# Patient Record
Sex: Female | Born: 1980 | Race: White | Hispanic: No | Marital: Married | Smoking: Never smoker
Health system: Southern US, Community
[De-identification: ages and names within clinical notes are randomized; demographics above are authoritative.]

## PROBLEM LIST (undated history)

## (undated) DIAGNOSIS — F32A Depression, unspecified: Secondary | ICD-10-CM

## (undated) DIAGNOSIS — K625 Hemorrhage of anus and rectum: Secondary | ICD-10-CM

## (undated) DIAGNOSIS — F329 Major depressive disorder, single episode, unspecified: Secondary | ICD-10-CM

## (undated) DIAGNOSIS — N6452 Nipple discharge: Secondary | ICD-10-CM

## (undated) DIAGNOSIS — G10 Huntington's disease: Secondary | ICD-10-CM

## (undated) DIAGNOSIS — F988 Other specified behavioral and emotional disorders with onset usually occurring in childhood and adolescence: Secondary | ICD-10-CM

## (undated) DIAGNOSIS — Z87410 Personal history of cervical dysplasia: Secondary | ICD-10-CM

## (undated) DIAGNOSIS — C801 Malignant (primary) neoplasm, unspecified: Secondary | ICD-10-CM

## (undated) DIAGNOSIS — G3184 Mild cognitive impairment, so stated: Secondary | ICD-10-CM

## (undated) DIAGNOSIS — G44229 Chronic tension-type headache, not intractable: Secondary | ICD-10-CM

## (undated) DIAGNOSIS — G030 Nonpyogenic meningitis: Secondary | ICD-10-CM

## (undated) DIAGNOSIS — M542 Cervicalgia: Secondary | ICD-10-CM

## (undated) DIAGNOSIS — K219 Gastro-esophageal reflux disease without esophagitis: Secondary | ICD-10-CM

## (undated) DIAGNOSIS — R51 Headache: Secondary | ICD-10-CM

## (undated) DIAGNOSIS — R413 Other amnesia: Secondary | ICD-10-CM

## (undated) DIAGNOSIS — R569 Unspecified convulsions: Secondary | ICD-10-CM

## (undated) DIAGNOSIS — G894 Chronic pain syndrome: Secondary | ICD-10-CM

## (undated) DIAGNOSIS — G4701 Insomnia due to medical condition: Secondary | ICD-10-CM

## (undated) DIAGNOSIS — F419 Anxiety disorder, unspecified: Secondary | ICD-10-CM

## (undated) DIAGNOSIS — K6289 Other specified diseases of anus and rectum: Secondary | ICD-10-CM

## (undated) DIAGNOSIS — F319 Bipolar disorder, unspecified: Secondary | ICD-10-CM

## (undated) HISTORY — DX: Bipolar disorder, unspecified: F31.9

## (undated) HISTORY — DX: Personal history of cervical dysplasia: Z87.410

## (undated) HISTORY — DX: Chronic tension-type headache, not intractable: G44.229

## (undated) HISTORY — DX: Nonpyogenic meningitis: G03.0

## (undated) HISTORY — DX: Cervicalgia: M54.2

## (undated) HISTORY — DX: Headache: R51

## (undated) HISTORY — DX: Huntington's disease: G10

## (undated) HISTORY — DX: Other amnesia: R41.3

## (undated) HISTORY — DX: Chronic pain syndrome: G89.4

## (undated) HISTORY — DX: Unspecified convulsions: R56.9

## (undated) HISTORY — DX: Hemorrhage of anus and rectum: K62.5

## (undated) HISTORY — DX: Other specified diseases of anus and rectum: K62.89

## (undated) HISTORY — DX: Anxiety disorder, unspecified: F41.9

## (undated) HISTORY — DX: Mild cognitive impairment, so stated: G31.84

## (undated) HISTORY — DX: Insomnia due to medical condition: G47.01

## (undated) HISTORY — DX: Gastro-esophageal reflux disease without esophagitis: K21.9

## (undated) HISTORY — DX: Malignant (primary) neoplasm, unspecified: C80.1

---
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
## 2004-10-06 ENCOUNTER — Emergency Department: Payer: Self-pay | Admitting: Emergency Medicine

## 2004-11-13 ENCOUNTER — Emergency Department: Payer: Self-pay | Admitting: Emergency Medicine

## 2005-02-10 HISTORY — PX: LEEP: SHX91

## 2007-02-25 ENCOUNTER — Ambulatory Visit: Payer: Self-pay | Admitting: Internal Medicine

## 2009-09-07 ENCOUNTER — Ambulatory Visit: Payer: Self-pay | Admitting: Gynecology

## 2009-09-10 ENCOUNTER — Ambulatory Visit: Payer: Self-pay | Admitting: Gynecology

## 2009-11-21 ENCOUNTER — Ambulatory Visit: Payer: Self-pay | Admitting: Internal Medicine

## 2010-02-10 HISTORY — PX: CHOLECYSTECTOMY: SHX55

## 2010-02-26 ENCOUNTER — Inpatient Hospital Stay: Payer: Self-pay | Admitting: Psychiatry

## 2010-06-26 ENCOUNTER — Ambulatory Visit: Payer: Self-pay | Admitting: Gastroenterology

## 2010-06-27 ENCOUNTER — Observation Stay: Payer: Self-pay | Admitting: Surgery

## 2010-07-02 LAB — PATHOLOGY REPORT

## 2010-08-26 ENCOUNTER — Ambulatory Visit: Payer: Self-pay | Admitting: Gastroenterology

## 2010-10-12 ENCOUNTER — Ambulatory Visit: Payer: Self-pay | Admitting: Physician Assistant

## 2011-02-11 HISTORY — PX: ABDOMINAL HYSTERECTOMY: SHX81

## 2011-04-29 ENCOUNTER — Ambulatory Visit: Payer: Self-pay | Admitting: Obstetrics and Gynecology

## 2011-04-29 LAB — CBC
HCT: 37.4 % (ref 35.0–47.0)
HGB: 12.9 g/dL (ref 12.0–16.0)
MCHC: 34.4 g/dL (ref 32.0–36.0)
MCV: 97 fL (ref 80–100)
RBC: 3.85 10*6/uL (ref 3.80–5.20)
WBC: 9.9 10*3/uL (ref 3.6–11.0)

## 2011-05-05 ENCOUNTER — Ambulatory Visit: Payer: Self-pay | Admitting: Obstetrics and Gynecology

## 2011-05-05 LAB — PREGNANCY, URINE: Pregnancy Test, Urine: NEGATIVE m[IU]/mL

## 2011-06-04 ENCOUNTER — Ambulatory Visit: Payer: Self-pay | Admitting: Obstetrics and Gynecology

## 2011-06-04 LAB — CBC
HCT: 39.8 % (ref 35.0–47.0)
HGB: 13.5 g/dL (ref 12.0–16.0)
Platelet: 307 10*3/uL (ref 150–440)
RDW: 12.5 % (ref 11.5–14.5)

## 2011-06-04 LAB — PREGNANCY, URINE: Pregnancy Test, Urine: NEGATIVE m[IU]/mL

## 2011-06-09 ENCOUNTER — Ambulatory Visit: Payer: Self-pay | Admitting: Obstetrics and Gynecology

## 2011-06-09 LAB — PREGNANCY, URINE: Pregnancy Test, Urine: NEGATIVE m[IU]/mL

## 2011-06-12 LAB — PATHOLOGY REPORT

## 2013-06-21 DIAGNOSIS — K6289 Other specified diseases of anus and rectum: Secondary | ICD-10-CM | POA: Insufficient documentation

## 2013-06-21 DIAGNOSIS — K625 Hemorrhage of anus and rectum: Secondary | ICD-10-CM | POA: Insufficient documentation

## 2014-06-04 NOTE — Op Note (Signed)
PATIENT NAME:  [name], Heather King MR#:  [id number] DATE OF BIRTH:  01-07-1981  DATE OF PROCEDURE:  06/09/2011  PREOPERATIVE DIAGNOSES:  1. Chronic pelvic pain.  2. Endometriosis.   POSTOPERATIVE DIAGNOSES:  1. Chronic pelvic pain.  2. Endometriosis.   OPERATIVE PROCEDURE: Transvaginal hysterectomy.   SURGEON: Alanda Slim. Jaking Thayer, M.D.   FIRST ASSISTANT: None.   ANESTHESIA: General endotracheal.   INDICATIONS: Heather King is a 34 year old married white female, para 1- 0-0-1, who presents for definitive surgery through transvaginal hysterectomy for symptomatic endometriosis. The patient has had chronic pelvic pain with dyspareunia which has been affecting her activities of daily living. She and her husband desire no further children and are willing to proceed with definitive surgery.   FINDINGS AT SURGERY: Small uterus that was normal in overall appearance. The ovaries appeared grossly normal. The pelvis was vascular with a moderate amount of bleeding. The vaginal vault had good support.   DESCRIPTION OF PROCEDURE: The patient was brought to the operating room where she was placed in the supine position. General endotracheal anesthesia was induced without difficulty. She was placed in the dorsal lithotomy position using the candy-cane stirrups. A Betadine perineal/intravaginal prep and drape was performed in the standard fashion. A Foley catheter was placed and the bladder was draining clear yellow urine. A weighted speculum was placed into the vagina and a double-tooth tenaculum was placed onto the cervix. The posterior colpotomy was made with Mayo scissors. The uterosacral ligaments were then clamped, cut, and stick tied using 0 Vicryl suture. These were tagged. Cervix was then circumscribed and the bladder was dissected off the lower uterine segment through sharp and blunt dissection. Sequentially the cardinal/broad ligament complexes were then clamped, cut, and stick tied using 0 Vicryl  suture. This was carried out up to the level of the uterine cornu. Eventually the anterior cul-de-sac was entered. The utero-ovarian ligaments were then crossclamped, cut, and doubly ligated using 0 Vicryl suture. The first tie was a free tie with the following tie being a stick tie. These were tagged. There was some mild bleeding at the uteroovarian pedicles bilaterally and additional sutures of 0 Vicryl were placed to optimize hemostasis. The vaginal cuff was reapproximated using a 0 chromic suture in a baseball stitch manner. Peritoneum was reapproximated using a pursestring stitch of 0 Vicryl. Vagina was then reapproximated using 2-0 chromic sutures in a simple interrupted manner. Upon completion of the procedure, all instrumentation was removed from the vagina. The patient was then mobilized, awakened, and taken to the recovery room in satisfactory condition.  Estimated blood loss was 350 mL. IV fluids were 1300 mL of crystalloid. Urine output was 375 mL of clear urine.    ____________________________ Alanda Slim Wilkin Lippy, MD mad:bjt D: 06/09/2011 14:41:35 ET T: 06/09/2011 16:10:34 ET JOB#: [id number]  cc: Hassell Done A. Tynetta Bachmann, MD, <Dictator> Alanda Slim Demari Gales MD ELECTRONICALLY SIGNED 06/16/2011 22:38

## 2014-06-04 NOTE — H&P (Signed)
PATIENT NAME:  Heather King, Heather King MR#:  [id number] DATE OF BIRTH:  1980/03/10  DATE OF ADMISSION:  06/09/2011  PREOPERATIVE DIAGNOSIS: Symptomatic endometriosis.  HISTORY OF PRESENT ILLNESS: Heather King is a 34 year old married white female, para 1-0-0-1, on Seasonique for contraception and cycle regulation, who presents for definitive surgery for symptomatic endometriosis. The patient has had a long history of chronic pelvic pain manifested by severe dysmenorrhea and deep thrust dyspareunia. Laparoscopy in March of 2013 revealed endometriosis through visual diagnosis with red and black lesions being seen in the cul-de-sac. Pseudo fenestrations were also present as well. Pathology showed some myomatous calcifications without evidence of endometriosis. Because of her pain syndrome, she would like to proceed with definitive surgery. Leeana does have a family history of ovarian cancer in mom. The ovaries appeared grossly normal along with the fallopian tubes at the time of laparoscopy and at this time she would like to maintain ovarian conservation.   PAST MEDICAL HISTORY:  1. Mild anxiety.  2. Dysmenorrhea.  3. Menorrhagia.  4. Cervical dysplasia.  5. Endometriosis.   PAST SURGICAL HISTORY:  1. LEEP in 2007. 2. LEEP in 2012.  3. Laparoscopic cholecystectomy in 2012.  4. Laparoscopy with peritoneal biopsies in 2013.   PAST OB HISTORY: Para 1-0-0-1. Spontaneous vaginal delivery x1 with infant being 9 pounds, 11 ounces.   PAST GYN HISTORY: Cycles are regular with menorrhagia and severe dysmenorrhea. She does have deep thrust dyspareunia. The patient cannot tolerate NuvaRing and Mirena IUD due to side effects.   FAMILY HISTORY: Negative for cancer of the colon. There is history of ovarian cancer in mom who was diagnosed at age 2. There is breast cancer in maternal aunt.   SOCIAL HISTORY: The patient does not smoke. She does drink alcohol socially. She denies drug use. She works as a Research scientist (physical sciences) in the  [employer]   REVIEW OF SYSTEMS: The patient denies recent illness. She denies history of coagulopathy. She denies reactive airway disease.   PHYSICAL EXAMINATION:   VITAL SIGNS: Height 5 feet 2 inches, weight 138, blood pressure 123/76, heart rate 89, and BMI 25.   GENERAL: The patient is a pleasant well-appearing white female with normal affect. She is alert and oriented.   HEENT: Normocephalic, atraumatic. Oropharynx clear.   NECK: Supple. No thyromegaly or adenopathy.   LUNGS: Clear.   HEART: Regular rate and rhythm without murmur.   BREASTS: Not examined.   FLANKS: Without CVA tenderness.   ABDOMEN: Soft, flat, and nontender. No organomegaly. No pelvic masses. No hernias.   PELVIC: External genitalia normal. BUS normal. Vagina has good vault support. Cervix is parous and there is 2 out of 4 cervical motion tenderness. Uterus is midplane, top normal size and mobile. It is 2 out of 4 tender. Adnexa are nonpalpable, nontender.   RECTAL: External rectal exam is normal.   EXTREMITIES: Without clubbing, cyanosis, or edema.   SKIN: Without rash.   MUSCULOSKELETAL: Normal.  IMPRESSION: Symptomatic endometriosis.   PLAN: Transvaginal hysterectomy. Date of surgery is 06/09/2011.   CONSENT NOTE: Heather King is to undergo TVH for symptomatic endometriosis. She is understanding of the planned procedures and is aware of and is accepting of all surgical risks which include but are not limited to bleeding, infection, pelvic organ injury with need for repair, blood clot disorders, anesthesia risks, nerve injury, and death. All questions are answered. Informed consent is given. The patient is ready and willing to proceed with surgery as scheduled. ____________________________ Alanda Slim Jibreel Fedewa,  MD mad:slb D: 06/04/2011 11:22:29 ET T: 06/04/2011 11:32:50 ET JOB#: [id number]  cc: Hassell Done A. Jeorgia Helming, MD, <Dictator> Alanda Slim Marykathryn Carboni  MD ELECTRONICALLY SIGNED 06/06/2011 12:24

## 2014-06-04 NOTE — H&P (Signed)
PATIENT NAME:  Heather King, RHOME MR#:  [id number] DATE OF BIRTH:  12/18/1980  DATE OF ADMISSION:  05/05/2011  PREOPERATIVE DIAGNOSES:  1. Abdominal pelvic pain. 2. Dyspareunia.  3. Dysmenorrhea.   HISTORY: Aide Wojnar is a 34 year old recently remarried white female, para 1-0-0-1, last menstrual period uncertain, using Seasonique for contraception and cycle regulation, presents for surgical evaluation of chronic pelvic pain that has been affecting activities of daily living. The patient does report a long history of severe dysmenorrhea with perimenstrual low back ache that radiates into the legs. She also has had a long history of deep dyspareunia. She states the symptoms are severe enough to affect her relations with her spouse. The patient does have long history of subjective menorrhagia off of oral contraceptives with bleeding that could be upwards of 10-14 days. She did have history of cervical dysplasia that required LEEP procedures in 2007 and 2012. Because of the ongoing pain issues, she desires surgical evaluation to rule out endometriosis. She is not interested in having future children, as she and her current husband have 5 children total. In addition, the patient does have family history of ovarian cancer in mom, being diagnosed at age 106.   PAST MEDICAL HISTORY:  1. Mild anxiety. 2. Dysmenorrhea. 3. Menorrhagia. 4. Cervical dysplasia.   PAST SURGICAL HISTORY:   1. LEEP 2007. 2. LEEP 2012. 3. Laparoscopic cholecystectomy 2012.   PAST OB HISTORY: Para 1-0-0-1, SVB x1 (9 pounds 11 ounces).   PAST GYN HISTORY: Menarche age 34, cycles regular, but with menorrhagia and severe dysmenorrhea. Past history of cervical dysplasia requiring LEEP cone biopsy x2. No history of PID or STIs.   FAMILY HISTORY: Positive for ovarian cancer in mom, diagnosed at age 36. There is breast cancer in maternal aunt. No history of colon cancer.   SOCIAL HISTORY: The patient does not smoke. She does  drink alcohol socially. She denies drug use. 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   REVIEW OF SYSTEMS: The patient reports mild upper respiratory symptoms without fever or cough. She denies reactive airways disease. She denies coagulopathy.   PHYSICAL EXAMINATION:   VITAL SIGNS: Height 5 feet 2 inches, weight 143, BMI 26, blood pressure 131/81, heart rate 93.   GENERAL: The patient is a pleasant well-appearing white female, who is alert and oriented. Affect is appropriate.   HEENT: Oropharynx is clear.   NECK: Supple. No thyromegaly or adenopathy.   LUNGS: Clear.  HEART: Regular rate and rhythm without murmur.   ABDOMEN: Soft, nontender, no organomegaly. No pelvic masses.   PELVIC EXAM: External genitalia normal. BUS normal. Vagina with good vault support. There is no significant discharge present. Cervix is parous. Cervix is 2/4 tender with movement. Uterus is mid-plane, top normal size, and mobile, and slightly tender, 2/4. Adnexa are nonpalpable, nontender.   RECTAL EXAM: Normal sphincter tone, no rectal masses. There is posterior uterine tenderness present on palpation without obvious nodularity. The bony pelvis is gynecoid.   EXTREMITIES: Without clubbing, cyanosis, or edema.   SKIN:  Without rash.   MUSCULOSKELETAL:  Exam is normal.   IMPRESSION:    1. Chronic pelvic pain affecting activities of daily living, suspicious for endometriosis.  2. Family history of ovarian cancer.   PLAN:  1. Laparoscopy with peritoneal biopsies. Date of surgery is 05/05/2011.  2. Consent note: Madelein Mahadeo is to undergo laparoscopy with peritoneal biopsies for evaluation of chronic pelvic pain. She is understanding of the planned procedure and is aware  of it and is accepting of all surgical risks, which include, but are not limited to, bleeding, infection, pelvic organ injury with need for repair, blood clot disorders, anesthesia risks, and death. All questions are  answered. Informed consent is given. The patient is ready and willing to proceed with surgery as scheduled.   ____________________________ Alanda Slim Tranisha Tissue, MD mad:mln D: 04/29/2011 11:23:58 ET T: 04/29/2011 11:54:18 ET JOB#: [id number]  cc: Hassell Done A. Kim Oki, MD, <Dictator> Alanda Slim Claudine Stallings MD ELECTRONICALLY SIGNED 05/03/2011 11:46

## 2014-06-04 NOTE — Op Note (Signed)
PATIENT NAME:  Heather King, Heather King MR#:  [id number] DATE OF BIRTH:  1980/12/24  DATE OF PROCEDURE:  05/05/2011  PREOPERATIVE DIAGNOSIS: Chronic pelvic pain (severe dysmenorrhea, deep thrust dyspareunia).   POSTOPERATIVE DIAGNOSES:  1. Chronic pelvic pain (severe dysmenorrhea, deep thrust dyspareunia).  2. Endometriosis.   OPERATIVE PROCEDURE: Laparoscopy with peritoneal biopsies and fulguration of endometriosis.   SURGEON: Alanda Slim. Jazzmon Prindle, MD   FIRST ASSISTANT: None.   ANESTHESIA: General endotracheal.   INDICATIONS: Heather King is a 34 year old married white female, para 1-0-0-1, who presents for surgical evaluation of incapacitating chronic pelvic pain. Severe dysmenorrhea and deep thrust dyspareunia have affected relations with her spouse. The patient has a long history of menorrhagia as well. The patient desires definitive assessment for possible endometriosis.   FINDINGS AT SURGERY: Findings at surgery revealed the patient had a gynecoid pelvis. Uterus is small, mobile, and without masses. Laparoscopy revealed grossly normal uterus, tubes, and ovaries. There were endometriosis implants in the cul-de-sac centrally with red and brown lesions being identified along with a small black bleb consistent with endometriosis. In addition, there was some burgundy fluid in the anterior cul-de-sac as well as in the cul-de-sac. There was a question of some hyperemia in the right and left ovarian fossa without obvious stigmata of endometriosis. Bladder flap looked normal. Upper abdomen was normal.   DESCRIPTION OF PROCEDURE: The patient was brought to the operating room where she was placed in the supine position. General endotracheal anesthesia was induced without difficulty. She was placed in the low lithotomy position using the bumblebee stirrups. A ChloraPrep and Betadine abdominal, perineal, intravaginal prep and drape was performed in the standard fashion. Red Robinson catheter was used to drain  200 mL of urine from the bladder. Hulka tenaculum was placed onto the cervix for manipulation. A subumbilical vertical 5 mm incision was made in the skin. The Optiview laparoscopic trocar system was used to place the 5 mm ports directly into the abdominal pelvic cavity without evidence of bowel or vascular injury. A second 5 mm port was placed in the suprapubic region in the midline two fingerbreadths above the symphysis pubis under direct visualization. The above-noted findings were photo documented. The pelvis was irrigated with normal saline with the irrigant fluid being aspirated with a syringe. The cul-de-sac abnormalities were then biopsied using biopsy forceps. This area was then cauterized using the Kleppinger bipolar forceps. Upon verifying good hemostasis, the procedure was terminated with the release of the CO2 pneumoperitoneum. The incisions were then closed with Dermabond adhesive in standard fashion.   The patient was then awakened, extubated, and taken to the recovery room in satisfactory condition. Estimated blood loss minimal. Complications were none. All instruments, needle, and sponge counts were verified as correct.    ____________________________ Alanda Slim. Deveney Bayon, MD mad:drc D: 05/06/2011 14:12:26 ET T: 05/06/2011 14:59:04 ET JOB#: [id number]  cc: Hassell Done A. Ottilie Wigglesworth, MD, <Dictator> Alanda Slim Tamina Cyphers MD ELECTRONICALLY SIGNED 05/10/2011 9:17

## 2014-10-05 ENCOUNTER — Ambulatory Visit: Payer: Self-pay | Admitting: Obstetrics and Gynecology

## 2015-01-16 ENCOUNTER — Observation Stay
Admission: EM | Admit: 2015-01-16 | Discharge: 2015-01-18 | Disposition: A | Discharge status: Home / Self Care | Payer: Medicaid Other | Attending: Specialist | Admitting: Specialist

## 2015-01-16 ENCOUNTER — Encounter: Payer: Self-pay | Admitting: *Deleted

## 2015-01-16 ENCOUNTER — Emergency Department: Payer: Medicaid Other

## 2015-01-16 DIAGNOSIS — M436 Torticollis: Secondary | ICD-10-CM | POA: Insufficient documentation

## 2015-01-16 DIAGNOSIS — R112 Nausea with vomiting, unspecified: Secondary | ICD-10-CM | POA: Insufficient documentation

## 2015-01-16 DIAGNOSIS — R509 Fever, unspecified: Secondary | ICD-10-CM | POA: Insufficient documentation

## 2015-01-16 DIAGNOSIS — Z79899 Other long term (current) drug therapy: Secondary | ICD-10-CM | POA: Insufficient documentation

## 2015-01-16 DIAGNOSIS — R55 Syncope and collapse: Secondary | ICD-10-CM | POA: Insufficient documentation

## 2015-01-16 DIAGNOSIS — F988 Other specified behavioral and emotional disorders with onset usually occurring in childhood and adolescence: Secondary | ICD-10-CM | POA: Insufficient documentation

## 2015-01-16 DIAGNOSIS — R519 Headache, unspecified: Secondary | ICD-10-CM

## 2015-01-16 DIAGNOSIS — M542 Cervicalgia: Secondary | ICD-10-CM | POA: Insufficient documentation

## 2015-01-16 DIAGNOSIS — A879 Viral meningitis, unspecified: Secondary | ICD-10-CM | POA: Diagnosis present

## 2015-01-16 DIAGNOSIS — F329 Major depressive disorder, single episode, unspecified: Secondary | ICD-10-CM | POA: Insufficient documentation

## 2015-01-16 DIAGNOSIS — R42 Dizziness and giddiness: Secondary | ICD-10-CM | POA: Insufficient documentation

## 2015-01-16 DIAGNOSIS — H53149 Visual discomfort, unspecified: Secondary | ICD-10-CM | POA: Insufficient documentation

## 2015-01-16 DIAGNOSIS — R197 Diarrhea, unspecified: Secondary | ICD-10-CM | POA: Insufficient documentation

## 2015-01-16 DIAGNOSIS — R51 Headache: Principal | ICD-10-CM | POA: Insufficient documentation

## 2015-01-16 DIAGNOSIS — Z833 Family history of diabetes mellitus: Secondary | ICD-10-CM | POA: Insufficient documentation

## 2015-01-16 DIAGNOSIS — Z90710 Acquired absence of both cervix and uterus: Secondary | ICD-10-CM | POA: Insufficient documentation

## 2015-01-16 DIAGNOSIS — Z9049 Acquired absence of other specified parts of digestive tract: Secondary | ICD-10-CM | POA: Insufficient documentation

## 2015-01-16 DIAGNOSIS — Z8249 Family history of ischemic heart disease and other diseases of the circulatory system: Secondary | ICD-10-CM | POA: Insufficient documentation

## 2015-01-16 HISTORY — DX: Other specified behavioral and emotional disorders with onset usually occurring in childhood and adolescence: F98.8

## 2015-01-16 HISTORY — DX: Major depressive disorder, single episode, unspecified: F32.9

## 2015-01-16 HISTORY — DX: Depression, unspecified: F32.A

## 2015-01-16 LAB — BASIC METABOLIC PANEL
Anion gap: 7 (ref 5–15)
BUN: 9 mg/dL (ref 6–20)
CALCIUM: 9.1 mg/dL (ref 8.9–10.3)
CHLORIDE: 105 mmol/L (ref 101–111)
CO2: 27 mmol/L (ref 22–32)
CREATININE: 0.66 mg/dL (ref 0.44–1.00)
GFR calc non Af Amer: >60 mL/min (ref >60)
Glucose, Bld: 103 mg/dL — ABNORMAL HIGH (ref 65–99)
Potassium: 3.6 mmol/L (ref 3.5–5.1)
SODIUM: 139 mmol/L (ref 135–145)

## 2015-01-16 LAB — CBC
HCT: 38.6 % (ref 35.0–47.0)
Hemoglobin: 13.2 g/dL (ref 12.0–16.0)
MCH: 32.5 pg (ref 26.0–34.0)
MCHC: 34.3 g/dL (ref 32.0–36.0)
MCV: 94.8 fL (ref 80.0–100.0)
PLATELETS: 158 10*3/uL (ref 150–440)
RBC: 4.07 MIL/uL (ref 3.80–5.20)
RDW: 12.2 % (ref 11.5–14.5)
WBC: 6 10*3/uL (ref 3.6–11.0)

## 2015-01-16 MED ORDER — MAGNESIUM SULFATE IN D5W 10-5 MG/ML-% IV SOLN: 1.0000 g | Freq: Once | INTRAVENOUS | Status: DC

## 2015-01-16 MED ORDER — KETOROLAC TROMETHAMINE 30 MG/ML IJ SOLN
30.0000 mg | Freq: Once | INTRAMUSCULAR | Status: AC
  Administered 2015-01-16: 30 mg via INTRAVENOUS
  Filled 2015-01-16: qty 1

## 2015-01-16 MED ORDER — MAGNESIUM SULFATE IN D5W 10-5 MG/ML-% IV SOLN
1.0000 g | Freq: Once | INTRAVENOUS | Status: AC
  Administered 2015-01-16: 1 g via INTRAVENOUS
  Filled 2015-01-16: qty 100

## 2015-01-16 MED ORDER — DIPHENHYDRAMINE HCL 50 MG/ML IJ SOLN
25.0000 mg | Freq: Once | INTRAMUSCULAR | Status: AC
  Administered 2015-01-16: 25 mg via INTRAVENOUS
  Filled 2015-01-16: qty 1

## 2015-01-16 MED ORDER — DEXAMETHASONE SODIUM PHOSPHATE 10 MG/ML IJ SOLN
10.0000 mg | Freq: Once | INTRAMUSCULAR | Status: AC
  Administered 2015-01-16: 10 mg via INTRAVENOUS

## 2015-01-16 MED ORDER — ONDANSETRON HCL 4 MG/2ML IJ SOLN
4.0000 mg | Freq: Once | INTRAMUSCULAR | Status: AC
  Administered 2015-01-16: 4 mg via INTRAVENOUS
  Filled 2015-01-16: qty 2

## 2015-01-16 MED ORDER — SODIUM CHLORIDE 0.9 % IV BOLUS (SEPSIS)
1000.0000 mL | Freq: Once | INTRAVENOUS | Status: AC
  Administered 2015-01-16: 1000 mL via INTRAVENOUS

## 2015-01-16 MED ORDER — DEXAMETHASONE SODIUM PHOSPHATE 10 MG/ML IJ SOLN
INTRAMUSCULAR | Status: AC
  Filled 2015-01-16: qty 1

## 2015-01-16 MED ORDER — MORPHINE SULFATE (PF) 4 MG/ML IV SOLN
4.0000 mg | Freq: Once | INTRAVENOUS | Status: AC
  Administered 2015-01-16: 4 mg via INTRAVENOUS
  Filled 2015-01-16: qty 1

## 2015-01-16 NOTE — ED Notes (Signed)
Pt ambulated to restroom with steady gait.

## 2015-01-16 NOTE — ED Notes (Addendum)
Pt reports a headache for 5 days.  Taking otc meds without relief.   States head hurts on top of head radiating into neck and shoulders.   Pt reports today she passed out and woke up with the dog licking her face.  Pt alert.  Speech clear.

## 2015-01-16 NOTE — Discharge Instructions (Signed)
You were evaluated for headache, and although no certain cause was found, your exam and evaluation are reassuring. You were treated for nonspecific headache in the emergency department. Return to the emergency department for any worsening headache, confusion, weakness, numbness, altered mental status, seizure, or any other symptoms concerning to you. I recommend follow with a primary care physician, and possibly neurologist.   General Headache Without Cause A headache is pain or discomfort felt around the head or neck area. There are many causes and types of headaches. In some cases, the cause may not be found.  HOME CARE  Managing Pain  Take over-the-counter and prescription medicines only as told by your doctor.  Lie down in a dark, quiet room when you have a headache.  If directed, apply ice to the head and neck area:  Put ice in a plastic bag.  Place a towel between your skin and the bag.  Leave the ice on for 20 minutes, 2-3 times per day.  Use a heating pad or hot shower to apply heat to the head and neck area as told by your doctor.  Keep lights dim if bright lights bother you or make your headaches worse. Eating and Drinking  Eat meals on a regular schedule.  Lessen how much alcohol you drink.  Lessen how much caffeine you drink, or stop drinking caffeine. General Instructions  Keep all follow-up visits as told by your doctor. This is important.  Keep a journal to find out if certain things bring on headaches. For example, write down:  What you eat and drink.  How much sleep you get.  Any change to your diet or medicines.  Relax by getting a massage or doing other relaxing activities.  Lessen stress.  Sit up straight. Do not tighten (tense) your muscles.  Do not use tobacco products. This includes cigarettes, chewing tobacco, or e-cigarettes. If you need help quitting, ask your doctor.  Exercise regularly as told by your doctor.  Get enough sleep. This  often means 7-9 hours of sleep. GET HELP IF:  Your symptoms are not helped by medicine.  You have a headache that feels different than the other headaches.  You feel sick to your stomach (nauseous) or you throw up (vomit).  You have a fever. GET HELP RIGHT AWAY IF:   Your headache becomes really bad.  You keep throwing up.  You have a stiff neck.  You have trouble seeing.  You have trouble speaking.  You have pain in the eye or ear.  Your muscles are weak or you lose muscle control.  You lose your balance or have trouble walking.  You feel like you will pass out (faint) or you pass out.  You have confusion.   This information is not intended to replace advice given to you by your health care provider. Make sure you discuss any questions you have with your health care provider.   Document Released: 11/06/2007 Document Revised: 10/18/2014 Document Reviewed: 05/22/2014 Elsevier Interactive Patient Education [employer].

## 2015-01-16 NOTE — ED Notes (Signed)
Pt ambulatory with steady gait to treatment room. Pt c/o headache, bilateral hand numbness, and dizziness since 12/1, began to radiate to neck and lower back on 01/13/15. Pt reports took otc meds without relief. States head hurts on top of head. Pt reports today she passed out and woke up with the dog licking her face, denies hitting head. Pt denies chest pain, abdominal pain, shortness of breath, or fever. Pt alert and oriented x 4, respirations even and unlabored. Pt speaking in complete and clear sentences. (+) equal bilateral grips.

## 2015-01-16 NOTE — ED Notes (Signed)
Pt in CT at this time.

## 2015-01-16 NOTE — ED Provider Notes (Signed)
[facility] Emergency Department Provider Note   ____________________________________________  Time seen:  I have reviewed the triage vital signs and the triage nursing note.  HISTORY  Chief Complaint Headache and Loss of Consciousness   Historian Patient  HPI Heather King is a 34 y.o. female here for evaluation of headache.  Started gradually and has become worse over 5 days.  Waxing and waning.  No trauma.  Not light sensitive.  No hx of frequent or chronic headaches.  Located at top of head and then extends downward to neck which is tight.  No current sinus congestion, chest discomfort, trouble breathing, or fever.  No weakness or numbness or confusion.  Some lightheaded ness and dizziness.  Tonight felt dizzy upon standing then woke up to dog licking her face after syncope.  No hx of syncope. Headache at present is severe.   No past medical history on file.  There are no active problems to display for this patient.   No past surgical history on file.  No current outpatient prescriptions on file.  Allergies Review of patient's allergies indicates no known allergies.  No family history on file.  Social History Social History  Substance Use Topics  . Smoking status: Never Smoker   . Smokeless tobacco: Not on file  . Alcohol Use: No    Review of Systems  Constitutional: Negative for fever. Eyes: Negative for visual changes. ENT: Negative for sore throat. Cardiovascular: Negative for chest pain. Respiratory: Negative for shortness of breath. Gastrointestinal: Negative for abdominal pain, vomiting and diarrhea. Genitourinary: Negative for dysuria. Musculoskeletal: Negative for back pain. Skin: Negative for rash. Neurological: Per HPI. 10 point Review of Systems otherwise negative ____________________________________________   PHYSICAL EXAM:  VITAL SIGNS: ED Triage Vitals  Enc Vitals Group     BP 01/16/15 1938 117/70 mmHg     Pulse  Rate 01/16/15 1938 87     Resp 01/16/15 1938 20     Temp 01/16/15 1938 98.7 F (37.1 C)     Temp Source 01/16/15 1938 Oral     SpO2 01/16/15 1938 99 %     Weight 01/16/15 1938 132 lb (59.875 kg)     Height 01/16/15 1938 5\' 2"  (1.575 m)     Head Cir --      Peak Flow --      Pain Score 01/16/15 1939 10     Pain Loc --      Pain Edu? --      Excl. in [place]? --      Constitutional: Alert and oriented. Well appearing overall but tearful due to pain. Eyes: Conjunctivae are normal. PERRL. Fundoscopic exam normal.  Normal extraocular movements. ENT   Head: Normocephalic and atraumatic.   Nose: No congestion/rhinnorhea.   Mouth/Throat: Mucous membranes are moist.   Neck: No stridor. Cardiovascular/Chest: Normal rate, regular rhythm.  No murmurs, rubs, or gallops. Respiratory: Normal respiratory effort without tachypnea nor retractions. Breath sounds are clear and equal bilaterally. No wheezes/rales/rhonchi. Gastrointestinal: Soft. No distention, no guarding, no rebound. Nontender  Genitourinary/rectal:Deferred Musculoskeletal: Nontender with normal range of motion in all extremities. No joint effusions.  No lower extremity tenderness.  No edema. Neurologic:  Normal speech and language. No gross or focal neurologic deficits are appreciated.  CN 2-10 intact.  5/5 strength 4 extremities. Coordination intact. Skin:  Skin is warm, dry and intact. No rash noted. Psychiatric: Mood and affect are normal. Speech and behavior are normal. Patient exhibits appropriate insight and judgment.  ____________________________________________   EKG I, Lisa Roca, MD, the attending physician have personally viewed and interpreted all ECGs. Nsr.  88bpm.  Nl axis.  Narrow qrs.  Nl st and t wave ____________________________________________  LABS (pertinent positives/negatives)  Basic metabolic panel and CBC within normal limits  ____________________________________________  RADIOLOGY All  Xrays were viewed by me. Imaging interpreted by Radiologist.  CT head noncontrast:  Normal head CT __________________________________________  PROCEDURES  Procedure(s) performed: None  Critical Care performed: None  ____________________________________________   ED COURSE / ASSESSMENT AND PLAN  CONSULTATIONS: None  Pertinent labs & imaging results that were available during my care of the patient were reviewed by me and considered in my medical decision making (see chart for details).   Patient had gradual onset but worsening headache over 5 days and no history of prior chronic or frequent headaches. Neurologic exam is intact. Clinical history does not seem consistent with subarachnoid hemorrhage. However given the new onset headache which is now severe, I discussed obtaining head CT to rule out intracranial abnormalities such as tumor, and patient is in agreement.  Do not suspect an infectious etiology. I will treat symptomatically here in the ED for nonspecific headache.  I will refer the patient back to see primary care physician and discussed the possibility of a neurology referral given the new severe headache.  Initially patient was given IV Toradol, Benadryl, and Zofran as well as normal saline bolus. Patient states headache was no better.  Next patient was given morphine, Decadron and magnesium.  Patient care transferred to Dr. Dahlia Client.  Reassess headache prior to discharge.  If no changes, may be discharged with my discharge instructions.  Patient / Family / Caregiver informed of clinical course, medical decision-making process, and agree with plan.   I discussed return precautions, follow-up instructions, and discharged instructions with patient and/or family.  ___________________________________________   FINAL CLINICAL IMPRESSION(S) / ED DIAGNOSES   Final diagnoses:  Headache, unspecified headache type       Lisa Roca, MD 01/16/15 2302

## 2015-01-17 ENCOUNTER — Observation Stay: Payer: Medicaid Other

## 2015-01-17 DIAGNOSIS — A879 Viral meningitis, unspecified: Secondary | ICD-10-CM | POA: Diagnosis present

## 2015-01-17 LAB — CREATININE, SERUM
Creatinine, Ser: 0.49 mg/dL (ref 0.44–1.00)
GFR calc Af Amer: >60 mL/min (ref >60)
GFR calc non Af Amer: >60 mL/min (ref >60)

## 2015-01-17 LAB — CBC
HCT: 34.2 % — ABNORMAL LOW (ref 35.0–47.0)
Hemoglobin: 11.5 g/dL — ABNORMAL LOW (ref 12.0–16.0)
MCH: 32.1 pg (ref 26.0–34.0)
MCHC: 33.8 g/dL (ref 32.0–36.0)
MCV: 95.1 fL (ref 80.0–100.0)
Platelets: 155 10*3/uL (ref 150–440)
RBC: 3.59 MIL/uL — ABNORMAL LOW (ref 3.80–5.20)
RDW: 12.4 % (ref 11.5–14.5)
WBC: 7.1 10*3/uL (ref 3.6–11.0)

## 2015-01-17 LAB — CSF CELL COUNT WITH DIFFERENTIAL
EOS CSF: 0 %
Eosinophils, CSF: 0 %
LYMPHS CSF: 0 %
Lymphs, CSF: 84 %
MONOCYTE-MACROPHAGE-SPINAL FLUID: 0 %
MONOCYTE-MACROPHAGE-SPINAL FLUID: 11 %
OTHER CELLS CSF: 0
OTHER CELLS CSF: 0
RBC COUNT CSF: 120 /mm3 — AB (ref 0–3)
RBC Count, CSF: 0 /mm3 (ref 0–3)
SEGMENTED NEUTROPHILS-CSF: 0 %
Segmented Neutrophils-CSF: 5 %
TUBE #: 1
Tube #: 3
WBC CSF: 0 /mm3
WBC CSF: 8 /mm3

## 2015-01-17 LAB — GLUCOSE, CSF: GLUCOSE CSF: 73 mg/dL — AB (ref 40–70)

## 2015-01-17 LAB — TSH: TSH: 1.839 u[IU]/mL (ref 0.350–4.500)

## 2015-01-17 LAB — PROTEIN, CSF: TOTAL PROTEIN, CSF: 22 mg/dL (ref 15–45)

## 2015-01-17 LAB — HEMOGLOBIN A1C: Hgb A1c MFr Bld: 4.8 % (ref 4.0–6.0)

## 2015-01-17 MED ORDER — VENLAFAXINE HCL ER 75 MG PO CP24
150.0000 mg | ORAL_CAPSULE | Freq: Every day | ORAL | Status: DC
  Administered 2015-01-17 – 2015-01-18 (×2): 150 mg via ORAL
  Filled 2015-01-17 (×2): qty 2

## 2015-01-17 MED ORDER — VALPROATE SODIUM 500 MG/5ML IV SOLN
500.0000 mg | Freq: Once | INTRAVENOUS | Status: AC
  Administered 2015-01-17: 11:00:00 500 mg via INTRAVENOUS
  Filled 2015-01-17: qty 5

## 2015-01-17 MED ORDER — DIPHENHYDRAMINE HCL 50 MG/ML IJ SOLN
12.5000 mg | Freq: Four times a day (QID) | INTRAMUSCULAR | Status: DC
  Administered 2015-01-17 – 2015-01-18 (×5): 12.5 mg via INTRAVENOUS
  Filled 2015-01-17 (×5): qty 1

## 2015-01-17 MED ORDER — VENLAFAXINE HCL ER 75 MG PO CP24
75.0000 mg | ORAL_CAPSULE | Freq: Every day | ORAL | Status: DC
  Administered 2015-01-17: 22:00:00 75 mg via ORAL
  Filled 2015-01-17 (×2): qty 1

## 2015-01-17 MED ORDER — MAGNESIUM SULFATE 50 % IJ SOLN
0.5000 g | Freq: Four times a day (QID) | INTRAVENOUS | Status: AC
  Administered 2015-01-17 – 2015-01-18 (×4): 0.5 g via INTRAVENOUS
  Filled 2015-01-17 (×4): qty 1

## 2015-01-17 MED ORDER — GADOBENATE DIMEGLUMINE 529 MG/ML IV SOLN
15.0000 mL | Freq: Once | INTRAVENOUS | Status: AC | PRN
  Administered 2015-01-17: 12 mL via INTRAVENOUS

## 2015-01-17 MED ORDER — LORAZEPAM 1 MG PO TABS
1.0000 mg | ORAL_TABLET | Freq: Once | ORAL | Status: AC
  Administered 2015-01-17: 1 mg via ORAL
  Filled 2015-01-17: qty 1

## 2015-01-17 MED ORDER — HYDROMORPHONE HCL 1 MG/ML IJ SOLN
0.5000 mg | Freq: Once | INTRAMUSCULAR | Status: AC
  Administered 2015-01-17: 11:00:00 0.5 mg via INTRAVENOUS
  Filled 2015-01-17: qty 1

## 2015-01-17 MED ORDER — ONDANSETRON HCL 4 MG PO TABS: 4.0000 mg | ORAL_TABLET | Freq: Four times a day (QID) | ORAL | Status: DC | PRN

## 2015-01-17 MED ORDER — GADOBENATE DIMEGLUMINE 529 MG/ML IV SOLN: 15.0000 mL | Freq: Once | INTRAVENOUS | Status: DC | PRN

## 2015-01-17 MED ORDER — BUTALBITAL-APAP-CAFFEINE 50-325-40 MG PO TABS
2.0000 | ORAL_TABLET | Freq: Four times a day (QID) | ORAL | Status: DC | PRN
  Administered 2015-01-17 – 2015-01-18 (×2): 2 via ORAL
  Filled 2015-01-17 (×2): qty 2

## 2015-01-17 MED ORDER — SODIUM CHLORIDE 0.9 % IV BOLUS (SEPSIS)
1000.0000 mL | Freq: Once | INTRAVENOUS | Status: AC
  Administered 2015-01-17: 1000 mL via INTRAVENOUS

## 2015-01-17 MED ORDER — ONDANSETRON HCL 4 MG/2ML IJ SOLN: 4.0000 mg | Freq: Four times a day (QID) | INTRAMUSCULAR | Status: DC | PRN

## 2015-01-17 MED ORDER — HYDROMORPHONE HCL 1 MG/ML IJ SOLN
0.5000 mg | INTRAMUSCULAR | Status: DC | PRN
  Administered 2015-01-17: 08:00:00 0.5 mg via INTRAVENOUS
  Filled 2015-01-17: qty 1

## 2015-01-17 MED ORDER — SODIUM CHLORIDE 0.9 % IV SOLN
INTRAVENOUS | Status: DC
  Administered 2015-01-17 – 2015-01-18 (×2): via INTRAVENOUS

## 2015-01-17 MED ORDER — DOCUSATE SODIUM 100 MG PO CAPS
100.0000 mg | ORAL_CAPSULE | Freq: Two times a day (BID) | ORAL | Status: DC
  Administered 2015-01-17: 100 mg via ORAL
  Filled 2015-01-17 (×2): qty 1

## 2015-01-17 MED ORDER — KETOROLAC TROMETHAMINE 30 MG/ML IJ SOLN
30.0000 mg | Freq: Four times a day (QID) | INTRAMUSCULAR | Status: DC
  Administered 2015-01-17 – 2015-01-18 (×4): 30 mg via INTRAVENOUS
  Filled 2015-01-17 (×5): qty 1

## 2015-01-17 MED ORDER — ZOLPIDEM TARTRATE 5 MG PO TABS
5.0000 mg | ORAL_TABLET | Freq: Every day | ORAL | Status: DC
  Administered 2015-01-17: 5 mg via ORAL
  Filled 2015-01-17: qty 1

## 2015-01-17 MED ORDER — PANTOPRAZOLE SODIUM 40 MG PO TBEC
40.0000 mg | DELAYED_RELEASE_TABLET | Freq: Two times a day (BID) | ORAL | Status: DC
  Administered 2015-01-17 – 2015-01-18 (×3): 40 mg via ORAL
  Filled 2015-01-17 (×3): qty 1

## 2015-01-17 MED ORDER — HYDROMORPHONE HCL 1 MG/ML IJ SOLN
1.0000 mg | Freq: Once | INTRAMUSCULAR | Status: DC
  Filled 2015-01-17: qty 1

## 2015-01-17 MED ORDER — ACETAMINOPHEN 650 MG RE SUPP
650.0000 mg | Freq: Four times a day (QID) | RECTAL | Status: DC | PRN
Start: 2015-01-17 — End: 2015-01-18

## 2015-01-17 MED ORDER — ACETAMINOPHEN 325 MG PO TABS: 650.0000 mg | ORAL_TABLET | Freq: Four times a day (QID) | ORAL | Status: DC | PRN

## 2015-01-17 MED ORDER — BUTALBITAL-APAP-CAFFEINE 50-325-40 MG PO TABS
2.0000 | ORAL_TABLET | Freq: Once | ORAL | Status: AC
  Administered 2015-01-17: 2 via ORAL
  Filled 2015-01-17: qty 2

## 2015-01-17 MED ORDER — METOCLOPRAMIDE HCL 5 MG/ML IJ SOLN
5.0000 mg | Freq: Four times a day (QID) | INTRAMUSCULAR | Status: DC
  Administered 2015-01-17 – 2015-01-18 (×4): 5 mg via INTRAVENOUS
  Filled 2015-01-17 (×5): qty 2

## 2015-01-17 MED ORDER — HEPARIN SODIUM (PORCINE) 5000 UNIT/ML IJ SOLN
5000.0000 [IU] | Freq: Three times a day (TID) | INTRAMUSCULAR | Status: DC
  Administered 2015-01-17 – 2015-01-18 (×4): 5000 [IU] via SUBCUTANEOUS
  Filled 2015-01-17 (×4): qty 1

## 2015-01-17 NOTE — H&P (Signed)
Heather King is an 34 y.o. female.    Chief Complaint: Headache HPI: The patient presents to the emergency department complaining of headache 1 week. The patient admits to having photophobia as well as fever at home up to 101F. She denies nausea, vomiting or diarrhea. She denies sick contacts as well. In the emergency department patient underwent a number puncture that did not show evidence of bacterial infection. Due to ongoing headache and meningitic symptoms emergency department staff called for admission.  Past Medical History  Diagnosis Date  . ADD (attention deficit disorder)   . Depression     Past Surgical History  Procedure Laterality Date  . Abdominal hysterectomy    . Cholecystectomy      Family History  Problem Relation Age of Onset  . Diabetes Mellitus II Mother   . Hypertension Mother    Social History:  reports that she has never smoked. She does not have any smokeless tobacco history on file. She reports that she does not drink alcohol or use illicit drugs.  Allergies: No Known Allergies  Prior to Admission medications   Medication Sig Start Date End Date Taking? Authorizing Provider  amphetamine-dextroamphetamine (ADDERALL) 20 MG tablet Take 20 mg by mouth 2 (two) times daily.   Yes Historical Provider, MD  omeprazole (PRILOSEC) 20 MG capsule Take 20 mg by mouth 2 (two) times daily.   Yes Historical Provider, MD  venlafaxine XR (EFFEXOR-XR) 150 MG 24 hr capsule Take 150 mg by mouth daily.   Yes Historical Provider, MD  venlafaxine XR (EFFEXOR-XR) 75 MG 24 hr capsule Take 75 mg by mouth daily.   Yes Historical Provider, MD  zolpidem (AMBIEN) 5 MG tablet Take 5 mg by mouth at bedtime.   Yes Historical Provider, MD     Results for orders placed or performed during the hospital encounter of 01/16/15 (from the past 48 hour(s))  Basic metabolic panel     Status: Abnormal   Collection Time: 01/16/15  8:03 PM  Result Value Ref Range   Sodium 139 135 - 145 mmol/L   Potassium 3.6 3.5 - 5.1 mmol/L   Chloride 105 101 - 111 mmol/L   CO2 27 22 - 32 mmol/L   Glucose, Bld 103 (H) 65 - 99 mg/dL   BUN 9 6 - 20 mg/dL   Creatinine, Ser 0.66 0.44 - 1.00 mg/dL   Calcium 9.1 8.9 - 10.3 mg/dL   GFR calc non Af Amer >60 >60 mL/min   GFR calc Af Amer >60 >60 mL/min    Comment: (NOTE) The eGFR has been calculated using the CKD EPI equation. This calculation has not been validated in all clinical situations. eGFR's persistently <60 mL/min signify possible Chronic Kidney Disease.    Anion gap 7 5 - 15  CBC     Status: None   Collection Time: 01/16/15  8:03 PM  Result Value Ref Range   WBC 6.0 3.6 - 11.0 K/uL   RBC 4.07 3.80 - 5.20 MIL/uL   Hemoglobin 13.2 12.0 - 16.0 g/dL   HCT 38.6 35.0 - 47.0 %   MCV 94.8 80.0 - 100.0 fL   MCH 32.5 26.0 - 34.0 pg   MCHC 34.3 32.0 - 36.0 g/dL   RDW 12.2 11.5 - 14.5 %   Platelets 158 150 - 440 K/uL  CSF cell count with differential collection tube #: 1 and 3     Status: Abnormal   Collection Time: 01/17/15  4:14 AM  Result Value Ref Range  Tube # 1 and 3    Color, CSF COLORLESS COLORLESS   Appearance, CSF CLEAR (A) CLEAR   Supernatant CLEAR    RBC Count, CSF 0 0 - 3 /cu mm   WBC, CSF 0 /cu mm   Segmented Neutrophils-CSF 0 %   Lymphs, CSF 0 %   Monocyte-Macrophage-Spinal Fluid 0 %   Eosinophils, CSF 0 %   Other Cells, CSF 0   Glucose, CSF     Status: Abnormal   Collection Time: 01/17/15  4:14 AM  Result Value Ref Range   Glucose, CSF 73 (H) 40 - 70 mg/dL  Protein, CSF     Status: None   Collection Time: 01/17/15  4:14 AM  Result Value Ref Range   Total  Protein, CSF 22 15 - 45 mg/dL   Ct Head Wo Contrast  01/16/2015  CLINICAL DATA:  Headache, dizziness. EXAM: CT HEAD WITHOUT CONTRAST TECHNIQUE: Contiguous axial images were obtained from the base of the skull through the vertex without intravenous contrast. COMPARISON:  CT scan of November 21, 2009. FINDINGS: Bony calvarium appears intact. No mass effect or  midline shift is noted. Ventricular size is within normal limits. There is no evidence of mass lesion, hemorrhage or acute infarction. IMPRESSION: Normal head CT. Electronically Signed   By: Marijo Conception, M.D.   On: 01/16/2015 21:41    Review of Systems  Constitutional: Positive for fever. Negative for chills.  HENT: Negative for sore throat and tinnitus.   Eyes: Positive for photophobia. Negative for blurred vision and redness.  Respiratory: Negative for cough and shortness of breath.   Cardiovascular: Negative for chest pain, palpitations, orthopnea and PND.  Gastrointestinal: Negative for nausea, vomiting, abdominal pain and diarrhea.  Genitourinary: Negative for dysuria, urgency and frequency.  Musculoskeletal: Positive for neck pain. Negative for myalgias and joint pain.  Skin: Negative for rash.       No lesions  Neurological: Positive for headaches. Negative for speech change, focal weakness and weakness.  Endo/Heme/Allergies: Does not bruise/bleed easily.       No temperature intolerance  Psychiatric/Behavioral: Negative for depression and suicidal ideas.    Blood pressure 100/69, pulse 75, temperature 98.7 F (37.1 C), temperature source Oral, resp. rate 16, height _0  (1.575 m), weight 59.875 kg (132 lb), SpO2 99 %. Physical Exam  Vitals reviewed. Constitutional: She is oriented to person, place, and time. She appears well-developed and well-nourished. No distress.  HENT:  Head: Normocephalic and atraumatic.  Mouth/Throat: Oropharynx is clear and moist.  Eyes: Conjunctivae and EOM are normal. Pupils are equal, round, and reactive to light. No scleral icterus.  Neck: Normal range of motion. No JVD present. No tracheal deviation present. No thyromegaly present.  Some pain with neck flexion  Cardiovascular: Normal rate, regular rhythm and normal heart sounds.  Exam reveals no gallop and no friction rub.   No murmur heard. Respiratory: Effort normal and breath sounds  normal.  GI: Soft. Bowel sounds are normal. She exhibits no distension. There is no tenderness.  Genitourinary:  Deferred  Musculoskeletal: Normal range of motion. She exhibits no edema.  Lymphadenopathy:    She has no cervical adenopathy.  Neurological: She is alert and oriented to person, place, and time. No cranial nerve deficit. She exhibits normal muscle tone.  Skin: Skin is warm and dry. No rash noted. No erythema.  Psychiatric: She has a normal mood and affect. Her behavior is normal. Judgment and thought content normal.     Assessment/Plan  This is a 34 year old Caucasian female admitted for intractable headache possibly secondary to viral meningitis. 1. Headache: Ongoing intractable. CSF appears aseptic. The patient has symptoms of meningitis thus we will place her on droplet precautions. Gram stain and viral PCR pending. The patient denies any lesions consistent with HSV. I will place her on acyclovir until PCR is negative. Also discontinue Adderall for now as headache is a known side effect of this medication. 2. Depression: Stable; the patient is not suicidal or homicidal 3. DVT prophylaxis: Heparin 4. GI prophylaxis: None The patient is a full code. Time spent on admission orders and patient care approximately 45 minutes  Harrie Foreman 01/17/2015, 6:46 AM

## 2015-01-17 NOTE — ED Provider Notes (Signed)
-----------------------------------------   6:01 AM on 01/17/2015 -----------------------------------------   Blood pressure 100/69, pulse 75, temperature 98.7 F (37.1 C), temperature source Oral, resp. rate 16, height 5\' 2"  (1.575 m), weight 132 lb (59.875 kg), SpO2 99 %.  Assuming care from Dr. Reita Cliche.  In short, Heather King is a 34 y.o. female with a chief complaint of Headache and Loss of Consciousness .  Refer to the original H&P for additional details.  The current plan of care is to follow up the resolution of the headache.  The patient continued to have headaches so I did give her some Furacin which also did not help her pain. I discussed with the patient that given her intractable pain she may need a lumbar puncture. The patient initially was hesitant but then consented to see even a lumbar puncture for further evaluation.  LUMBAR PUNCTURE  Date/Time: 01/17/2015 at 4:00 AM Performed by: Charlesetta Ivory P  Consent: Verbal consent obtained. Written consent obtained. Risks and benefits: risks, benefits and alternatives were discussed Consent given by: patient Patient understanding: patient states understanding of the procedure being performed  Patient consent: the patient's understanding of the procedure matches consent given  Procedure consent: procedure consent matches procedure scheduled  Relevant documents: relevant documents present and verified  Test results: test results available and properly labeled Site marked: the operative site was marked Imaging studies: imaging studies available  Required items: required blood products, implants, devices, and special equipment available  Patient identity confirmed: verbally with patient and arm band  Time out: Immediately prior to procedure a "time out" was called to verify the correct patient, procedure, equipment, support staff and site/side marked as required.  Indications: Intractable Headache Anesthesia: local  infiltration Local anesthetic: lidocaine 1% without epinephrine Anesthetic total: 2.5 ml Patient sedated: ativan 1mg  Preparation: Patient was prepped and draped in the usual sterile fashion. Lumbar space: L3-L4 interspace Patient's position: left lateral decubitus Needle gauge: 22 Needle length: 3.5 in Number of attempts: 1 Opening pressure: 15 cm H2O Fluid appearance: clear and colorless Tubes of fluid: 4 Total volume: 9 ml Post-procedure: site cleaned and adhesive bandage applied Patient tolerance: Patient tolerated the procedure well with no immediate complications  After the lumbar puncture I gave the patient another 1 L of normal saline IV and ordered some Dilaudid. The patient's blood pressure was on the lower side so the Dilaudid was held. I will admit the patient to the hospitalist service for intractable headache.    Loney Hering, MD 01/17/15 [id number]

## 2015-01-17 NOTE — Progress Notes (Signed)
[facility] NAME: Heather King    MR#:  [id number]  DATE OF BIRTH:  05-Feb-1981  SUBJECTIVE:  CHIEF COMPLAINT:   Chief Complaint  Patient presents with  . Headache  . Loss of Consciousness   Continues to have headache 10 out of 10 at times, also neck pain, also very stiff neck, photophobia, nausea and vomiting  REVIEW OF SYSTEMS:   Review of Systems  Constitutional: Positive for fever, chills and malaise/fatigue.  Eyes: Positive for photophobia.  Gastrointestinal: Positive for nausea, vomiting and diarrhea.  Musculoskeletal: Positive for neck pain.  Neurological: Positive for dizziness, tingling, sensory change, weakness and headaches.    DRUG ALLERGIES:  No Known Allergies  VITALS:  Blood pressure 92/49, pulse 84, temperature 98.2 F (36.8 C), temperature source Oral, resp. rate 20, height 5\' 2"  (1.575 m), weight 62.171 kg (137 lb 1 oz), SpO2 99 %.  PHYSICAL EXAMINATION:  GENERAL:  34 y.o.-year-old patient lying in the bed, uncomfortable EYES: Pupils equal, round, reactive to light and accommodation. No scleral icterus. Extraocular muscles intact.  HEENT: Head atraumatic, normocephalic. Oropharynx and nasopharynx clear.  NECK:  No jugular venous distention. No thyroid enlargement, no tenderness. Neck is stiff, she cannot turn to either side or bring her chin to her chest LUNGS: Normal breath sounds bilaterally, no wheezing, rales,rhonchi or crepitation. No use of accessory muscles of respiration.  CARDIOVASCULAR: S1, S2 normal. No murmurs, rubs, or gallops.  ABDOMEN: Soft, nontender, nondistended. Bowel sounds present. No organomegaly or mass.  EXTREMITIES: No pedal edema, cyanosis, or clubbing.  NEUROLOGIC: Cranial nerves II through XII are intact. Muscle strength 5/5 in all extremities. Sensation intact. Gait not checked. Positive Kernig and Brudzinski signs PSYCHIATRIC: The patient is alert and oriented x 3.   SKIN: No obvious rash, lesion, or ulcer.    LABORATORY PANEL:   CBC  Recent Labs Lab 01/17/15 0913  WBC 7.1  HGB 11.5*  HCT 34.2*  PLT 155   ------------------------------------------------------------------------------------------------------------------  Chemistries   Recent Labs Lab 01/16/15 2003 01/17/15 0913  NA 139  --   K 3.6  --   CL 105  --   CO2 27  --   GLUCOSE 103*  --   BUN 9  --   CREATININE 0.66 0.49  CALCIUM 9.1  --    ------------------------------------------------------------------------------------------------------------------  Cardiac Enzymes No results for input(s): TROPONINI in the last 168 hours. ------------------------------------------------------------------------------------------------------------------  RADIOLOGY:  Ct Head Wo Contrast  01/16/2015  CLINICAL DATA:  Headache, dizziness. EXAM: CT HEAD WITHOUT CONTRAST TECHNIQUE: Contiguous axial images were obtained from the base of the skull through the vertex without intravenous contrast. COMPARISON:  CT scan of November 21, 2009. FINDINGS: Bony calvarium appears intact. No mass effect or midline shift is noted. Ventricular size is within normal limits. There is no evidence of mass lesion, hemorrhage or acute infarction. IMPRESSION: Normal head CT. Electronically Signed   By: Marijo Conception, M.D.   On: 01/16/2015 21:41   Mr Jodene Nam Head Wo Contrast  01/17/2015  CLINICAL DATA:  Severe headache for 1 week located at the top of the head. Passed out last night due to pain. EXAM: MRI HEAD WITHOUT AND WITH CONTRAST MRA HEAD WITHOUT CONTRAST MRV HEAD WITHOUT CONTRAST TECHNIQUE: Multiplanar, multiecho pulse sequences of the brain and surrounding structures were obtained without and with intravenous contrast. Angiographic images of the head were obtained using MRA technique without contrast. Angiographic images of the intracranial venous structures were  obtained using MRV technique without intravenous  contrast. CONTRAST:  49mL MULTIHANCE GADOBENATE DIMEGLUMINE 529 MG/ML IV SOLN COMPARISON:  Head CT 01/16/2015 and MRI 10/12/2010 FINDINGS: MRI HEAD FINDINGS Some sequences are mildly motion degraded. 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 There is no evidence of acute infarct, intracranial hemorrhage, mass, midline shift, or extra-axial fluid collection. Ventricles and sulci are normal. The brain is normal in signal. No abnormal enhancement is identified. The major dural venous sinuses demonstrate normal enhancement without evidence of thrombus. Orbits are unremarkable. A small amount of fluid/secretions is noted in the left sphenoid sinus. Mastoid air cells are clear. Major intracranial vascular flow voids are preserved. MRA HEAD FINDINGS The visualized distal vertebral arteries are patent with the left being slightly larger than the right. PICA, AICA, and SCA origins are patent. Basilar artery is widely patent. There is a small right posterior communicating artery. PCAs are unremarkable. Internal carotid arteries are patent from skullbase to carotid termini without stenosis. Anterior communicating artery is present, with slightly more bulbous appearance on the left favored to to be due to the vessel's orientation rather than reflecting a 1.5 mm aneurysm. ACAs and MCAs are unremarkable. MRV HEAD FINDINGS Superior sagittal sinus, inferior sagittal sinus, internal cerebral veins, vein of Galen, straight sinus, transverse sinuses, sigmoid sinuses, and upper internal jugular veins are patent without evidence of thrombus or stenosis. The right transverse and right sigmoid sinuses are mildly dominant. Filling defect in the lateral aspect of the left transverse sinus reflects an arachnoid granulation. IMPRESSION: 1. Unremarkable appearance of the brain. No acute intracranial abnormality or mass. 2. Negative head MRA. 3. Negative head MRV. Electronically Signed   By: Logan Bores M.D.   On: 01/17/2015 13:45   Mr Jeri Cos [id number] Contrast  01/17/2015  CLINICAL DATA:  Severe headache for 1 week located at the top of the head. Passed out last night due to pain. EXAM: MRI HEAD WITHOUT AND WITH CONTRAST MRA HEAD WITHOUT CONTRAST MRV HEAD WITHOUT CONTRAST TECHNIQUE: Multiplanar, multiecho pulse sequences of the brain and surrounding structures were obtained without and with intravenous contrast. Angiographic images of the head were obtained using MRA technique without contrast. Angiographic images of the intracranial venous structures were obtained using MRV technique without intravenous contrast. CONTRAST:  37mL MULTIHANCE GADOBENATE DIMEGLUMINE 529 MG/ML IV SOLN COMPARISON:  Head CT 01/16/2015 and MRI 10/12/2010 FINDINGS: MRI HEAD FINDINGS Some sequences are mildly motion degraded. 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 There is no evidence of acute infarct, intracranial hemorrhage, mass, midline shift, or extra-axial fluid collection. Ventricles and sulci are normal. The brain is normal in signal. No abnormal enhancement is identified. The major dural venous sinuses demonstrate normal enhancement without evidence of thrombus. Orbits are unremarkable. A small amount of fluid/secretions is noted in the left sphenoid sinus. Mastoid air cells are clear. Major intracranial vascular flow voids are preserved. MRA HEAD FINDINGS The visualized distal vertebral arteries are patent with the left being slightly larger than the right. PICA, AICA, and SCA origins are patent. Basilar artery is widely patent. There is a small right posterior communicating artery. PCAs are unremarkable. Internal carotid arteries are patent from skullbase to carotid termini without stenosis. Anterior communicating artery is present, with slightly more bulbous appearance on the left favored to to be due to the vessel's orientation rather than reflecting a 1.5 mm aneurysm. ACAs and MCAs are unremarkable. MRV HEAD FINDINGS Superior sagittal sinus, inferior sagittal sinus,  internal cerebral veins, vein of Galen, straight  sinus, transverse sinuses, sigmoid sinuses, and upper internal jugular veins are patent without evidence of thrombus or stenosis. The right transverse and right sigmoid sinuses are mildly dominant. Filling defect in the lateral aspect of the left transverse sinus reflects an arachnoid granulation. IMPRESSION: 1. Unremarkable appearance of the brain. No acute intracranial abnormality or mass. 2. Negative head MRA. 3. Negative head MRV. Electronically Signed   By: Logan Bores M.D.   On: 01/17/2015 13:45   Mr Mrv Miguel Dibble Wo Cm  01/17/2015  CLINICAL DATA:  Severe headache for 1 week located at the top of the head. Passed out last night due to pain. EXAM: MRI HEAD WITHOUT AND WITH CONTRAST MRA HEAD WITHOUT CONTRAST MRV HEAD WITHOUT CONTRAST TECHNIQUE: Multiplanar, multiecho pulse sequences of the brain and surrounding structures were obtained without and with intravenous contrast. Angiographic images of the head were obtained using MRA technique without contrast. Angiographic images of the intracranial venous structures were obtained using MRV technique without intravenous contrast. CONTRAST:  96mL MULTIHANCE GADOBENATE DIMEGLUMINE 529 MG/ML IV SOLN COMPARISON:  Head CT 01/16/2015 and MRI 10/12/2010 FINDINGS: MRI HEAD FINDINGS Some sequences are mildly motion degraded. 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 There is no evidence of acute infarct, intracranial hemorrhage, mass, midline shift, or extra-axial fluid collection. Ventricles and sulci are normal. The brain is normal in signal. No abnormal enhancement is identified. The major dural venous sinuses demonstrate normal enhancement without evidence of thrombus. Orbits are unremarkable. A small amount of fluid/secretions is noted in the left sphenoid sinus. Mastoid air cells are clear. Major intracranial vascular flow voids are preserved. MRA HEAD FINDINGS The visualized distal vertebral arteries are patent with  the left being slightly larger than the right. PICA, AICA, and SCA origins are patent. Basilar artery is widely patent. There is a small right posterior communicating artery. PCAs are unremarkable. Internal carotid arteries are patent from skullbase to carotid termini without stenosis. Anterior communicating artery is present, with slightly more bulbous appearance on the left favored to to be due to the vessel's orientation rather than reflecting a 1.5 mm aneurysm. ACAs and MCAs are unremarkable. MRV HEAD FINDINGS Superior sagittal sinus, inferior sagittal sinus, internal cerebral veins, vein of Galen, straight sinus, transverse sinuses, sigmoid sinuses, and upper internal jugular veins are patent without evidence of thrombus or stenosis. The right transverse and right sigmoid sinuses are mildly dominant. Filling defect in the lateral aspect of the left transverse sinus reflects an arachnoid granulation. IMPRESSION: 1. Unremarkable appearance of the brain. No acute intracranial abnormality or mass. 2. Negative head MRA. 3. Negative head MRV. Electronically Signed   By: Logan Bores M.D.   On: 01/17/2015 13:45    EKG:   Orders placed or performed during the hospital encounter of 01/16/15  . ED EKG  . ED EKG  . EKG 12-Lead  . EKG 12-Lead    ASSESSMENT AND PLAN:   1. Headache - History of diarrhea, fevers and chills, headache, nuchal rigidity concerning for meningitis. LP does not support. - MRI is normal, CT normal - Neurology feels headache more likely related to tension or migraine. We'll continue with Fioricet, Toradol, Reglan, magnesium, Benadryl. Avoid narcotics - HSV PCR of CSF is pending, question whether other viral etiologies may be contributing     All the records are reviewed and case discussed with Care Management/Social Workerr. Management plans discussed with the patient, family and they are in agreement.  CODE STATUS: Full   TOTAL TIME TAKING CARE OF  THIS PATIENT: 25 minutes.   Greater than 50% of time spent in care coordination and counseling. POSSIBLE D/C IN 1 DAYS, DEPENDING ON CLINICAL CONDITION.   Myrtis Ser M.D on 01/17/2015 at 5:04 PM  Between 7am to 6pm - Pager - [phone]  After 6pm go to [email or web address] - password [email or web address] [facility]  [facility]  Office  [phone]  CC: Primary care physician; Adin Hector, MD

## 2015-01-17 NOTE — ED Notes (Signed)
Dr. Webster at bedside.  

## 2015-01-17 NOTE — ED Notes (Signed)
Per MD order, hold Dilaudid due to low blood pressure of 100/69. Pt notified of plan of care.

## 2015-01-17 NOTE — ED Notes (Signed)
Pt tolerated lumbar puncture well.

## 2015-01-17 NOTE — ED Notes (Signed)
Dr. Dahlia Client was informed of pain. Dr. Dahlia Client stated two options: lumbar puncture or referral to neurology. Patient was informed of options. Patient is taking some time to think about options. Dr. Dahlia Client was informed that patient wants to know more about lumbar puncture.

## 2015-01-17 NOTE — Consult Note (Signed)
Reason for Consult: menigitis Referring Physician: Dr. Ila Mcgill Heather King is an 34 y.o. female.  HPI: 34 yo RHD F presents to [facility] with persistent headache for the past week.  Pt states that she rarely gets a headache and has never had one like this before.  She reports 10/10 pain in the top of the head that goes back to the neck.  She does report some nausea with this but no vomiting.  She does have mild light sensitivity.  She states that the morphine has helped but nothing else.  She also reports numbness and tingling in both hands.  Past Medical History  Diagnosis Date  . ADD (attention deficit disorder)   . Depression     Past Surgical History  Procedure Laterality Date  . Abdominal hysterectomy    . Cholecystectomy      Family History  Problem Relation Age of Onset  . Diabetes Mellitus II Mother   . Hypertension Mother     Social History:  reports that she has never smoked. She does not have any smokeless tobacco history on file. She reports that she does not drink alcohol or use illicit drugs.  Allergies: No Known Allergies  Medications: personally reviewed by me as per chart  Results for orders placed or performed during the hospital encounter of 01/16/15 (from the past 48 hour(s))  Basic metabolic panel     Status: Abnormal   Collection Time: 01/16/15  8:03 PM  Result Value Ref Range   Sodium 139 135 - 145 mmol/L   Potassium 3.6 3.5 - 5.1 mmol/L   Chloride 105 101 - 111 mmol/L   CO2 27 22 - 32 mmol/L   Glucose, Bld 103 (H) 65 - 99 mg/dL   BUN 9 6 - 20 mg/dL   Creatinine, Ser 0.66 0.44 - 1.00 mg/dL   Calcium 9.1 8.9 - 10.3 mg/dL   GFR calc non Af Amer >60 >60 mL/min   GFR calc Af Amer >60 >60 mL/min    Comment: (NOTE) The eGFR has been calculated using the CKD EPI equation. This calculation has not been validated in all clinical situations. eGFR's persistently <60 mL/min signify possible Chronic Kidney Disease.    Anion gap 7 5 - 15  CBC     Status: None    Collection Time: 01/16/15  8:03 PM  Result Value Ref Range   WBC 6.0 3.6 - 11.0 K/uL   RBC 4.07 3.80 - 5.20 MIL/uL   Hemoglobin 13.2 12.0 - 16.0 g/dL   HCT 38.6 35.0 - 47.0 %   MCV 94.8 80.0 - 100.0 fL   MCH 32.5 26.0 - 34.0 pg   MCHC 34.3 32.0 - 36.0 g/dL   RDW 12.2 11.5 - 14.5 %   Platelets 158 150 - 440 K/uL  CSF cell count with differential collection tube #: 1 and 3     Status: Abnormal   Collection Time: 01/17/15  4:14 AM  Result Value Ref Range   Tube # 3     Comment: NOTIFIED MEGAN JONES AT 0730 BY QSD CORRECTED ON 12/07 AT 0731: PREVIOUSLY REPORTED AS 1 and 3    Color, CSF COLORLESS COLORLESS   Appearance, CSF CLEAR (A) CLEAR   Supernatant CLEAR    RBC Count, CSF 0 0 - 3 /cu mm   WBC, CSF 0 /cu mm   Segmented Neutrophils-CSF 0 %   Lymphs, CSF 0 %   Monocyte-Macrophage-Spinal Fluid 0 %   Eosinophils, CSF 0 %  Other Cells, CSF 0   Glucose, CSF     Status: Abnormal   Collection Time: 01/17/15  4:14 AM  Result Value Ref Range   Glucose, CSF 73 (H) 40 - 70 mg/dL  Protein, CSF     Status: None   Collection Time: 01/17/15  4:14 AM  Result Value Ref Range   Total  Protein, CSF 22 15 - 45 mg/dL  CSF culture with Stat gram stain     Status: None (Preliminary result)   Collection Time: 01/17/15  4:14 AM  Result Value Ref Range   Specimen Description CSF    Special Requests Normal    Gram Stain FEW WBC SEEN NO ORGANISMS SEEN     Culture PENDING    Report Status PENDING   CSF cell count with differential collection tube #: 3     Status: Abnormal   Collection Time: 01/17/15  4:14 AM  Result Value Ref Range   Tube # 1     Comment: NOTIFIED MEGAN JONES AT 0730 BY QSD CORRECTED ON 12/07 AT 0730: PREVIOUSLY REPORTED AS 3    Color, CSF COLORLESS COLORLESS   Appearance, CSF CLEAR (A) CLEAR   RBC Count, CSF 120 (H) 0 - 3 /cu mm   WBC, CSF 8 /cu mm   Segmented Neutrophils-CSF 5 %   Lymphs, CSF 84 %   Monocyte-Macrophage-Spinal Fluid 11 %   Eosinophils, CSF 0 %    Other Cells, CSF 0   CBC     Status: Abnormal   Collection Time: 01/17/15  9:13 AM  Result Value Ref Range   WBC 7.1 3.6 - 11.0 K/uL   RBC 3.59 (L) 3.80 - 5.20 MIL/uL   Hemoglobin 11.5 (L) 12.0 - 16.0 g/dL   HCT 34.2 (L) 35.0 - 47.0 %   MCV 95.1 80.0 - 100.0 fL   MCH 32.1 26.0 - 34.0 pg   MCHC 33.8 32.0 - 36.0 g/dL   RDW 12.4 11.5 - 14.5 %   Platelets 155 150 - 440 K/uL    Ct Head Wo Contrast  01/16/2015  CLINICAL DATA:  Headache, dizziness. EXAM: CT HEAD WITHOUT CONTRAST TECHNIQUE: Contiguous axial images were obtained from the base of the skull through the vertex without intravenous contrast. COMPARISON:  CT scan of November 21, 2009. FINDINGS: Bony calvarium appears intact. No mass effect or midline shift is noted. Ventricular size is within normal limits. There is no evidence of mass lesion, hemorrhage or acute infarction. IMPRESSION: Normal head CT. Electronically Signed   By: Marijo Conception, M.D.   On: 01/16/2015 21:41    Review of Systems  Constitutional: Negative.   HENT: Negative for congestion, ear discharge, ear pain, hearing loss, nosebleeds, sore throat and tinnitus.   Eyes: Positive for photophobia. Negative for blurred vision, double vision, pain, discharge and redness.  Respiratory: Negative.  Negative for stridor.   Cardiovascular: Negative.   Gastrointestinal: Positive for nausea. Negative for heartburn, vomiting, abdominal pain, diarrhea, constipation, blood in stool and melena.  Genitourinary: Negative.   Musculoskeletal: Negative.   Skin: Negative.   Neurological: Positive for sensory change and headaches. Negative for dizziness, tingling, tremors, speech change, focal weakness, seizures and loss of consciousness.  Psychiatric/Behavioral: Positive for depression.   Blood pressure 101/69, pulse 79, temperature 97.6 F (36.4 C), temperature source Oral, resp. rate 20, height '5\' 2"'  (1.575 m), weight 62.171 kg (137 lb 1 oz), SpO2 100 %. Physical Exam  Nursing note  and vitals reviewed. Constitutional: She is  oriented to person, place, and time. She appears well-developed and well-nourished. No distress.  HENT:  Head: Normocephalic and atraumatic.  Right Ear: External ear normal.  Left Ear: External ear normal.  Nose: Nose normal.  Mouth/Throat: Oropharynx is clear and moist.  Eyes: Conjunctivae and EOM are normal. Pupils are equal, round, and reactive to light. No scleral icterus.  Neck: Normal range of motion. Muscular tenderness present. Rigidity present. Brudzinski's sign noted. No Kernig's sign noted.  Cardiovascular: Normal rate, regular rhythm, normal heart sounds and intact distal pulses.   No murmur heard. Respiratory: Effort normal and breath sounds normal.  GI: Soft. Bowel sounds are normal. She exhibits no distension.  Musculoskeletal: Normal range of motion. She exhibits no edema.  Neurological: She is alert and oriented to person, place, and time. She has normal reflexes. She displays normal reflexes. No cranial nerve deficit. She exhibits normal muscle tone. Coordination normal.  Poor effort in all exam  Skin: Skin is warm. She is not diaphoretic.  Psychiatric: Her mood appears anxious.    Assessment/Plan: 1.  Headache-  This sounds more like a tension headache in someone who is very anxious but pt denies stress.  Migraine is also on diffential.  Doubt secondary causes but will r/o those things such as venous thrombosis, mass or aneurysm.  Highly doubt menigitis -  MRI/A/V of head -  toradol 54m, reglan 535m magnesium 50050mnd benadryl 12.5mg8mh IV -  depacon 500mg69mx 1 -  PRN fioricet -  Avoid all narcotics -  Will follow briefly  Orrie Lascano 01/17/2015, 9:58 AM

## 2015-01-17 NOTE — ED Notes (Signed)
Pt is tearful. Pt was provided tissues and the lights were turned down to help with headache. Significant other is at bedside.

## 2015-01-17 NOTE — Progress Notes (Signed)
Initial Nutrition Assessment   INTERVENTION:   Meals and Snacks: Cater to patient preferences Medical Food Supplement Therapy: will send [employer] Instant Breakfast at lunch for pt to try   NUTRITION DIAGNOSIS:   Inadequate oral intake related to poor appetite as evidenced by per patient/family report.  GOAL:   Patient will meet greater than or equal to 90% of their needs  MONITOR:    (Energy Intake, anthropometrics, Digestive system,)  REASON FOR ASSESSMENT:   Malnutrition Screening Tool    ASSESSMENT:   Pt admitted with headache, possible meningitis; Neuro consulted  Past Medical History  Diagnosis Date  . ADD (attention deficit disorder)   . Depression     Diet Order:  Diet regular Room service appropriate?: Yes; Fluid consistency:: Thin    Current Nutrition: Pt reports eating 100% of pancakes this am and tolerated well.  Food/Nutrition-Related History: Pt reports poor appetite only recently secondary to diarrhea and headache. Pt reports usually trying to eat well and eat balanced PTA, 3 meals per day.   Scheduled Medications:  . diphenhydrAMINE  12.5 mg Intravenous Q6H  . docusate sodium  100 mg Oral BID  . heparin  5,000 Units Subcutaneous 3 times per day  .  HYDROmorphone (DILAUDID) injection  0.5 mg Intravenous Once  . ketorolac  30 mg Intravenous 4 times per day  . magnesium sulfate 1 - 4 g bolus IVPB  0.5 g Intravenous Q6H  . metoCLOPramide (REGLAN) injection  5 mg Intravenous 4 times per day  . pantoprazole  40 mg Oral BID  . valproate sodium  500 mg Intravenous Once  . venlafaxine XR  150 mg Oral Daily  . venlafaxine XR  75 mg Oral QHS  . zolpidem  5 mg Oral QHS    Continuous Medications:  . sodium chloride 125 mL/hr at 01/17/15 0816     Electrolyte/Renal Profile and Glucose Profile:   Recent Labs Lab 01/16/15 2003  NA 139  K 3.6  CL 105  CO2 27  BUN 9  CREATININE 0.66  CALCIUM 9.1  GLUCOSE 103*   Protein Profile: No results for  input(s): ALBUMIN in the last 168 hours.  Gastrointestinal Profile: Last BM:  01/16/2015   Weight Change: Pt reports losing 26lbs in 1.5 months however also feels she has gained most it back. Pt reports she was weighing in the 120lbs. Current measured weight of 137lbs. RD weighed pt on visit at 140.0lbs   Skin:  Reviewed, no issues   Height:   Ht Readings from Last 1 Encounters:  01/17/15 5\' 2"  (1.575 m)    Weight:   Wt Readings from Last 1 Encounters:  01/17/15 137 lb 1 oz (62.171 kg)    BMI:  Body mass index is 25.06 kg/(m^2).   EDUCATION NEEDS:   No education needs identified at this time   [place], [place], LDN Pager [phone]

## 2015-01-17 NOTE — Plan of Care (Signed)
Pt admitted for intractable headache pain for 5 days.  Passed out yesterday.  R/o viral meningitis.  MRI/CT normal.  Treating for tension/migraine headache w/ Mg, Toradol, Reglan and Benadryl.  PRN fioricet.  Pt on enteric precautions b/c she's had diarrhea since Friday - but no stools since yesterday. Pain is radiating to neck, back and arms and having tingling in finger tips.  Neurology consult - sd to avoid narcotics.

## 2015-01-17 NOTE — ED Notes (Signed)
Upon this RN entry to treatment room pt appears to be sleeping, pt resting with eyes closed, respirations even and unlabored.

## 2015-01-17 NOTE — ED Notes (Signed)
MD notified pt's BP 88/67. Received verbal orders for 1L bolus NS.

## 2015-01-18 LAB — BASIC METABOLIC PANEL
Anion gap: 3 — ABNORMAL LOW (ref 5–15)
BUN: 9 mg/dL (ref 6–20)
CHLORIDE: 112 mmol/L — AB (ref 101–111)
CO2: 24 mmol/L (ref 22–32)
Calcium: 7.6 mg/dL — ABNORMAL LOW (ref 8.9–10.3)
Creatinine, Ser: 0.5 mg/dL (ref 0.44–1.00)
GFR calc Af Amer: >60 mL/min (ref >60)
GLUCOSE: 87 mg/dL (ref 65–99)
POTASSIUM: 3.8 mmol/L (ref 3.5–5.1)
Sodium: 139 mmol/L (ref 135–145)

## 2015-01-18 LAB — CBC
HCT: 31.4 % — ABNORMAL LOW (ref 35.0–47.0)
Hemoglobin: 10.5 g/dL — ABNORMAL LOW (ref 12.0–16.0)
MCH: 31.9 pg (ref 26.0–34.0)
MCHC: 33.4 g/dL (ref 32.0–36.0)
MCV: 95.6 fL (ref 80.0–100.0)
PLATELETS: 146 10*3/uL — AB (ref 150–440)
RBC: 3.29 MIL/uL — AB (ref 3.80–5.20)
RDW: 12.6 % (ref 11.5–14.5)
WBC: 5.4 10*3/uL (ref 3.6–11.0)

## 2015-01-18 MED ORDER — KETOROLAC TROMETHAMINE 30 MG/ML IJ SOLN: 30.0000 mg | Freq: Once | INTRAMUSCULAR | Status: DC

## 2015-01-18 MED ORDER — BUTALBITAL-APAP-CAFFEINE 50-325-40 MG PO TABS: 2.0000 | ORAL_TABLET | Freq: Four times a day (QID) | ORAL | Status: DC | PRN

## 2015-01-18 MED ORDER — ORPHENADRINE CITRATE 30 MG/ML IJ SOLN
60.0000 mg | Freq: Two times a day (BID) | INTRAMUSCULAR | Status: DC
  Administered 2015-01-18: 60 mg via INTRAMUSCULAR
  Filled 2015-01-18 (×3): qty 2

## 2015-01-18 MED ORDER — KETOROLAC TROMETHAMINE 30 MG/ML IJ SOLN
30.0000 mg | INTRAMUSCULAR | Status: AC
  Administered 2015-01-18: 14:00:00 30 mg via INTRAMUSCULAR

## 2015-01-18 MED ORDER — ORPHENADRINE CITRATE ER 100 MG PO TB12: 100.0000 mg | ORAL_TABLET | Freq: Two times a day (BID) | ORAL | Status: DC | PRN

## 2015-01-18 NOTE — Progress Notes (Signed)
Requested by nurse to speak with patient. Patient in room crying, states her head hurts. Stated she was upset about the way Dr. Verdell Carmine had spoken with her. Stated "he was smirky, made me feel like it was all in my head and there was nothing wrong with me, and it really hurt my feelings, he was dismissing everything I said. It doesn't have anything to do with any of the nurses, they were all wonderful. Marinell Blight and my night shift nurses were all great. They are angels, he made me feel like I was a problem to deal with". Leana Roe was working on getting medication for patient's complaints of pain.

## 2015-01-18 NOTE — Progress Notes (Signed)
NEUROLOGY NOTE  S: Pt complains of 10/10 headache.  Did sleep last night  ROS neg x 8 systems except as above  O: 97.6    110/72    85   18 Nl weight, no distress Normocephalic, oropharynx clear Supple, no LAD CTA B, no wheezing RRR, no murmur No C/C/E  A+Ox3, nl speech and language PERRLA, EOMI, face symmetric 5/5 B  MRI normal  A/P: 1. Headache-  Pt appears not to be in pain but is complaining of 10/10.  This is still likely a tension headache with some migrainous features and will not completely go away in hospital -  Add norflex 60mg  IM q12h -  May benefit from steroids -  PRN Fioricet -  Would still avoid narcotics -  Will follow if she is still here

## 2015-01-18 NOTE — Progress Notes (Signed)
Pt given prescriptions x 2, pt given d/c instructions r/t activity, followup care and medications  Voiced understanding.

## 2015-01-18 NOTE — Progress Notes (Signed)
Paged Dr. Verdell Carmine for medication orders for headache

## 2015-01-18 NOTE — Progress Notes (Signed)
Heather King was admitted to the Hospital on 01/16/2015 and Discharged  01/18/2015 and should be excused from work/school   for 4 days starting 01/16/2015 , may return to work/school without any restrictions.  Call Abel Presto MD, [facility]  [phone] with questions.  Henreitta Leber M.D on 01/18/2015,at 4:14 PM

## 2015-01-18 NOTE — Plan of Care (Signed)
Problem: Safety: Goal: Ability to remain free from injury will improve Outcome: Progressing Pt moderate fall risk. Encourage to call for assist to prevent falls.   Problem: Health Behavior/Discharge Planning: Goal: Ability to manage health-related needs will improve Outcome: Progressing Pt able to verbalize wants and needs.   Problem: Pain Managment: Goal: General experience of comfort will improve Outcome: Progressing Pt with headache 8/10. No reduction in pain score. Pt goes to sleep after medication administration.   Problem: Physical Regulation: Goal: Will remain free from infection Outcome: Progressing Hand hygiene encourage to prevent infection. Pt afebrile this shift.   Problem: Fluid Volume: Goal: Ability to maintain a balanced intake and output will improve Outcome: Progressing PO intake encouraged. IV fluids infusing at 125 ml/hr.   Problem: Bowel/Gastric: Goal: Will not experience complications related to bowel motility Outcome: Progressing No diarrhea this shift. Pt continues on enteric precautions to r/o cdiff.

## 2015-01-18 NOTE — Progress Notes (Signed)
Pt d/c home via wheelchair escorted by staff and family  

## 2015-01-19 LAB — HERPES SIMPLEX VIRUS(HSV) DNA BY PCR
HSV 1 DNA: NEGATIVE
HSV 2 DNA: NEGATIVE

## 2015-01-19 LAB — ENTEROVIRUS PCR: Enterovirus PCR: NEGATIVE

## 2015-01-19 NOTE — Discharge Summary (Signed)
[facility] [place] NAME: Heather King    MR#:  [id number]  DATE OF BIRTH:  10/09/1980  DATE OF ADMISSION:  01/16/2015 ADMITTING PHYSICIAN: Harrie Foreman, MD  DATE OF DISCHARGE: 01/18/2015  5:15 PM  PRIMARY CARE PHYSICIAN: Tama High III, MD    ADMISSION DIAGNOSIS:  Headache, unspecified headache type [R51]  DISCHARGE DIAGNOSIS:  Active Problems:   Viral meningitis   SECONDARY DIAGNOSIS:   Past Medical History  Diagnosis Date  . ADD (attention deficit disorder)   . Depression     HOSPITAL COURSE:   34 year old female with past medical history of ADHD, depression, GERD who presented to the hospital with a headache and felt to have meningitis.  #1 headache-this was due to an atypical migraine/tension headache. Patient underwent a lumbar puncture in the CSF analysis was essentially benign. There was no evidence of viral/bacterial meningitis. -Patient was treated supportively with IV fluids, antiemetics, Fioricet, muscle relaxants, pain medications with Toradol and narcotics were avoided. -After aggressive therapy patient's clinical symptoms have somewhat improved. She denies any further nausea vomiting and her headache has improved and therefore she is being discharged home. -She was discharged on Fioricet as needed, Norflex as a muscle relaxant and also ibuprofen/Tylenol as needed for the pain.  #2 history of ADHD-continue Adderall.  #3 depression continue Effexor.  #4 GERD- continue omeprazole  DISCHARGE CONDITIONS:   Stable  CONSULTS OBTAINED:     DRUG ALLERGIES:  No Known Allergies  DISCHARGE MEDICATIONS:   Discharge Medication List as of 01/18/2015 12:18 PM    START taking these medications   Details  butalbital-acetaminophen-caffeine (FIORICET, ESGIC) 50-325-40 MG tablet Take 2 tablets by mouth every 6 (six) hours as needed for headache., Starting 01/18/2015, Until Discontinued, Print    orphenadrine  (NORFLEX) 100 MG tablet Take 1 tablet (100 mg total) by mouth 2 (two) times daily as needed for muscle spasms., Starting 01/18/2015, Until Discontinued, Print      CONTINUE these medications which have NOT CHANGED   Details  amphetamine-dextroamphetamine (ADDERALL) 20 MG tablet Take 20 mg by mouth 2 (two) times daily., Until Discontinued, Historical Med    omeprazole (PRILOSEC) 20 MG capsule Take 20 mg by mouth 2 (two) times daily., Until Discontinued, Historical Med    !! venlafaxine XR (EFFEXOR-XR) 150 MG 24 hr capsule Take 150 mg by mouth daily., Until Discontinued, Historical Med    !! venlafaxine XR (EFFEXOR-XR) 75 MG 24 hr capsule Take 75 mg by mouth daily., Until Discontinued, Historical Med    zolpidem (AMBIEN) 5 MG tablet Take 5 mg by mouth at bedtime., Until Discontinued, Historical Med     !! - Potential duplicate medications found. Please discuss with provider.       DISCHARGE INSTRUCTIONS:   DIET:  Regular diet  DISCHARGE CONDITION:  Stable  ACTIVITY:  Activity as tolerated  OXYGEN:  Home Oxygen: No.   Oxygen Delivery: room air  DISCHARGE LOCATION:  home   If you experience worsening of your admission symptoms, develop shortness of breath, life threatening emergency, suicidal or homicidal thoughts you must seek medical attention immediately by calling 911 or calling your MD immediately  if symptoms less severe.  You Must read complete instructions/literature along with all the possible adverse reactions/side effects for all the Medicines you take and that have been prescribed to you. Take any new Medicines after you have completely understood and accpet all the possible adverse reactions/side effects.   Please  note  You were cared for by a hospitalist during your hospital stay. If you have any questions about your discharge medications or the care you received while you were in the hospital after you are discharged, you can call the unit and asked to speak  with the hospitalist on call if the hospitalist that took care of you is not available. Once you are discharged, your primary care physician will handle any further medical issues. Please note that NO REFILLS for any discharge medications will be authorized once you are discharged, as it is imperative that you return to your primary care physician (or establish a relationship with a primary care physician if you do not have one) for your aftercare needs so that they can reassess your need for medications and monitor your lab values.     Today   Headache has improved, no nausea, vomiting. Tolerating her breakfast well.  VITAL SIGNS:  Blood pressure 110/72, pulse 85, temperature 97.6 F (36.4 C), temperature source Oral, resp. rate 18, height 5\' 2"  (1.575 m), weight 63.685 kg (140 lb 6.4 oz), SpO2 100 %.  I/O:  No intake or output data in the 24 hours ending 01/19/15 1604  PHYSICAL EXAMINATION:  GENERAL:  34 y.o.-year-old patient lying in the bed with no acute distress.  EYES: Pupils equal, round, reactive to light and accommodation. No scleral icterus. Extraocular muscles intact.  HEENT: Head atraumatic, normocephalic. Oropharynx and nasopharynx clear.  NECK:  Supple, no jugular venous distention. No thyroid enlargement, no tenderness.  LUNGS: Normal breath sounds bilaterally, no wheezing, rales,rhonchi. No use of accessory muscles of respiration.  CARDIOVASCULAR: S1, S2 normal. No murmurs, rubs, or gallops.  ABDOMEN: Soft, non-tender, non-distended. Bowel sounds present. No organomegaly or mass.  EXTREMITIES: No pedal edema, cyanosis, or clubbing.  NEUROLOGIC: Cranial nerves II through XII are intact. No focal motor or sensory defecits b/l.  PSYCHIATRIC: The patient is alert and oriented x 3. Good affect.  SKIN: No obvious rash, lesion, or ulcer.   DATA REVIEW:   CBC  Recent Labs Lab 01/18/15 0738  WBC 5.4  HGB 10.5*  HCT 31.4*  PLT 146*    Chemistries   Recent Labs Lab  01/18/15 0738  NA 139  K 3.8  CL 112*  CO2 24  GLUCOSE 87  BUN 9  CREATININE 0.50  CALCIUM 7.6*    Cardiac Enzymes No results for input(s): TROPONINI in the last 168 hours.  Microbiology Results  Results for orders placed or performed during the hospital encounter of 01/16/15  CSF culture with Stat gram stain     Status: None (Preliminary result)   Collection Time: 01/17/15  4:14 AM  Result Value Ref Range Status   Specimen Description CSF  Final   Special Requests Normal  Final   Gram Stain FEW WBC SEEN NO ORGANISMS SEEN   Final   Culture NO GROWTH 2 DAYS  Final   Report Status PENDING  Incomplete    RADIOLOGY:  No results found.    Management plans discussed with the patient, family and they are in agreement.  CODE STATUS:   TOTAL TIME TAKING CARE OF THIS PATIENT: 40 minutes.    Henreitta Leber M.D on 01/19/2015 at 4:04 PM  Between 7am to 6pm - Pager - [phone]  After 6pm go to [email or web address] - password [email or web address]  [facility]  Office  [phone]  CC: Primary care physician; Adin Hector, MD

## 2015-01-21 LAB — CSF CULTURE W GRAM STAIN
Culture: NO GROWTH
Special Requests: NORMAL

## 2015-01-22 DIAGNOSIS — R519 Headache, unspecified: Secondary | ICD-10-CM | POA: Insufficient documentation

## 2015-01-22 DIAGNOSIS — R51 Headache: Secondary | ICD-10-CM

## 2015-01-29 ENCOUNTER — Other Ambulatory Visit: Payer: Self-pay | Admitting: Neurology

## 2015-01-29 DIAGNOSIS — M542 Cervicalgia: Secondary | ICD-10-CM

## 2015-02-01 ENCOUNTER — Ambulatory Visit
Admission: RE | Admit: 2015-02-01 | Discharge: 2015-02-01 | Disposition: A | Discharge status: Home / Self Care | Payer: Medicaid Other | Source: Ambulatory Visit | Attending: Neurology | Admitting: Neurology

## 2015-02-01 DIAGNOSIS — M50223 Other cervical disc displacement at C6-C7 level: Secondary | ICD-10-CM | POA: Diagnosis not present

## 2015-02-01 DIAGNOSIS — M542 Cervicalgia: Secondary | ICD-10-CM | POA: Diagnosis present

## 2015-02-01 DIAGNOSIS — M50222 Other cervical disc displacement at C5-C6 level: Secondary | ICD-10-CM | POA: Diagnosis not present

## 2015-02-01 DIAGNOSIS — M545 Low back pain: Secondary | ICD-10-CM | POA: Insufficient documentation

## 2015-02-01 DIAGNOSIS — M79605 Pain in left leg: Secondary | ICD-10-CM | POA: Insufficient documentation

## 2015-02-01 DIAGNOSIS — M79604 Pain in right leg: Secondary | ICD-10-CM | POA: Diagnosis not present

## 2015-02-01 DIAGNOSIS — G030 Nonpyogenic meningitis: Secondary | ICD-10-CM | POA: Diagnosis present

## 2015-03-20 DIAGNOSIS — R569 Unspecified convulsions: Secondary | ICD-10-CM | POA: Insufficient documentation

## 2015-03-28 DIAGNOSIS — R413 Other amnesia: Secondary | ICD-10-CM | POA: Insufficient documentation

## 2015-05-11 DIAGNOSIS — R41840 Attention and concentration deficit: Secondary | ICD-10-CM | POA: Insufficient documentation

## 2015-05-11 DIAGNOSIS — G4701 Insomnia due to medical condition: Secondary | ICD-10-CM | POA: Insufficient documentation

## 2015-05-11 DIAGNOSIS — G3184 Mild cognitive impairment, so stated: Secondary | ICD-10-CM | POA: Insufficient documentation

## 2015-05-28 ENCOUNTER — Ambulatory Visit
Admission: EM | Admit: 2015-05-28 | Discharge: 2015-05-28 | Disposition: A | Discharge status: Home / Self Care | Payer: Medicaid Other | Attending: Family Medicine | Admitting: Family Medicine

## 2015-05-28 ENCOUNTER — Encounter: Payer: Self-pay | Admitting: Emergency Medicine

## 2015-05-28 DIAGNOSIS — J40 Bronchitis, not specified as acute or chronic: Secondary | ICD-10-CM | POA: Diagnosis not present

## 2015-05-28 MED ORDER — BENZONATATE 100 MG PO CAPS: 100.0000 mg | ORAL_CAPSULE | Freq: Three times a day (TID) | ORAL | Status: DC | PRN

## 2015-05-28 MED ORDER — PREDNISONE 20 MG PO TABS
40.0000 mg | ORAL_TABLET | Freq: Every day | ORAL | Status: DC
Start: 2015-05-28 — End: 2015-07-19

## 2015-05-28 MED ORDER — HYDROCOD POLST-CPM POLST ER 10-8 MG/5ML PO SUER: 5.0000 mL | Freq: Every evening | ORAL | Status: DC | PRN

## 2015-05-28 MED ORDER — ALBUTEROL SULFATE HFA 108 (90 BASE) MCG/ACT IN AERS: 2.0000 | INHALATION_SPRAY | RESPIRATORY_TRACT | Status: DC | PRN

## 2015-05-28 MED ORDER — AZITHROMYCIN 250 MG PO TABS: ORAL_TABLET | ORAL | Status: DC

## 2015-05-28 NOTE — Discharge Instructions (Signed)
Take medication as prescribed. Rest. Drink plenty of fluids.  ° °Follow up with your primary care physician this week. Return to Urgent care or ER for new or worsening concerns.  ° °

## 2015-05-28 NOTE — ED Provider Notes (Signed)
[facility]  ____________________________________________  Time seen: Approximately 2:00 PM  I have reviewed the triage vital signs and the nursing notes.   HISTORY  Chief Complaint Cough   HPI Heather King is a 35 y.o. female presents with complaints of 4 days of cough, congestion and chest congestion. Patient reports nasal congestion has resolved. Patient reports continues with chest congestion and cough. Patient states the cough is primarily a dry hacking cough. Patient states that the cough is throughout the day and night. Patient reports she had some leftover Tessalon Perles from a previous sickness and states that the cough did not go away with the [employer]. Patient reports that today she has intermittently heard herself wheeze. Patient states that the wheezing has only been present with cough and occasional. Denies any fevers. Denies any pain at this time. Patient states that she was not able to sleep well last night due to the cough waking her up intermittently.   Reports continues to eat and drink well. Denies known sick contacts. Denies history of seasonal allergies. Denies recent sickness. Denies recent sedentary lifestyle or immobilization. Denies recent surgeries. Denies chest pain, shortness of breath, chest pain with deep breath, dizziness, weakness, extremity pain, extremity swelling, weakness or dizziness.  No LMP recorded. Patient has had a hysterectomy.   PCP: Caryl Comes    Past Medical History  Diagnosis Date  . ADD (attention deficit disorder)   . Depression     Patient Active Problem List   Diagnosis Date Noted  . Viral meningitis 01/17/2015    Past Surgical History  Procedure Laterality Date  . Abdominal hysterectomy    . Cholecystectomy      Current Outpatient Rx  Name  Route  Sig  Dispense  Refill  . amphetamine-dextroamphetamine (ADDERALL) 20 MG tablet   Oral   Take 20 mg by mouth 2 (two) times daily.      0   .  butalbital-acetaminophen-caffeine (FIORICET, ESGIC) 50-325-40 MG tablet   Oral   Take 2 tablets by mouth every 6 (six) hours as needed for headache.   25 tablet   0   . omeprazole (PRILOSEC) 20 MG capsule   Oral   Take 20 mg by mouth 2 (two) times daily.         . orphenadrine (NORFLEX) 100 MG tablet   Oral   Take 1 tablet (100 mg total) by mouth 2 (two) times daily as needed for muscle spasms.   20 tablet   0   . venlafaxine XR (EFFEXOR-XR) 150 MG 24 hr capsule   Oral   Take 150 mg by mouth daily.         Marland Kitchen venlafaxine XR (EFFEXOR-XR) 75 MG 24 hr capsule   Oral   Take 75 mg by mouth daily.         Marland Kitchen zolpidem (AMBIEN) 5 MG tablet   Oral   Take 5 mg by mouth at bedtime.           Allergies Gabapentin  Family History  Problem Relation Age of Onset  . Diabetes Mellitus II Mother   . Hypertension Mother     Social History Social History  Substance Use Topics  . Smoking status: Never Smoker   . Smokeless tobacco: None  . Alcohol Use: No    Review of Systems Constitutional: No fever/chills Eyes: No visual changes. ENT: No sore throat.Positive cough and chest congestion. Cardiovascular: Denies chest pain. Respiratory: Denies shortness of breath. Gastrointestinal: No abdominal pain.  No nausea, no vomiting.  No diarrhea.  No constipation. Genitourinary: Negative for dysuria. Musculoskeletal: Negative for back pain. Skin: Negative for rash. Neurological: Negative for headaches, focal weakness or numbness.  10-point ROS otherwise negative.  ____________________________________________   PHYSICAL EXAM:  VITAL SIGNS: ED Triage Vitals  Enc Vitals Group     BP 05/28/15 1312 108/75 mmHg     Pulse Rate 05/28/15 1312 93     Resp 05/28/15 1312 16     Temp 05/28/15 1312 98.2 F (36.8 C)     Temp Source 05/28/15 1312 Oral     SpO2 05/28/15 1312 100 %     Weight 05/28/15 1312 132 lb (59.875 kg)     Height 05/28/15 1312 5\' 2"  (1.575 m)     Head Cir --       Peak Flow --      Pain Score 05/28/15 1315 4     Pain Loc --      Pain Edu? --      Excl. in [place]? --    Constitutional: Alert and oriented. Well appearing and in no acute distress. Eyes: Conjunctivae are normal. PERRL. EOMI. Head: Atraumatic. No sinus tenderness to palpation. No swelling. No erythema.  Ears: no erythema, normal TMs bilaterally.   Nose: mild clear rhinorrhea.  Mouth/Throat: Mucous membranes are moist. No pharyngeal erythema. No tonsillar swelling or exudate.  Neck: No stridor.  No cervical spine tenderness to palpation. Hematological/Lymphatic/Immunilogical: No cervical lymphadenopathy. Cardiovascular: Normal rate, regular rhythm. Grossly normal heart sounds.  Good peripheral circulation. Respiratory: Normal respiratory effort.  No retractions. Dry intermittent cough noted in room. Very mild scattered inspiratory wheezes. No rales or rhonchi. Good air movement. Speaks in complete sentences.  Gastrointestinal: Soft and nontender. Normal Bowel sounds. No CVA tenderness. Musculoskeletal: No lower or upper extremity tenderness nor edema. No cervical, thoracic or lumbar tenderness to palpation. Bilateral pedal pulses equal. No calf tenderness bilaterally. Neurologic:  Normal speech and language. No gross focal neurologic deficits are appreciated. No gait instability. Skin:  Skin is warm, dry and intact. No rash noted. Psychiatric: Mood and affect are normal. Speech and behavior are normal.  Wells score: 0; PERC negative.  ____________________________________________   LABS (all labs ordered are listed, but only abnormal results are displayed)  Labs Reviewed - No data to display ____________________________________________  INITIAL IMPRESSION / ASSESSMENT AND PLAN / ED COURSE  Pertinent labs & imaging results that were available during my care of the patient were reviewed by me and considered in my medical decision making (see chart for details).  Very well-appearing  patient. No acute distress. Presents for the complaints of 4 days of cough and congestion. Denies chest pain or shortness of breath. Patient reports intermittent wheezing at home with her cough. Very mild scattered inspiratory wheezes noted. Suspect bronchitis. Will treat patient with oral azithromycin, when necessary albuterol inhaler, prednisone, when necessary Tessalon Perles during the day and when necessary Tussionex at night.Discussed indication, risks and benefits of medications with patient. Encouraged PCP follow up.   Discussed follow up with Primary care physician this week. Discussed follow up and return parameters including no resolution or any worsening concerns. Patient verbalized understanding and agreed to plan.   ____________________________________________   FINAL CLINICAL IMPRESSION(S) / ED DIAGNOSES  Final diagnoses:  Bronchitis      Note: This dictation was prepared with Dragon dictation along with smaller phrase technology. Any transcriptional errors that result from this process are unintentional.    Marylene Land, NP 05/28/15  1648 

## 2015-05-28 NOTE — ED Notes (Signed)
Patient c/o cough and chest congestion since Friday.  Patient denies fevers.

## 2015-07-19 ENCOUNTER — Ambulatory Visit (INDEPENDENT_AMBULATORY_CARE_PROVIDER_SITE_OTHER): Payer: Medicaid Other | Admitting: Family Medicine

## 2015-07-19 ENCOUNTER — Encounter: Payer: Self-pay | Admitting: Family Medicine

## 2015-07-19 VITALS — BP 116/76 | HR 93 | Temp 99.0°F | Ht 62.5 in | Wt 138.0 lb

## 2015-07-19 DIAGNOSIS — F411 Generalized anxiety disorder: Secondary | ICD-10-CM | POA: Diagnosis not present

## 2015-07-19 DIAGNOSIS — R51 Headache: Secondary | ICD-10-CM

## 2015-07-19 DIAGNOSIS — G8929 Other chronic pain: Secondary | ICD-10-CM | POA: Diagnosis not present

## 2015-07-19 DIAGNOSIS — F988 Other specified behavioral and emotional disorders with onset usually occurring in childhood and adolescence: Secondary | ICD-10-CM | POA: Insufficient documentation

## 2015-07-19 DIAGNOSIS — K219 Gastro-esophageal reflux disease without esophagitis: Secondary | ICD-10-CM | POA: Diagnosis not present

## 2015-07-19 DIAGNOSIS — F909 Attention-deficit hyperactivity disorder, unspecified type: Secondary | ICD-10-CM

## 2015-07-19 DIAGNOSIS — F419 Anxiety disorder, unspecified: Secondary | ICD-10-CM | POA: Insufficient documentation

## 2015-07-19 DIAGNOSIS — G10 Huntington's disease: Secondary | ICD-10-CM

## 2015-07-19 DIAGNOSIS — M545 Low back pain, unspecified: Secondary | ICD-10-CM | POA: Insufficient documentation

## 2015-07-19 MED ORDER — ARIPIPRAZOLE 10 MG PO TBDP: 10.0000 mg | ORAL_TABLET | Freq: Every day | ORAL | Status: DC

## 2015-07-19 NOTE — Assessment & Plan Note (Signed)
Under good control on her omeprazole. Continue current regimen. Continue to monitor.

## 2015-07-19 NOTE — Assessment & Plan Note (Signed)
Takes 6 norco a month. Uses very appropriately. Will obtain records from previous provider. Continue to monitor.

## 2015-07-19 NOTE — Assessment & Plan Note (Signed)
Did not do well on any medication. Likely due to hx of viral menigitis. Continue to monitor. Call with concerns.

## 2015-07-19 NOTE — Patient Instructions (Signed)
Aripiprazole tablets What is this medicine? ARIPIPRAZOLE (ay ri PIP ray zole) is an atypical antipsychotic. It is used to treat schizophrenia and bipolar disorder, also known as manic-depression. It is also used to treat Tourette's disorder and some symptoms of autism. This medicine may also be used in combination with antidepressants to treat major depressive disorder. This medicine may be used for other purposes; ask your health care provider or pharmacist if you have questions. What should I tell my health care provider before I take this medicine? They need to know if you have any of these conditions: -dehydration -dementia -diabetes -heart disease -history of stroke -low blood counts, like low white cell, platelet, or red cell counts -Parkinson's disease -seizures -suicidal thoughts, plans, or attempt; a previous suicide attempt by you or a family member -an unusual or allergic reaction to aripiprazole, other medicines, foods, dyes, or preservatives -pregnant or trying to get pregnant -breast-feeding How should I use this medicine? Take this medicine by mouth with a glass of water. Follow the directions on the prescription label. You can take this medicine with or without food. Take your doses at regular intervals. Do not take your medicine more often than directed. Do not stop taking except on the advice of your doctor or health care professional. A special MedGuide will be given to you by the pharmacist with each prescription and refill. Be sure to read this information carefully each time. Talk to your pediatrician regarding the use of this medicine in children. While this drug may be prescribed for children as young as 21 years of age for selected conditions, precautions do apply. Overdosage: If you think you have taken too much of this medicine contact a poison control center or emergency room at once. NOTE: This medicine is only for you. Do not share this medicine with others. What  if I miss a dose? If you miss a dose, take it as soon as you can. If it is almost time for your next dose, take only that dose. Do not take double or extra doses. What may interact with this medicine? Do not take this medicine with any of the following medications: -certain medicines for fungal infections like fluconazole, itraconazole, ketoconazole, posaconazole, voriconazole -cisapride -dofetilide -dronedarone -pimozide -thioridazine -ziprasidone This medicine may also interact with the following medications: -carbamazepine -certain medicines for blood pressure -erythromycin -fluoxetine -grapefruit juice -other medicines that prolong the QT interval (cause an abnormal heart rhythm) -paroxetine -quinidine -valproic acid This list may not describe all possible interactions. Give your health care provider a list of all the medicines, herbs, non-prescription drugs, or dietary supplements you use. Also tell them if you smoke, drink alcohol, or use illegal drugs. Some items may interact with your medicine. What should I watch for while using this medicine? Visit your doctor or health care professional for regular checks on your progress. It may be several weeks before you see the full effects of this medicine. Do not suddenly stop taking this medicine. You may need to gradually reduce the dose. Patients and their families should watch out for worsening depression or thoughts of suicide. Also watch out for sudden changes in feelings such as feeling anxious, agitated, panicky, irritable, hostile, aggressive, impulsive, severely restless, overly excited and hyperactive, or not being able to sleep. If this happens, especially at the beginning of antidepressant treatment or after a change in dose, call your health care professional. Dennis Bast may get dizzy or drowsy. Do not drive, use machinery, or do anything that needs  mental alertness until you know how this medicine affects you. Do not stand or sit up  quickly, especially if you are an older patient. This reduces the risk of dizzy or fainting spells. Alcohol can increase dizziness and drowsiness. Avoid alcoholic drinks. This medicine can reduce the response of your body to heat or cold. Dress warm in cold weather and stay hydrated in hot weather. If possible, avoid extreme temperatures like saunas, hot tubs, very hot or cold showers, or activities that can cause dehydration such as vigorous exercise. This medicine may cause dry eyes and blurred vision. If you wear contact lenses you may feel some discomfort. Lubricating drops may help. See your eye doctor if the problem does not go away or is severe. If you notice an increased hunger or thirst, different from your normal hunger or thirst, or if you find that you have to urinate more frequently, you should contact your health care provider as soon as possible. You may need to have your blood sugar monitored. This medicine may cause changes in your blood sugar levels. You should monitor you blood sugar frequently if you have diabetes. There have been reports of uncontrollable and strong urges to gamble, binge eat, shop, and have sex while taking this medicine. If you experience any of these or other uncontrollable and strong urges while taking this medicine, you should report it to your health care provider as soon as possible. What side effects may I notice from receiving this medicine? Side effects that you should report to your doctor or health care professional as soon as possible: -allergic reactions like skin rash, itching or hives, swelling of the face, lips, or tongue -breathing problems -confusion -feeling faint or lightheaded, falls -fever or chills, sore throat -increased hunger or thirst -increased urination -joint pain -muscles pain, spasms -problems with balance, talking, walking -restlessness or need to keep moving -seizures -suicidal thoughts or other mood changes -trouble  swallowing -uncontrollable and excessive urges (examples: gambling, binge eating, shopping, having sex) -uncontrollable head, mouth, neck, arm, or leg movements -unusually weak or tired Side effects that usually do not require medical attention (report to your doctor or health care professional if they continue or are bothersome): -blurred vision -constipation -headache -nausea, vomiting -trouble sleeping -weight gain This list may not describe all possible side effects. Call your doctor for medical advice about side effects. You may report side effects to FDA at 1-800-FDA-1088. Where should I keep my medicine? Keep out of the reach of children. Store at room temperature between 15 and 30 degrees C (59 and 86 degrees F). Throw away any unused medicine after the expiration date. NOTE: This sheet is a summary. It may not cover all possible information. If you have questions about this medicine, talk to your doctor, pharmacist, or health care provider.    2016, Elsevier/Gold Standard. (2014-06-15 15:37:42)

## 2015-07-19 NOTE — Assessment & Plan Note (Signed)
Following with neurology. Newly diagnosed. Continue to follow.

## 2015-07-19 NOTE — Progress Notes (Signed)
BP 116/76 mmHg  Pulse 93  Temp(Src) 99 F (37.2 C)  Ht 5' 2.5" (1.588 m)  Wt 138 lb (62.596 kg)  BMI 24.82 kg/m2  SpO2 93%   Subjective:    Patient ID: Heather King, female    DOB: [birth year], 35 y.o.   MRN: [id number]  HPI: Heather King is a 35 y.o. female  Chief Complaint  Patient presents with  . Establish Care    Patient is here to establish care   Has had anxiety for as long as she can remember. SHe notes that she was on ativan in the past and came off of it about 2 years ago. Has seen psychiatry at [facility] in the past and he increased her effexor to 300mg  daily. Tried her on hydroxyzine 25mg  4x a day in the past without benefit- never tried on any other medicine. Was on doxipin in the past as well without benefit. Seeing psychiatrist at [facility] in about 2 weeks. She has been on the effexor for about 10 years. She doesn't note much of a difference with her medicine. Lamictal in the past without benefit. Has been on ativan, klonopin, xanax- helped, but didn't want to take them. Was on buspar in the past without benefit. Has tried seroquel in the past, thinks that might have made her gain 30lbs in the last year. It did help with her sleep, but is not sure if it helped with anything else.   Has been seeing someone at [facility] for [place]- just got changed to the [place] to see him again in July.  Has chronic back pain. Takes about 6 pills of her norco a month. Has a history of viral meningitis. Has had problems with headaches since then.    Active Ambulatory Problems    Diagnosis Date Noted  . Anxiety disorder 07/19/2015  . Huntington's disease ([place]) 07/19/2015  . Chronic low back pain 07/19/2015  . Chronic headaches 07/19/2015  . GERD (gastroesophageal reflux disease) 07/19/2015  . ADD (attention deficit disorder) 07/19/2015   Resolved Ambulatory Problems    Diagnosis Date Noted  . Viral meningitis 01/17/2015   Past Medical History  Diagnosis Date  . Depression    . Anxiety   . Bipolar disorder ([place])   . Chronic pain syndrome   . Insomnia due to medical condition   . Cervical pain   . Aseptic meningitis   . Acute headache   . Rectal pain   . Rectal bleeding   . History of cervical dysplasia   . Mild neurocognitive disorder   . Seizure-like activity ([place])   . Chronic tension type headache   . Loss of memory   . Huntington disease ([place])   . Cancer (H[facility])    Allergies  Allergen Reactions  . Gabapentin Rash  . Nifedipine Rash    To cream   Past Surgical History  Procedure Laterality Date  . Abdominal hysterectomy  2013  . Cholecystectomy  2012  . Leep  2007   Social History   Social History  . Marital Status: Married    Spouse Name: N/A  . Number of Children: N/A  . Years of Education: N/A   Social History Main Topics  . Smoking status: Never Smoker   . Smokeless tobacco: Never Used  . Alcohol Use: Yes     Comment: On occasion  . Drug Use: No  . Sexual Activity: Yes    Birth Control/ Protection: Surgical   Other Topics Concern  .  None   Social History Narrative   Family History  Problem Relation Age of Onset  . Hypertension Mother   . Diabetes Mother   . Mental illness Mother   . Depression Mother   . Cancer Father     lung  . Alcohol abuse Father   . Diabetes Brother   . ADD / ADHD Brother   . Dementia Maternal Grandmother   . Cancer Maternal Grandmother     breast  . Dementia Maternal Grandfather   . Cancer Maternal Grandfather     Lung   Review of Systems  Constitutional: Negative.   Respiratory: Negative.   Cardiovascular: Negative.   Neurological: Negative.   Psychiatric/Behavioral: Negative.     Per HPI unless specifically indicated above     Objective:    BP 116/76 mmHg  Pulse 93  Temp(Src) 99 F (37.2 C)  Ht 5' 2.5" (1.588 m)  Wt 138 lb (62.596 kg)  BMI 24.82 kg/m2  SpO2 93%  Wt Readings from Last 3 Encounters:  07/19/15 138 lb (62.596 kg)  05/28/15 132 lb (59.875 kg)  01/18/15  140 lb 6.4 oz (63.685 kg)    Physical Exam  Constitutional: She is oriented to person, place, and time. She appears well-developed and well-nourished. No distress.  HENT:  Head: Normocephalic and atraumatic.  Right Ear: Hearing normal.  Left Ear: Hearing normal.  Nose: Nose normal.  Eyes: Conjunctivae and lids are normal. Right eye exhibits no discharge. Left eye exhibits no discharge. No scleral icterus.  Cardiovascular: Normal rate, regular rhythm, normal heart sounds and intact distal pulses.  Exam reveals no gallop and no friction rub.   No murmur heard. Pulmonary/Chest: Effort normal and breath sounds normal. No respiratory distress. She has no wheezes. She has no rales. She exhibits no tenderness.  Musculoskeletal: Normal range of motion.  Neurological: She is alert and oriented to person, place, and time.  Skin: Skin is warm, dry and intact. No rash noted. No erythema. No pallor.  Psychiatric: She has a normal mood and affect. Her speech is normal and behavior is normal. Judgment and thought content normal. Cognition and memory are normal.  Nursing note and vitals reviewed.   Results for orders placed or performed in visit on 07/06/15  HM PAP SMEAR  Result Value Ref Range   HM Pap smear per CE       Assessment & Plan:   Problem List Items Addressed This Visit      Digestive   GERD (gastroesophageal reflux disease)    Under good control on her omeprazole. Continue current regimen. Continue to monitor.         Nervous and Auditory   Huntington's disease [facility])    Following with neurology. Newly diagnosed. Continue to follow.         Other   Anxiety disorder - Primary    Not under good control. To see a new psychiatrist in about 2 weeks. Discussed that this is a good idea. Will try her on abilify prior to her seeing new psychiatrist. Continue to monitor.       Chronic low back pain    Takes 6 norco a month. Uses very appropriately. Will obtain records from previous  provider. Continue to monitor.       Relevant Medications   HYDROcodone-acetaminophen (NORCO) 10-325 MG tablet   Chronic headaches    Did not do well on any medication. Likely due to hx of viral menigitis. Continue to monitor. Call with concerns.  Relevant Medications   HYDROcodone-acetaminophen (NORCO) 10-325 MG tablet   ADD (attention deficit disorder)    Stable on her current regimen. Will obtain notes from previous provider. Call with any concerns.           Follow up plan: Return in about 4 weeks (around 08/16/2015) for Follow up mood and back.

## 2015-07-19 NOTE — Assessment & Plan Note (Signed)
Stable on her current regimen. Will obtain notes from previous provider. Call with any concerns.

## 2015-07-19 NOTE — Assessment & Plan Note (Signed)
Not under good control. To see a new psychiatrist in about 2 weeks. Discussed that this is a good idea. Will try her on abilify prior to her seeing new psychiatrist. Continue to monitor.

## 2015-08-16 ENCOUNTER — Ambulatory Visit (INDEPENDENT_AMBULATORY_CARE_PROVIDER_SITE_OTHER): Payer: Medicaid Other | Admitting: Family Medicine

## 2015-08-16 ENCOUNTER — Encounter: Payer: Self-pay | Admitting: Family Medicine

## 2015-08-16 VITALS — BP 112/77 | HR 98 | Temp 98.1°F | Ht 64.0 in | Wt 145.0 lb

## 2015-08-16 DIAGNOSIS — N6452 Nipple discharge: Secondary | ICD-10-CM | POA: Diagnosis not present

## 2015-08-16 DIAGNOSIS — M545 Low back pain, unspecified: Secondary | ICD-10-CM

## 2015-08-16 DIAGNOSIS — R5383 Other fatigue: Secondary | ICD-10-CM | POA: Diagnosis not present

## 2015-08-16 DIAGNOSIS — Z79899 Other long term (current) drug therapy: Secondary | ICD-10-CM

## 2015-08-16 DIAGNOSIS — G8929 Other chronic pain: Secondary | ICD-10-CM

## 2015-08-16 MED ORDER — HYDROCODONE-ACETAMINOPHEN 10-325 MG PO TABS: ORAL_TABLET | ORAL | Status: DC

## 2015-08-16 NOTE — Patient Instructions (Signed)
Galactorrhea Galactorrhea is an abnormal milky discharge from the breast. The discharge may come from one or both nipples. The fluid is often white, yellow, or green. It is different from the normal milk produced in nursing mothers. Galactorrhea usually occurs in women, but it can sometimes affect men. Various things can cause galactorrhea. It is often caused by irritation of the breast, which can result from injury, stimulation during sexual activity, or clothes rubbing against the nipple. It may also be related to medicines or changes in hormone levels. In many cases, galactorrhea will go away without treatment. However, galactorrhea can also be a sign of something more serious, such as diseases of the kidney or thyroid or problems with the pituitary gland. Your health care provider may do various tests to help determine the cause. Sometimes the cause is unknown. It is important to monitor your condition to make sure that it goes away. HOME CARE INSTRUCTIONS  Watch your condition for any changes. The following actions may help to lessen any discomfort that you are feeling:  Take medicines only as directed by your health care provider.  Do not squeeze your breasts or nipples.  Avoid breast stimulation during sexual activity.   Perform a breast self-exam once a month. Doing this more often can irritate your breasts.  Avoid clothes that rub on your nipples.  Use breast pads to absorb the discharge.  Wear a breast binder or a support bra to help prevent clothes from rubbing on your nipples.  Keep all follow-up visits as directed by your health care provider. This is important. SEEK MEDICAL CARE IF:  You develop hot flashes, vaginal dryness, or a lack of sexual desire.  You stop having menstrual periods, or they are irregular or far apart.  You have headaches.  You have vision problems. SEEK IMMEDIATE MEDICAL CARE IF:  You have breast discharge that is bloody or puslike.  You have  breast pain.  You feel a lump in your breast.  You have wrinkling or dimpling on your breast.  Your breast becomes red and swollen.   This information is not intended to replace advice given to you by your health care provider. Make sure you discuss any questions you have with your health care provider.   Document Released: 03/06/2004 Document Revised: 02/17/2014 Document Reviewed: 08/30/2013 Elsevier Interactive Patient Education [employer].

## 2015-08-16 NOTE — Progress Notes (Signed)
BP 112/77 mmHg  Pulse 98  Temp(Src) 98.1 F (36.7 C)  Ht 5\' 4"  (1.626 m)  Wt 145 lb (65.772 kg)  BMI 24.88 kg/m2  SpO2 99%   Subjective:    Patient ID: Heather King, female    DOB: 1980-04-25, 35 y.o.   MRN: [id number]  HPI: Heather King is a 34 y.o. female  Chief Complaint  Patient presents with  . Back Pain  . stopped Abilify  . breast discharge, bitlateral   Saw her new psychiatrist. She liked her. The psychiatrist increased the abilify. It made her feel weird, so she stopped it. She has taken on all of her medicine except the prilosec and the norco. Doing better. Seeing her again today.  BREAST PAIN Duration : Couple of weeks Location: bilateral Onset: gradual Severity: moderate Quality: aching, tender, bruised Frequency: intermittent Redness: no Swelling: yes- maybe Trauma: no trauma Breastfeeding: no Associated with menstral cycle: no Nipple discharge: yes, white Breast lump: no Status: stable Treatments attempted: none Previous mammogram: no   Not seeing neurology until 7/26. Short term memory doing a bit worse.   BACK PAIN- Only takes medicine about 6x a month with bad pain. Has been doing a little worse recently because she's been out of medicine. PMP reviewed, and has gotten several Rxs in the past, mainly for migraines, but not taking the medicine for her migraines any more, now just for her back. Duration: chronic Mechanism of injury: skiing on a boat and hurt her back Location: bilateral and low back Onset: sudden Severity: 6/10 Quality: dull and aching Frequency: intermittent Radiation: none Aggravating factors: none, bending and prolonged sitting Alleviating factors: heat, laying and narcotics Status: stable Treatments attempted: chiropractry, rest, ice, heat, APAP, ibuprofen, aleve, physical therapy and HEP  Relief with NSAIDs?: mild Nighttime pain:  no Paresthesias / decreased sensation:  no Bowel / bladder incontinence:  no Fevers:   no Dysuria / urinary frequency:  no  Relevant past medical, surgical, family and social history reviewed and updated as indicated. Interim medical history since our last visit reviewed. Allergies and medications reviewed and updated.  Review of Systems  Constitutional: Negative.   Respiratory: Negative.   Cardiovascular: Negative.   Musculoskeletal: Positive for myalgias and back pain. Negative for joint swelling, arthralgias, gait problem, neck pain and neck stiffness.  Psychiatric/Behavioral: Negative.    Per HPI unless specifically indicated above     Objective:    BP 112/77 mmHg  Pulse 98  Temp(Src) 98.1 F (36.7 C)  Ht 5\' 4"  (1.626 m)  Wt 145 lb (65.772 kg)  BMI 24.88 kg/m2  SpO2 99%  Wt Readings from Last 3 Encounters:  08/16/15 145 lb (65.772 kg)  07/19/15 138 lb (62.596 kg)  05/28/15 132 lb (59.875 kg)    Physical Exam  Constitutional: She is oriented to person, place, and time. She appears well-developed and well-nourished. No distress.  HENT:  Head: Normocephalic and atraumatic.  Right Ear: Hearing normal.  Left Ear: Hearing normal.  Nose: Nose normal.  Eyes: Conjunctivae and lids are normal. Right eye exhibits no discharge. Left eye exhibits no discharge. No scleral icterus.  Cardiovascular: Normal rate, regular rhythm, normal heart sounds and intact distal pulses.  Exam reveals no gallop and no friction rub.   No murmur heard. Pulmonary/Chest: Effort normal and breath sounds normal. No respiratory distress. She has no wheezes. She has no rales. She exhibits no tenderness.  Musculoskeletal: Normal range of motion.  Neurological: She is alert and oriented  to person, place, and time.  Skin: Skin is warm, dry and intact. No rash noted. No erythema. No pallor.  Psychiatric: She has a normal mood and affect. Her speech is normal and behavior is normal. Judgment and thought content normal. Cognition and memory are normal.  Nursing note and vitals  reviewed.   Results for orders placed or performed in visit on 07/06/15  HM PAP SMEAR  Result Value Ref Range   HM Pap smear per CE       Assessment & Plan:   Problem List Items Addressed This Visit      Other   Chronic low back pain    PMP reviewed and appropriate. Seeing psychiatry for her other meds. Controlled substance agreement signed. Will start medicine. 40 pills should last 6 months. Call with any concerns. No recent imaging- x-ray order put in today. Await results.       Relevant Medications   HYDROcodone-acetaminophen (NORCO) 10-325 MG tablet   Controlled substance agreement signed    Other Visit Diagnoses    Breast discharge    -  Primary    Likely due to her abilify. Will check labs. Will obtain mammogram and ultrasounds. Await results.     Relevant Orders    MM Digital Diagnostic Bilat    US BREAST COMPLETE UNI RIGHT INC AXILLA    US BREAST COMPLETE UNI LEFT INC AXILLA    Prolactin    Comprehensive metabolic panel    TSH    Other fatigue        Will check labs. Await results.     Relevant Orders    B12 and Folate Panel    Bilateral low back pain without sciatica        Relevant Medications    HYDROcodone-acetaminophen (NORCO) 10-325 MG tablet    Other Relevant Orders    DG Lumbar Spine Complete        Follow up plan: Return 2-3 months.

## 2015-08-16 NOTE — Assessment & Plan Note (Signed)
PMP reviewed and appropriate. Seeing psychiatry for her other meds. Controlled substance agreement signed. Will start medicine. 40 pills should last 6 months. Call with any concerns. No recent imaging- x-ray order put in today. Await results.

## 2015-08-17 ENCOUNTER — Telehealth: Payer: Self-pay | Admitting: Family Medicine

## 2015-08-17 DIAGNOSIS — R7989 Other specified abnormal findings of blood chemistry: Secondary | ICD-10-CM

## 2015-08-17 DIAGNOSIS — E229 Hyperfunction of pituitary gland, unspecified: Principal | ICD-10-CM

## 2015-08-17 LAB — COMPREHENSIVE METABOLIC PANEL
ALT: 36 IU/L — AB (ref 0–32)
AST: 16 IU/L (ref 0–40)
Albumin/Globulin Ratio: 1.8 (ref 1.2–2.2)
Albumin: 4.4 g/dL (ref 3.5–5.5)
Alkaline Phosphatase: 103 IU/L (ref 39–117)
BUN/Creatinine Ratio: 20 (ref 9–23)
BUN: 13 mg/dL (ref 6–20)
CALCIUM: 9.3 mg/dL (ref 8.7–10.2)
CHLORIDE: 99 mmol/L (ref 96–106)
CO2: 23 mmol/L (ref 18–29)
Creatinine, Ser: 0.64 mg/dL (ref 0.57–1.00)
GFR calc non Af Amer: 116 mL/min/{1.73_m2} (ref >59)
GFR, EST AFRICAN AMERICAN: 134 mL/min/{1.73_m2} (ref >59)
GLUCOSE: 80 mg/dL (ref 65–99)
Globulin, Total: 2.5 g/dL (ref 1.5–4.5)
Potassium: 3.9 mmol/L (ref 3.5–5.2)
Sodium: 141 mmol/L (ref 134–144)
Total Protein: 6.9 g/dL (ref 6.0–8.5)

## 2015-08-17 LAB — PROLACTIN: PROLACTIN: 26.8 ng/mL — AB (ref 4.8–23.3)

## 2015-08-17 LAB — TSH: TSH: 2.87 u[IU]/mL (ref 0.450–4.500)

## 2015-08-17 LAB — B12 AND FOLATE PANEL
FOLATE: 7.6 ng/mL (ref >3.0)
Vitamin B-12: 563 pg/mL (ref 211–946)

## 2015-08-17 NOTE — Telephone Encounter (Signed)
Patient notified

## 2015-08-17 NOTE — Telephone Encounter (Signed)
Please let her know that her labs came back nice and normal except her prolactin- which is just borderline high and can go up with the use of abilify. We'll just recheck it next visit to make sure it goes back to normal. It can cause discharge out of the breasts though, so that may be the cause of it. I still want her to keep her appointment at the breast center though. Thanks!

## 2015-08-27 ENCOUNTER — Ambulatory Visit: Payer: Medicaid Other

## 2015-08-27 ENCOUNTER — Inpatient Hospital Stay: Admission: RE | Admit: 2015-08-27 | Payer: Medicaid Other | Source: Ambulatory Visit

## 2015-08-31 DIAGNOSIS — G43709 Chronic migraine without aura, not intractable, without status migrainosus: Secondary | ICD-10-CM | POA: Insufficient documentation

## 2015-09-05 ENCOUNTER — Ambulatory Visit
Admission: RE | Admit: 2015-09-05 | Discharge: 2015-09-05 | Disposition: A | Discharge status: Home / Self Care | Payer: Medicaid Other | Source: Ambulatory Visit | Attending: Family Medicine | Admitting: Family Medicine

## 2015-09-05 ENCOUNTER — Other Ambulatory Visit: Payer: Self-pay | Admitting: Family Medicine

## 2015-09-05 DIAGNOSIS — N6452 Nipple discharge: Secondary | ICD-10-CM | POA: Diagnosis present

## 2015-09-05 HISTORY — DX: Nipple discharge: N64.52

## 2015-09-06 ENCOUNTER — Encounter: Payer: Self-pay | Admitting: Family Medicine

## 2015-10-18 ENCOUNTER — Ambulatory Visit (INDEPENDENT_AMBULATORY_CARE_PROVIDER_SITE_OTHER): Payer: Self-pay | Admitting: Family Medicine

## 2015-10-18 ENCOUNTER — Encounter: Payer: Self-pay | Admitting: Family Medicine

## 2015-10-18 VITALS — BP 126/83 | HR 92 | Temp 98.6°F | Wt 141.0 lb

## 2015-10-18 DIAGNOSIS — K219 Gastro-esophageal reflux disease without esophagitis: Secondary | ICD-10-CM

## 2015-10-18 DIAGNOSIS — R7989 Other specified abnormal findings of blood chemistry: Secondary | ICD-10-CM

## 2015-10-18 DIAGNOSIS — M545 Low back pain: Secondary | ICD-10-CM

## 2015-10-18 DIAGNOSIS — G8929 Other chronic pain: Secondary | ICD-10-CM

## 2015-10-18 DIAGNOSIS — E229 Hyperfunction of pituitary gland, unspecified: Secondary | ICD-10-CM

## 2015-10-18 MED ORDER — HYDROCODONE-ACETAMINOPHEN 10-325 MG PO TABS: ORAL_TABLET | ORAL | 0 refills | Status: DC

## 2015-10-18 MED ORDER — ESOMEPRAZOLE MAGNESIUM 40 MG PO CPDR: 40.0000 mg | DELAYED_RELEASE_CAPSULE | Freq: Every day | ORAL | 3 refills | Status: DC

## 2015-10-18 NOTE — Progress Notes (Signed)
BP 126/83 (BP Location: Left Arm, Patient Position: Sitting, Cuff Size: Normal)   Pulse 92   Temp 98.6 F (37 C)   Wt 141 lb (64 kg)   SpO2 100%   BMI 24.20 kg/m    Subjective:    Patient ID: Heather King, female    DOB: [birth year], 35 y.o.   MRN: [id number]  HPI: Heather King is a 34 y.o. female  Chief Complaint  Patient presents with  . Pain  . Gastroesophageal Reflux   Not feeling like herself, feeling really tired. Having no energy.   BACK PAIN- Only takes medicine about 6x a month with bad pain. Has been doing a little worse recently because she's been out of medicine. PMP reviewed and appropriate, exacerbated because she has been trying to do more, so she ran out of her medicine faster.  Duration: chronic Mechanism of injury: skiing on a boat and hurt her back Location: bilateral and low back Onset: sudden Severity: 6/10 Quality: dull and aching Frequency: intermittent Radiation: none Aggravating factors: none, bending and prolonged sitting Alleviating factors: heat, laying and narcotics Status: stable Treatments attempted: chiropractry, rest, ice, heat, APAP, ibuprofen, aleve, physical therapy and HEP  Relief with NSAIDs?: mild Nighttime pain:  no Paresthesias / decreased sensation:  no Bowel / bladder incontinence:  no Fevers:  no Dysuria / urinary frequency:  no  GERD GERD control status: uncontrolled  Satisfied with current treatment? no Heartburn frequency: 3x a week Medication side effects: no  Medication compliance: excellent compliance Previous GERD medications: Nexium Antacid use frequency:  daily Duration: several hours Nature: burning  Location: substernal Heartburn duration: several hours Alleviatiating factors:  zantac Aggravating factors: eating, stress Dysphagia: no Odynophagia:  no Hematemesis: no Blood in stool: no  Relevant past medical, surgical, family and social history reviewed and updated as indicated. Interim medical  history since our last visit reviewed. Allergies and medications reviewed and updated.  Review of Systems  Constitutional: Negative.   Respiratory: Negative.   Cardiovascular: Negative.   Musculoskeletal: Positive for back pain, gait problem and myalgias. Negative for arthralgias, joint swelling, neck pain and neck stiffness.  Neurological: Negative for dizziness, tremors, seizures, syncope, facial asymmetry, speech difficulty, weakness, light-headedness, numbness and headaches.  Psychiatric/Behavioral: Negative.     Per HPI unless specifically indicated above     Objective:    BP 126/83 (BP Location: Left Arm, Patient Position: Sitting, Cuff Size: Normal)   Pulse 92   Temp 98.6 F (37 C)   Wt 141 lb (64 kg)   SpO2 100%   BMI 24.20 kg/m   Wt Readings from Last 3 Encounters:  10/18/15 141 lb (64 kg)  08/16/15 145 lb (65.8 kg)  07/19/15 138 lb (62.6 kg)    Physical Exam  Constitutional: She is oriented to person, place, and time. She appears well-developed and well-nourished. No distress.  HENT:  Head: Normocephalic and atraumatic.  Right Ear: Hearing normal.  Left Ear: Hearing normal.  Nose: Nose normal.  Eyes: Conjunctivae and lids are normal. Right eye exhibits no discharge. Left eye exhibits no discharge. No scleral icterus.  Cardiovascular: Normal rate, regular rhythm, normal heart sounds and intact distal pulses.  Exam reveals no gallop and no friction rub.   No murmur heard. Pulmonary/Chest: Effort normal and breath sounds normal. No respiratory distress. She has no wheezes. She has no rales. She exhibits no tenderness.  Musculoskeletal: Normal range of motion.  Neurological: She is alert and oriented to person, place, and  time.  Skin: Skin is warm, dry and intact. No rash noted. She is not diaphoretic. No erythema. No pallor.  Psychiatric: She has a normal mood and affect. Her speech is normal and behavior is normal. Judgment and thought content normal. Cognition and  memory are normal.  Nursing note and vitals reviewed.   Results for orders placed or performed in visit on 08/16/15  Prolactin  Result Value Ref Range   Prolactin 26.8 (H) 4.8 - 23.3 ng/mL  Comprehensive metabolic panel  Result Value Ref Range   Glucose 80 65 - 99 mg/dL   BUN 13 6 - 20 mg/dL   Creatinine, Ser 0.64 0.57 - 1.00 mg/dL   GFR calc non Af Amer 116 >59 mL/min/1.73   GFR calc Af Amer 134 >59 mL/min/1.73   BUN/Creatinine Ratio 20 9 - 23   Sodium 141 134 - 144 mmol/L   Potassium 3.9 3.5 - 5.2 mmol/L   Chloride 99 96 - 106 mmol/L   CO2 23 18 - 29 mmol/L   Calcium 9.3 8.7 - 10.2 mg/dL   Total Protein 6.9 6.0 - 8.5 g/dL   Albumin 4.4 3.5 - 5.5 g/dL   Globulin, Total 2.5 1.5 - 4.5 g/dL   Albumin/Globulin Ratio 1.8 1.2 - 2.2   Bilirubin Total <0.2 0.0 - 1.2 mg/dL   Alkaline Phosphatase 103 39 - 117 IU/L   AST 16 0 - 40 IU/L   ALT 36 (H) 0 - 32 IU/L  TSH  Result Value Ref Range   TSH 2.870 0.450 - 4.500 uIU/mL  B12 and Folate Panel  Result Value Ref Range   Vitamin B-12 563 211 - 946 pg/mL   Folate 7.6 >3.0 ng/mL      Assessment & Plan:   Problem List Items Addressed This Visit      Digestive   GERD (gastroesophageal reflux disease)    Not under good control. Will change to nexium which worked well for her in the past. Call with any concerns. Recheck 1 month.       Relevant Medications   esomeprazole (NEXIUM) 40 MG capsule     Endocrine   Elevated prolactin level (HCC)    Issues with her insurance. Will recheck next visit when sorted.         Other   Chronic low back pain - Primary    PMP reviewed and appropriate. Seeing psychiatry for her other meds. Controlled substance agreement signed previously. In exacerbation and used all 40 pills in 1 month. Will refill, and follow up in 1 month. Will start on 28 day program next month depending on how much medication she is using. Unable to obtain records of her x-ray, which she states that she had done. Will  reorder and she will get when her insurance is sorted. Follow up 1 month.       Relevant Medications   HYDROcodone-acetaminophen (NORCO) 10-325 MG tablet    Other Visit Diagnoses   None.      Follow up plan: Return in about 4 weeks (around 11/15/2015) for Follow up chronic pain.

## 2015-10-18 NOTE — Assessment & Plan Note (Signed)
Not under good control. Will change to nexium which worked well for her in the past. Call with any concerns. Recheck 1 month.

## 2015-10-18 NOTE — Assessment & Plan Note (Addendum)
PMP reviewed and appropriate. Seeing psychiatry for her other meds. Controlled substance agreement signed previously. In exacerbation and used all 40 pills in 1 month. Will refill, and follow up in 1 month. Will start on 28 day program next month depending on how much medication she is using. Unable to obtain records of her x-ray, which she states that she had done. Will reorder and she will get when her insurance is sorted. Follow up 1 month.

## 2015-10-18 NOTE — Assessment & Plan Note (Signed)
Issues with her insurance. Will recheck next visit when sorted.

## 2015-10-23 ENCOUNTER — Telehealth: Payer: Self-pay | Admitting: Family Medicine

## 2015-10-23 NOTE — Telephone Encounter (Signed)
Tried to call patient, no answer,unable to leave message will try again.  

## 2015-10-23 NOTE — Telephone Encounter (Signed)
Pt would like to speak to PCP about weight gain phentermine 37.5 mg sent to [facility].

## 2015-10-24 NOTE — Telephone Encounter (Signed)
Tried to call patient, no answer, left message for patient to return my call if she still has questions.

## 2015-11-14 ENCOUNTER — Telehealth: Payer: Self-pay | Admitting: Family Medicine

## 2015-11-14 NOTE — Telephone Encounter (Signed)
Pt called would like a refill on hydrocodone. Patient has appt 12/12/15 due to issues with insurance. Pharm is [facility]. Thanks.

## 2015-11-15 ENCOUNTER — Ambulatory Visit: Payer: Medicaid Other | Admitting: Family Medicine

## 2015-11-15 MED ORDER — HYDROCODONE-ACETAMINOPHEN 10-325 MG PO TABS: ORAL_TABLET | ORAL | 0 refills | Status: DC

## 2015-11-15 NOTE — Telephone Encounter (Signed)
OK- Rx up front. This will be the only time we can do this without seeing her again

## 2015-11-15 NOTE — Telephone Encounter (Signed)
Patient notified

## 2015-12-12 ENCOUNTER — Encounter: Payer: Self-pay | Admitting: Family Medicine

## 2015-12-12 ENCOUNTER — Ambulatory Visit (INDEPENDENT_AMBULATORY_CARE_PROVIDER_SITE_OTHER): Payer: Self-pay | Admitting: Family Medicine

## 2015-12-12 VITALS — BP 109/72 | HR 85 | Temp 98.4°F | Wt 141.5 lb

## 2015-12-12 DIAGNOSIS — L259 Unspecified contact dermatitis, unspecified cause: Secondary | ICD-10-CM

## 2015-12-12 DIAGNOSIS — M545 Low back pain: Secondary | ICD-10-CM

## 2015-12-12 DIAGNOSIS — G8929 Other chronic pain: Secondary | ICD-10-CM

## 2015-12-12 MED ORDER — PREDNISONE 10 MG PO TABS
ORAL_TABLET | ORAL | 0 refills | Status: DC
Start: 2015-12-12 — End: 2016-04-08

## 2015-12-12 MED ORDER — HYDROCODONE-ACETAMINOPHEN 10-325 MG PO TABS: ORAL_TABLET | ORAL | 0 refills | Status: DC

## 2015-12-12 NOTE — Assessment & Plan Note (Signed)
PMP reviewed and appropriate. Seeing psychiatry for her other meds. Controlled substance agreement signed previously. Seems to be stable using all 40 pills in 1 month. Will refill, and start on 28 day program. Follow up 3 months on back pain.

## 2015-12-12 NOTE — Progress Notes (Signed)
BP 109/72 (BP Location: Left Arm, Patient Position: Sitting, Cuff Size: Normal)   Pulse 85   Temp 98.4 F (36.9 C)   Wt 141 lb 8 oz (64.2 kg)   SpO2 99%   BMI 24.29 kg/m    Subjective:    Patient ID: Heather King, female    DOB: 01-Apr-1980, 35 y.o.   MRN: [id number]  HPI: CARLYN DESHLER is a 35 y.o. female  Chief Complaint  Patient presents with  . Pain   CHRONIC PAIN- medicine is helping. Has been taking it almost every day, has been doing more  Present dose: 15 Morphine equivalents/day Pain control status: controlled Duration: chronic Location: low back Quality: dull and aching Current Pain Level: 5/10 Previous Pain Level: 10/10 Breakthrough pain: yes Benefit from narcotic medications: yes What Activities task can be accomplished with current medication?: able to do things, take care of her life Interested in weaning off narcotics:no   Stool softners/OTC fiber: no  Previous pain specialty evaluation: yes Non-narcotic analgesic meds: yes- did not tolerate them Narcotic contract: yes  RASH Duration:  3-4 weeks  Location: trunk and face  Itching: yes Burning: no Redness: yes Oozing: no Scaling: no Blisters: no Painful: no Fevers: no Change in detergents/soaps/personal care products: no Recent illness: no Recent travel:no History of same: no Context: stable Alleviating factors: nothing Treatments attempted:nystatin cream, 10 day course of prednisone, hydrocortisone cream, benadryl, OTC anit-fungal and lotion/moisturizer Shortness of breath: no  Throat/tongue swelling: no Myalgias/arthralgias: no   Relevant past medical, surgical, family and social history reviewed and updated as indicated. Interim medical history since our last visit reviewed. Allergies and medications reviewed and updated.  Review of Systems  Constitutional: Negative.   Respiratory: Negative.   Cardiovascular: Negative.   Musculoskeletal: Positive for back pain, gait problem and  myalgias. Negative for arthralgias, joint swelling, neck pain and neck stiffness.  Skin: Positive for rash. Negative for color change, pallor and wound.  Psychiatric/Behavioral: Positive for confusion, decreased concentration, dysphoric mood and sleep disturbance. Negative for agitation, behavioral problems, hallucinations, self-injury and suicidal ideas. The patient is nervous/anxious. The patient is not hyperactive.     Per HPI unless specifically indicated above     Objective:    BP 109/72 (BP Location: Left Arm, Patient Position: Sitting, Cuff Size: Normal)   Pulse 85   Temp 98.4 F (36.9 C)   Wt 141 lb 8 oz (64.2 kg)   SpO2 99%   BMI 24.29 kg/m   Wt Readings from Last 3 Encounters:  12/12/15 141 lb 8 oz (64.2 kg)  10/18/15 141 lb (64 kg)  08/16/15 145 lb (65.8 kg)    Physical Exam  Constitutional: She is oriented to person, place, and time. She appears well-developed and well-nourished. No distress.  HENT:  Head: Normocephalic and atraumatic.  Right Ear: Hearing normal.  Left Ear: Hearing normal.  Nose: Nose normal.  Eyes: Conjunctivae and lids are normal. Right eye exhibits no discharge. Left eye exhibits no discharge. No scleral icterus.  Cardiovascular: Normal rate, regular rhythm, normal heart sounds and intact distal pulses.  Exam reveals no gallop and no friction rub.   No murmur heard. Pulmonary/Chest: Effort normal and breath sounds normal. No respiratory distress. She has no wheezes. She has no rales. She exhibits no tenderness.  Neurological: She is alert and oriented to person, place, and time.  Skin: Skin is warm, dry and intact. Rash (L neck onto R side and into groin) noted. No erythema. No pallor.  Psychiatric: She has a normal mood and affect. Her speech is normal and behavior is normal. Judgment and thought content normal. Cognition and memory are normal.  Nursing note and vitals reviewed.   Results for orders placed or performed in visit on 08/16/15    Prolactin  Result Value Ref Range   Prolactin 26.8 (H) 4.8 - 23.3 ng/mL  Comprehensive metabolic panel  Result Value Ref Range   Glucose 80 65 - 99 mg/dL   BUN 13 6 - 20 mg/dL   Creatinine, Ser 0.64 0.57 - 1.00 mg/dL   GFR calc non Af Amer 116 >59 mL/min/1.73   GFR calc Af Amer 134 >59 mL/min/1.73   BUN/Creatinine Ratio 20 9 - 23   Sodium 141 134 - 144 mmol/L   Potassium 3.9 3.5 - 5.2 mmol/L   Chloride 99 96 - 106 mmol/L   CO2 23 18 - 29 mmol/L   Calcium 9.3 8.7 - 10.2 mg/dL   Total Protein 6.9 6.0 - 8.5 g/dL   Albumin 4.4 3.5 - 5.5 g/dL   Globulin, Total 2.5 1.5 - 4.5 g/dL   Albumin/Globulin Ratio 1.8 1.2 - 2.2   Bilirubin Total <0.2 0.0 - 1.2 mg/dL   Alkaline Phosphatase 103 39 - 117 IU/L   AST 16 0 - 40 IU/L   ALT 36 (H) 0 - 32 IU/L  TSH  Result Value Ref Range   TSH 2.870 0.450 - 4.500 uIU/mL  B12 and Folate Panel  Result Value Ref Range   Vitamin B-12 563 211 - 946 pg/mL   Folate 7.6 >3.0 ng/mL      Assessment & Plan:   Problem List Items Addressed This Visit      Other   Chronic low back pain - Primary    PMP reviewed and appropriate. Seeing psychiatry for her other meds. Controlled substance agreement signed previously. Seems to be stable using all 40 pills in 1 month. Will refill, and start on 28 day program. Follow up 3 months on back pain.      Relevant Medications   HYDROcodone-acetaminophen (NORCO) 10-325 MG tablet   predniSONE (DELTASONE) 10 MG tablet    Other Visit Diagnoses    Contact dermatitis, unspecified contact dermatitis type, unspecified trigger       Will treat with 12 day prednisone taper. Call with any concerns. Recheck 2 weeks.        Follow up plan: Return in about 2 weeks (around 12/26/2015) for Follow up rash.

## 2015-12-26 ENCOUNTER — Ambulatory Visit: Payer: Self-pay | Admitting: Family Medicine

## 2016-01-07 ENCOUNTER — Other Ambulatory Visit: Payer: Self-pay | Admitting: Family Medicine

## 2016-01-07 MED ORDER — HYDROCODONE-ACETAMINOPHEN 10-325 MG PO TABS: ORAL_TABLET | ORAL | 0 refills | Status: DC

## 2016-01-28 ENCOUNTER — Other Ambulatory Visit: Payer: Self-pay | Admitting: Family Medicine

## 2016-01-28 MED ORDER — HYDROCODONE-ACETAMINOPHEN 10-325 MG PO TABS: ORAL_TABLET | ORAL | 0 refills | Status: DC

## 2016-03-10 ENCOUNTER — Other Ambulatory Visit: Payer: Self-pay | Admitting: Family Medicine

## 2016-03-10 MED ORDER — HYDROCODONE-ACETAMINOPHEN 10-325 MG PO TABS: ORAL_TABLET | ORAL | 0 refills | Status: DC

## 2016-04-07 ENCOUNTER — Other Ambulatory Visit: Payer: Self-pay | Admitting: Family Medicine

## 2016-04-07 MED ORDER — HYDROCODONE-ACETAMINOPHEN 10-325 MG PO TABS: ORAL_TABLET | ORAL | 0 refills | Status: DC

## 2016-04-08 ENCOUNTER — Encounter: Payer: Self-pay | Admitting: Family Medicine

## 2016-04-08 ENCOUNTER — Ambulatory Visit (INDEPENDENT_AMBULATORY_CARE_PROVIDER_SITE_OTHER): Payer: Self-pay | Admitting: Family Medicine

## 2016-04-08 VITALS — BP 115/79 | HR 99 | Temp 99.0°F | Resp 17 | Ht 64.0 in | Wt 138.0 lb

## 2016-04-08 DIAGNOSIS — M545 Low back pain, unspecified: Secondary | ICD-10-CM

## 2016-04-08 DIAGNOSIS — G8929 Other chronic pain: Secondary | ICD-10-CM

## 2016-04-08 DIAGNOSIS — T148XXA Other injury of unspecified body region, initial encounter: Secondary | ICD-10-CM

## 2016-04-08 DIAGNOSIS — G43109 Migraine with aura, not intractable, without status migrainosus: Secondary | ICD-10-CM

## 2016-04-08 MED ORDER — BUTALBITAL-APAP-CAFFEINE 50-300-40 MG PO CAPS: 1.0000 | ORAL_CAPSULE | ORAL | 0 refills | Status: DC | PRN

## 2016-04-08 MED ORDER — TOPIRAMATE 50 MG PO TABS: ORAL_TABLET | ORAL | 2 refills | Status: DC

## 2016-04-08 NOTE — Assessment & Plan Note (Signed)
PMP reviewed and appropriate. Seeing psychiatry for her other meds. Controlled substance agreement signed previously. Seems to be stable using all 40 pills in 1 month. Will refill and continue her on the 28 day program. Follow up 3 months on back pain. 

## 2016-04-08 NOTE — Progress Notes (Signed)
BP 115/79 (BP Location: Left Arm, Patient Position: Sitting, Cuff Size: Normal)   Pulse 99   Temp 99 F (37.2 C) (Oral)   Resp 17   Ht 5\' 4"  (1.626 m)   Wt 138 lb (62.6 kg)   SpO2 100%   BMI 23.69 kg/m    Subjective:    Patient ID: Heather King, female    DOB: [birth year], 36 y.o.   MRN: [id number]  HPI: Heather King is a 36 y.o. female  Chief Complaint  Patient presents with  . Follow-up    Rash and medication   No insurance at this time, so having some issues with that. Can't get in to see her neurologist right now.   Has been bruising really easily- fell about a month and a half ago and doesn't seem to be getting significantly better.   CHRONIC PAIN- medicine is helping. Has been stable on dose, does not need more Present dose: 15 Morphine equivalents/day Pain control status: controlled Duration: chronic Location: low back Quality: dull and aching Current Pain Level: 5/10 Previous Pain Level: 10/10 Breakthrough pain: yes Benefit from narcotic medications: yes What Activities task can be accomplished with current medication?: able to do things, take care of her life Interested in weaning off narcotics:no   Stool softners/OTC fiber: no  Previous pain specialty evaluation: yes Non-narcotic analgesic meds: yes- did not tolerate them Narcotic contract: yes  RASH- resolved with the prednisone  MIGRAINES Duration: significantly worse over the past few weeks Onset: sudden Severity: severe Quality: sharp and throbbing Frequency: intermittent Location: global Headache duration: hours Radiation: no Time of day headache occurs: at random Alleviating factors: fioricet Aggravating factors: reading Headache status at time of visit: current headache Treatments attempted: Treatments attempted: APAP, ibuprofen, aleve", excedrine and triptans, B2 and magnesium, topamax (helped), amitriptyline (weight gain)   Aura: yes Nausea:  yes Vomiting: no Photophobia:   yes Phonophobia:  yes Effect on social functioning:  yes Confusion:  yes Gait disturbance/ataxia:  no Behavioral changes:  no Fevers:  no  Relevant past medical, surgical, family and social history reviewed and updated as indicated. Interim medical history since our last visit reviewed. Allergies and medications reviewed and updated.  Review of Systems  Constitutional: Negative.   Respiratory: Negative.   Cardiovascular: Negative.   Musculoskeletal: Positive for back pain, myalgias, neck pain and neck stiffness. Negative for arthralgias, gait problem and joint swelling.  Neurological: Positive for headaches. Negative for dizziness, tremors, seizures, syncope, facial asymmetry, speech difficulty, weakness, light-headedness and numbness.  Psychiatric/Behavioral: Negative.     Per HPI unless specifically indicated above     Objective:    BP 115/79 (BP Location: Left Arm, Patient Position: Sitting, Cuff Size: Normal)   Pulse 99   Temp 99 F (37.2 C) (Oral)   Resp 17   Ht 5\' 4"  (1.626 m)   Wt 138 lb (62.6 kg)   SpO2 100%   BMI 23.69 kg/m   Wt Readings from Last 3 Encounters:  04/08/16 138 lb (62.6 kg)  12/12/15 141 lb 8 oz (64.2 kg)  10/18/15 141 lb (64 kg)    Physical Exam  Constitutional: She is oriented to person, place, and time. She appears well-developed and well-nourished. No distress.  HENT:  Head: Normocephalic and atraumatic.  Right Ear: Hearing normal.  Left Ear: Hearing normal.  Nose: Nose normal.  Eyes: Conjunctivae and lids are normal. Right eye exhibits no discharge. Left eye exhibits no discharge. No scleral icterus.  Cardiovascular: Normal  rate, regular rhythm, normal heart sounds and intact distal pulses.  Exam reveals no gallop and no friction rub.   No murmur heard. Pulmonary/Chest: Effort normal and breath sounds normal. No respiratory distress. She has no wheezes. She has no rales. She exhibits no tenderness.  Musculoskeletal: Normal range of  motion.  Neurological: She is alert and oriented to person, place, and time.  Skin: Skin is warm, dry and intact. No rash noted. No erythema. No pallor.  Large bruise R hip with hematoma, healing  Psychiatric: She has a normal mood and affect. Her speech is normal and behavior is normal. Judgment and thought content normal. Cognition and memory are normal.  Nursing note and vitals reviewed.   Results for orders placed or performed in visit on 08/16/15  Prolactin  Result Value Ref Range   Prolactin 26.8 (H) 4.8 - 23.3 ng/mL  Comprehensive metabolic panel  Result Value Ref Range   Glucose 80 65 - 99 mg/dL   BUN 13 6 - 20 mg/dL   Creatinine, Ser 0.64 0.57 - 1.00 mg/dL   GFR calc non Af Amer 116 >59 mL/min/1.73   GFR calc Af Amer 134 >59 mL/min/1.73   BUN/Creatinine Ratio 20 9 - 23   Sodium 141 134 - 144 mmol/L   Potassium 3.9 3.5 - 5.2 mmol/L   Chloride 99 96 - 106 mmol/L   CO2 23 18 - 29 mmol/L   Calcium 9.3 8.7 - 10.2 mg/dL   Total Protein 6.9 6.0 - 8.5 g/dL   Albumin 4.4 3.5 - 5.5 g/dL   Globulin, Total 2.5 1.5 - 4.5 g/dL   Albumin/Globulin Ratio 1.8 1.2 - 2.2   Bilirubin Total <0.2 0.0 - 1.2 mg/dL   Alkaline Phosphatase 103 39 - 117 IU/L   AST 16 0 - 40 IU/L   ALT 36 (H) 0 - 32 IU/L  TSH  Result Value Ref Range   TSH 2.870 0.450 - 4.500 uIU/mL  B12 and Folate Panel  Result Value Ref Range   Vitamin B-12 563 211 - 946 pg/mL   Folate 7.6 >3.0 ng/mL      Assessment & Plan:   Problem List Items Addressed This Visit      Cardiovascular and Mediastinum   Migraine headache with aura    Will start her on topamax and fioricet for break through. Recheck 4-6 weeks to see how she's doing.       Relevant Medications   Butalbital-APAP-Caffeine (FIORICET) 50-300-40 MG CAPS   topiramate (TOPAMAX) 50 MG tablet     Other   Chronic low back pain - Primary    PMP reviewed and appropriate. Seeing psychiatry for her other meds. Controlled substance agreement signed previously.  Seems to be stable using all 40 pills in 1 month. Will refill and continue her on the 28 day program. Follow up 3 months on back pain.      Relevant Medications   Butalbital-APAP-Caffeine (FIORICET) 50-300-40 MG CAPS    Other Visit Diagnoses    Bruise       Resolving. Reassured patient.        Follow up plan: Return 4-6 weeks, for follow up migraine .

## 2016-04-08 NOTE — Assessment & Plan Note (Signed)
Will start her on topamax and fioricet for break through. Recheck 4-6 weeks to see how she's doing.

## 2016-05-06 ENCOUNTER — Other Ambulatory Visit: Payer: Self-pay | Admitting: Family Medicine

## 2016-05-06 MED ORDER — HYDROCODONE-ACETAMINOPHEN 10-325 MG PO TABS: ORAL_TABLET | ORAL | 0 refills | Status: DC

## 2016-05-13 ENCOUNTER — Other Ambulatory Visit: Payer: Self-pay | Admitting: Family Medicine

## 2016-05-13 MED ORDER — HYDROCODONE-ACETAMINOPHEN 10-325 MG PO TABS: ORAL_TABLET | ORAL | 0 refills | Status: DC

## 2016-05-28 ENCOUNTER — Ambulatory Visit: Payer: Medicaid Other | Admitting: Family Medicine

## 2016-06-02 ENCOUNTER — Ambulatory Visit: Payer: Medicaid Other | Admitting: Family Medicine

## 2016-06-11 ENCOUNTER — Other Ambulatory Visit: Payer: Self-pay | Admitting: Family Medicine

## 2016-06-11 DIAGNOSIS — Z79899 Other long term (current) drug therapy: Secondary | ICD-10-CM

## 2016-06-11 MED ORDER — HYDROCODONE-ACETAMINOPHEN 10-325 MG PO TABS: ORAL_TABLET | ORAL | 0 refills | Status: DC

## 2016-06-24 ENCOUNTER — Telehealth: Payer: Self-pay | Admitting: Family Medicine

## 2016-06-25 NOTE — Telephone Encounter (Signed)
Please let Heather King know that we should be able to do this, but I will need to get her records from her psychiatrist to review and see her before I can start writing her adderall.

## 2016-06-25 NOTE — Telephone Encounter (Signed)
Pt notified and she stated that she will be stopping by the office to fill out records release.

## 2016-06-25 NOTE — Telephone Encounter (Signed)
Called and left patient a VM asking for her to please return my call. Will need to sign release to be able to get records from psychiatrist.

## 2016-07-04 ENCOUNTER — Other Ambulatory Visit: Payer: Medicaid Other

## 2016-07-04 DIAGNOSIS — Z79899 Other long term (current) drug therapy: Secondary | ICD-10-CM

## 2016-07-15 ENCOUNTER — Other Ambulatory Visit: Payer: Self-pay | Admitting: Family Medicine

## 2016-07-15 MED ORDER — HYDROCODONE-ACETAMINOPHEN 10-325 MG PO TABS: ORAL_TABLET | ORAL | 0 refills | Status: DC

## 2016-07-18 LAB — DRUG SCREEN 764883 11+OXYCO+ALC+CRT-BUND
BARBITURATE: NEGATIVE ng/mL
CREATININE: 41.6 mg/dL (ref 20.0–300.0)
Cannabinoid Quant, Ur: NEGATIVE ng/mL
Ethanol: NEGATIVE %
MEPERIDINE: NEGATIVE ng/mL
METHADONE SCREEN, URINE: NEGATIVE ng/mL
OPIATE SCREEN URINE: NEGATIVE ng/mL
OXYCODONE+OXYMORPHONE UR QL SCN: NEGATIVE ng/mL
PROPOXYPHENE: NEGATIVE ng/mL
Phencyclidine: NEGATIVE ng/mL
Tramadol: NEGATIVE ng/mL
pH, Urine: 6.6 (ref 4.5–8.9)

## 2016-07-18 LAB — BENZODIAZEPINES CONFIRM, URINE
Alprazolam: NEGATIVE
BENZODIAZEPINES: POSITIVE ng/mL — AB
Clonazepam: NEGATIVE
Flurazepam: NEGATIVE
LORAZEPAM: NEGATIVE
Midazolam: NEGATIVE
NORDIAZEPAM: NEGATIVE
OXAZEPAM CONFIRM: 218 ng/mL
OXAZEPAM UR: POSITIVE — AB
Temazepam: NEGATIVE
Triazolam: NEGATIVE

## 2016-07-18 LAB — COCAINE CONF, UR
BENZOYLECGONINE CONF, UR: 59400 ng/mL
COCAINE METAB QUANT UR: POSITIVE — AB

## 2016-07-18 LAB — DRUG PROFILE 799016
Amphetamine GC/MS Conf: 4040 ng/mL
Amphetamine: POSITIVE — AB
Amphetamines: POSITIVE — AB
Methamphetamine: NEGATIVE

## 2016-07-25 ENCOUNTER — Telehealth: Payer: Self-pay | Admitting: Family Medicine

## 2016-07-25 DIAGNOSIS — Z79899 Other long term (current) drug therapy: Secondary | ICD-10-CM

## 2016-07-25 NOTE — Telephone Encounter (Signed)
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 She fervently denies any drug use. She does not that she was at the beach the week before. We will check another urine, if clean, will be able to continue her controlled substances. If + for anything that shouldn't be there, we will have to stop prescribing. She is aware. Will come in on Monday for lab.

## 2016-08-06 ENCOUNTER — Other Ambulatory Visit: Payer: Self-pay | Admitting: Family Medicine

## 2016-08-06 ENCOUNTER — Other Ambulatory Visit: Payer: Medicaid Other

## 2016-08-06 DIAGNOSIS — Z79899 Other long term (current) drug therapy: Secondary | ICD-10-CM

## 2016-08-08 ENCOUNTER — Telehealth: Payer: Self-pay | Admitting: Family Medicine

## 2016-08-08 NOTE — Telephone Encounter (Signed)
Called patient back to let her know that all the results were not ready.

## 2016-08-08 NOTE — Telephone Encounter (Signed)
Not all lab results have been resulted.

## 2016-08-08 NOTE — Telephone Encounter (Signed)
Patient had urine tested in order to pick up her script and called to see if those results were back and if so could she pick up her script. Please advise.   Thank you  [phone]

## 2016-08-09 LAB — DRUG SCREEN 764883 11+OXYCO+ALC+CRT-BUND
BENZODIAZ UR QL: NEGATIVE ng/mL
Barbiturate: NEGATIVE ng/mL
COCAINE (METABOLITE): NEGATIVE ng/mL
CREATININE: 107.6 mg/dL (ref 20.0–300.0)
Cannabinoid Quant, Ur: NEGATIVE ng/mL
Ethanol: NEGATIVE %
MEPERIDINE: NEGATIVE ng/mL
METHADONE SCREEN, URINE: NEGATIVE ng/mL
OPIATE SCREEN URINE: NEGATIVE ng/mL
Oxycodone/Oxymorphone, Urine: NEGATIVE ng/mL
PHENCYCLIDINE: NEGATIVE ng/mL
Propoxyphene: NEGATIVE ng/mL
TRAMADOL: NEGATIVE ng/mL
pH, Urine: 6.7 (ref 4.5–8.9)

## 2016-08-09 LAB — DRUG PROFILE 799016
AMPHETAMINES, URINE: POSITIVE — AB
Amphetamine GC/MS Conf: 3360 ng/mL
Amphetamine: POSITIVE — AB
Methamphetamine: NEGATIVE

## 2016-08-11 NOTE — Telephone Encounter (Signed)
Urine as expected (gets ADD meds from psychiatry). OK for her to pick up Rx-- will recheck in next few months at random.

## 2016-08-11 NOTE — Telephone Encounter (Signed)
Noted. Will forward to front desk staff.

## 2016-08-11 NOTE — Telephone Encounter (Signed)
Please see Urine lab results and advise. Was abnormal again.

## 2016-08-12 NOTE — Telephone Encounter (Signed)
Patient will pick up script today 

## 2016-09-02 ENCOUNTER — Telehealth: Payer: Self-pay | Admitting: Family Medicine

## 2016-09-02 NOTE — Telephone Encounter (Signed)
Tanya dropped off a used packed of "carisoprodol tablets IP" that she got from a [employer]. Seh was wondering if there could have been something in this that could have triggered her drug screen.   There shouldn't be.

## 2016-09-04 ENCOUNTER — Other Ambulatory Visit: Payer: Self-pay | Admitting: Family Medicine

## 2016-09-04 MED ORDER — HYDROCODONE-ACETAMINOPHEN 10-325 MG PO TABS: ORAL_TABLET | ORAL | 0 refills | Status: DC

## 2016-09-11 ENCOUNTER — Other Ambulatory Visit: Payer: Self-pay | Admitting: Family Medicine

## 2016-09-11 ENCOUNTER — Ambulatory Visit: Payer: Medicaid Other | Admitting: Family Medicine

## 2016-09-11 MED ORDER — HYDROCODONE-ACETAMINOPHEN 10-325 MG PO TABS
ORAL_TABLET | ORAL | 0 refills | Status: DC
Start: 2016-09-11 — End: 2016-10-14

## 2016-09-29 ENCOUNTER — Ambulatory Visit: Payer: Medicaid Other | Admitting: Family Medicine

## 2016-09-30 ENCOUNTER — Ambulatory Visit (INDEPENDENT_AMBULATORY_CARE_PROVIDER_SITE_OTHER): Payer: Self-pay | Admitting: Family Medicine

## 2016-09-30 ENCOUNTER — Encounter: Payer: Self-pay | Admitting: Family Medicine

## 2016-09-30 VITALS — BP 121/72 | HR 89 | Temp 99.0°F | Wt 148.1 lb

## 2016-09-30 DIAGNOSIS — M545 Low back pain, unspecified: Secondary | ICD-10-CM

## 2016-09-30 DIAGNOSIS — F909 Attention-deficit hyperactivity disorder, unspecified type: Secondary | ICD-10-CM

## 2016-09-30 DIAGNOSIS — J029 Acute pharyngitis, unspecified: Secondary | ICD-10-CM

## 2016-09-30 DIAGNOSIS — F411 Generalized anxiety disorder: Secondary | ICD-10-CM

## 2016-09-30 DIAGNOSIS — G8929 Other chronic pain: Secondary | ICD-10-CM

## 2016-09-30 MED ORDER — PREDNISONE 50 MG PO TABS: 50.0000 mg | ORAL_TABLET | Freq: Every day | ORAL | 0 refills | Status: DC

## 2016-09-30 MED ORDER — VENLAFAXINE HCL ER 150 MG PO CP24: 300.0000 mg | ORAL_CAPSULE | Freq: Every day | ORAL | 3 refills | Status: DC

## 2016-09-30 NOTE — Progress Notes (Signed)
BP 121/72 (BP Location: Left Arm, Patient Position: Sitting, Cuff Size: Normal)   Pulse 89   Temp 99 F (37.2 C)   Wt 148 lb 1 oz (67.2 kg)   SpO2 100%   BMI 25.41 kg/m    Subjective:    Patient ID: Heather King, female    DOB: [birth year], 36 y.o.   MRN: [id number]  HPI: Heather King is a 37 y.o. female  Chief Complaint  Patient presents with  . ADHD  . Anxiety  . Depression   CHRONIC PAIN- medicine is helping. Has been stable on dose, does not need more Present dose: 15Morphine equivalents/day Pain control status: controlled Duration: chronic Location: low back Quality: dull and aching Current Pain Level: 5/10 Previous Pain Level: 10/10 Breakthrough pain:yes Benefit from narcotic medications:yes What Activities task can be accomplished with current medication?: able to do things, take care of her life Interested in weaning off narcotics:no Stool softners/OTC fiber: no Previous pain specialty evaluation: yes Non-narcotic analgesic meds: yes- did not tolerate them Narcotic contract: yes  ANXIETY/DEPRESSION- has been off her venlafaxine for 2 days, and has been off her ADD medicine for about 2 months.  Duration: chronic Anxious mood: yes  Excessive worrying: yes Irritability: yes  Sweating: yes Nausea: no Palpitations:no Hyperventilation: no Panic attacks: yes Agoraphobia: no  Obscessions/compulsions: no Depressed mood: yes Depression screen [facility] 2/9 09/30/2016 08/16/2015  Decreased Interest 3 1  Down, Depressed, Hopeless 3 2  PHQ - 2 Score 6 3  Altered sleeping 3 3  Tired, decreased energy 3 2  Change in appetite 2 3  Feeling bad or failure about yourself  1 2  Trouble concentrating 2 3  Moving slowly or fidgety/restless 2 1  Suicidal thoughts 0 0  PHQ-9 Score 19 17  Difficult doing work/chores - Very difficult   Anhedonia: no Weight changes: yes Insomnia: yes hard to fall asleep  Hypersomnia: no Fatigue/loss of energy: yes Feelings of  worthlessness: yes Feelings of guilt: yes Impaired concentration/indecisiveness: yes Suicidal ideations: no  Crying spells: yes Recent Stressors/Life Changes: no  Relevant past medical, surgical, family and social history reviewed and updated as indicated. Interim medical history since our last visit reviewed. Allergies and medications reviewed and updated.  Review of Systems  Constitutional: Negative.   HENT: Positive for sore throat and trouble swallowing. Negative for congestion, dental problem, drooling, ear discharge, ear pain, facial swelling, hearing loss, mouth sores, nosebleeds, postnasal drip, rhinorrhea, sinus pain, sinus pressure, sneezing, tinnitus and voice change.   Respiratory: Negative.   Cardiovascular: Negative.   Psychiatric/Behavioral: Positive for confusion, dysphoric mood and sleep disturbance. Negative for agitation, behavioral problems, decreased concentration, hallucinations, self-injury and suicidal ideas. The patient is nervous/anxious. The patient is not hyperactive.     Per HPI unless specifically indicated above     Objective:    BP 121/72 (BP Location: Left Arm, Patient Position: Sitting, Cuff Size: Normal)   Pulse 89   Temp 99 F (37.2 C)   Wt 148 lb 1 oz (67.2 kg)   SpO2 100%   BMI 25.41 kg/m   Wt Readings from Last 3 Encounters:  09/30/16 148 lb 1 oz (67.2 kg)  04/08/16 138 lb (62.6 kg)  12/12/15 141 lb 8 oz (64.2 kg)    Physical Exam  Constitutional: She is oriented to person, place, and time. She appears well-developed and well-nourished. No distress.  HENT:  Head: Normocephalic and atraumatic.  Right Ear: Hearing and external ear normal.  Left Ear:  Hearing and external ear normal.  Nose: Nose normal.  Mouth/Throat: No oropharyngeal exudate.  Swollen tonsils  Eyes: Pupils are equal, round, and reactive to light. Conjunctivae, EOM and lids are normal. Right eye exhibits no discharge. Left eye exhibits no discharge. No scleral icterus.    Neck: Normal range of motion. Neck supple. No JVD present. No tracheal deviation present. No thyromegaly present.  Cardiovascular: Normal rate, regular rhythm, normal heart sounds and intact distal pulses.  Exam reveals no gallop and no friction rub.   No murmur heard. Pulmonary/Chest: Effort normal and breath sounds normal. No stridor. No respiratory distress. She has no wheezes. She has no rales. She exhibits no tenderness.  Musculoskeletal: Normal range of motion.  Lymphadenopathy:    She has cervical adenopathy.  Neurological: She is alert and oriented to person, place, and time.  Skin: Skin is warm, dry and intact. No rash noted. She is not diaphoretic. No erythema. No pallor.  Psychiatric: Her speech is normal and behavior is normal. Judgment and thought content normal. Her mood appears anxious. Cognition and memory are normal.    Results for orders placed or performed in visit on 08/06/16  [id number] 11+Oxyco+Alc+Crt-Bund  Result Value Ref Range   Ethanol Negative Cutoff=0.020 %   Amphetamines, Urine See Final Results Cutoff=1000 ng/mL   Barbiturate Negative Cutoff=200 ng/mL   BENZODIAZ UR QL Negative Cutoff=200 ng/mL   Cannabinoid Quant, Ur Negative Cutoff=50 ng/mL   Cocaine (Metabolite) Negative Cutoff=300 ng/mL   OPIATE SCREEN URINE Negative Cutoff=300 ng/mL   OXYCODONE+OXYMORPHONE UR QL SCN Negative Cutoff=300 ng/mL   Phencyclidine Negative Cutoff=25 ng/mL   Methadone Screen, Urine Negative Cutoff=300 ng/mL   Propoxyphene Negative Cutoff=300 ng/mL   Meperidine Negative Cutoff=200 ng/mL   Tramadol Negative Cutoff=200 ng/mL   Creatinine 107.6 20.0 - 300.0 mg/dL   PH OF URINE 6.7 4.5 - 8.9  Drug Profile [id number]  Result Value Ref Range   Amphetamines Positive (A) Cutoff=1000   Amphetamine Positive (A)    Amphetamine GC/MS Conf 3,360 Cutoff=500 ng/mL   Methamphetamine Negative Cutoff=500      Assessment & Plan:   Problem List Items Addressed This Visit      Other    Anxiety disorder    Refill of her venlafaxine given today. Will need records from psychiatry prior to continuing any controlled substances.       Chronic low back pain - Primary    PMP reviewed and appropriate. Seeing psychiatry for her other meds. Controlled substance agreement signed previously. Seems to be stable using all 40 pills in 1 month. Will refill and continue her on the 28 day program. Follow up 3 months on back pain.      Relevant Medications   predniSONE (DELTASONE) 50 MG tablet   ADD (attention deficit disorder)    Discussed that we will need records from her psychiatrist prior to continuing her medication.        Other Visit Diagnoses    Pharyngitis, unspecified etiology       Likely viral. Pretty swollen. Will treat with prednisone. Let us know if not getting better or getting worse.       Follow up plan: Return in about 3 months (around 12/31/2016) for follow up pain.

## 2016-09-30 NOTE — Assessment & Plan Note (Signed)
PMP reviewed and appropriate. Seeing psychiatry for her other meds. Controlled substance agreement signed previously. Seems to be stable using all 40 pills in 1 month. Will refill and continue her on the 28 day program. Follow up 3 months on back pain.

## 2016-09-30 NOTE — Assessment & Plan Note (Signed)
Discussed that we will need records from her psychiatrist prior to continuing her medication.

## 2016-09-30 NOTE — Assessment & Plan Note (Signed)
Refill of her venlafaxine given today. Will need records from psychiatry prior to continuing any controlled substances.

## 2016-10-14 ENCOUNTER — Other Ambulatory Visit: Payer: Self-pay | Admitting: Family Medicine

## 2016-10-14 MED ORDER — HYDROCODONE-ACETAMINOPHEN 10-325 MG PO TABS: ORAL_TABLET | ORAL | 0 refills | Status: DC

## 2016-10-20 ENCOUNTER — Telehealth: Payer: Self-pay | Admitting: Family Medicine

## 2016-10-20 NOTE — Telephone Encounter (Signed)
Needing to check if records from psychiatry at [facility] have came in so she can make an appointment to see provider for medication.   Please Advise.  Thank you

## 2016-10-20 NOTE — Telephone Encounter (Signed)
Routing to provider  

## 2016-10-20 NOTE — Telephone Encounter (Signed)
I have notes from [facility] in 2017, but nothing from [place].

## 2016-10-21 NOTE — Telephone Encounter (Signed)
Called and left a message for patient to return my call.  

## 2016-10-21 NOTE — Telephone Encounter (Signed)
Tiff- can we please check on these records?

## 2016-10-21 NOTE — Telephone Encounter (Signed)
[place] called to give another phone number for [facility] [phone]

## 2016-10-21 NOTE — Telephone Encounter (Signed)
Patient did refax medical release form to [facility].

## 2016-10-27 NOTE — Telephone Encounter (Signed)
Called and left a message asking patient to return my call, so that I can get the name of the [facility] that she went to.

## 2016-10-28 NOTE — Telephone Encounter (Signed)
Patient will go to [facility] and sign a records release.

## 2016-11-03 ENCOUNTER — Telehealth: Payer: Self-pay

## 2016-11-03 ENCOUNTER — Telehealth: Payer: Self-pay | Admitting: Family Medicine

## 2016-11-03 MED ORDER — VENLAFAXINE HCL ER 150 MG PO CP24: 300.0000 mg | ORAL_CAPSULE | Freq: Every day | ORAL | 3 refills | Status: DC

## 2016-11-03 NOTE — Telephone Encounter (Signed)
Patient needing refill on medication for venlafaxine sent to [facility] in [place], [place]. Patient is completely out of medication.   Please Advise.  Thank you

## 2016-11-03 NOTE — Telephone Encounter (Signed)
All set!

## 2016-11-03 NOTE — Telephone Encounter (Signed)
Called and left a message letting patient know that a year supply was sent to [place] in August, and that [employer] and call and get the prescription transferred.

## 2016-11-03 NOTE — Telephone Encounter (Signed)
Please send a prescription to [facility] for Venlafaxine. Patient took the prescription to [place] and Kristopher Oppenheim has to have a actual prescription since she is new there.

## 2016-11-07 ENCOUNTER — Encounter: Payer: Self-pay | Admitting: Family Medicine

## 2016-11-07 DIAGNOSIS — F29 Unspecified psychosis not due to a substance or known physiological condition: Secondary | ICD-10-CM | POA: Insufficient documentation

## 2016-11-10 ENCOUNTER — Ambulatory Visit (INDEPENDENT_AMBULATORY_CARE_PROVIDER_SITE_OTHER): Payer: Self-pay | Admitting: Family Medicine

## 2016-11-10 ENCOUNTER — Encounter: Payer: Self-pay | Admitting: Family Medicine

## 2016-11-10 VITALS — BP 112/75 | HR 79 | Temp 98.7°F | Wt 148.0 lb

## 2016-11-10 DIAGNOSIS — F411 Generalized anxiety disorder: Secondary | ICD-10-CM

## 2016-11-10 DIAGNOSIS — F29 Unspecified psychosis not due to a substance or known physiological condition: Secondary | ICD-10-CM

## 2016-11-10 DIAGNOSIS — F909 Attention-deficit hyperactivity disorder, unspecified type: Secondary | ICD-10-CM

## 2016-11-10 MED ORDER — CLONAZEPAM 0.5 MG PO TABS: 0.5000 mg | ORAL_TABLET | Freq: Two times a day (BID) | ORAL | 0 refills | Status: DC | PRN

## 2016-11-10 MED ORDER — ZOLPIDEM TARTRATE 5 MG PO TABS: 5.0000 mg | ORAL_TABLET | Freq: Every day | ORAL | 3 refills | Status: DC

## 2016-11-10 MED ORDER — AMPHETAMINE-DEXTROAMPHETAMINE 20 MG PO TABS: 20.0000 mg | ORAL_TABLET | Freq: Two times a day (BID) | ORAL | 0 refills | Status: DC

## 2016-11-10 NOTE — Assessment & Plan Note (Addendum)
Was stable on this regimen with psychiatry before she lost her insurance. We will restart medication and recheck in 1 month to see how she's doing. If doing well, will give her a 3 month supply.

## 2016-11-10 NOTE — Assessment & Plan Note (Signed)
Was stable on current regimen. Discussion about taking klonopin with hydrocodone- needs to give at least 4-6 hours between taking them, never take them together. She is aware. Continue current regimen. Continue to monitor. Discussed that we can bridge her until she gets insurance again, but then it would be best for her to see psychiatry given her diagnoses.

## 2016-11-10 NOTE — Progress Notes (Signed)
BP 112/75 (BP Location: Left Arm, Patient Position: Sitting, Cuff Size: Normal)   Pulse 79   Temp 98.7 F (37.1 C)   Wt 148 lb (67.1 kg)   SpO2 98%   BMI 25.40 kg/m    Subjective:    Patient ID: Heather King, female    DOB: 1980-09-25, 36 y.o.   MRN: [id number]  HPI: Heather King is a 36 y.o. female  Chief Complaint  Patient presents with  . ADHD   Has lost her insurance and was having a very difficult time getting down to [facility] to get her appointments.   ANXIETY/STRESS- seemed to be working pretty well on that dose. No hallucinations. Has been off medicine for a little bit, and it's been a bit worse Duration:exacerbated Anxious mood: yes  Excessive worrying: yes Irritability: yes  Sweating: no Nausea: no Palpitations:no Hyperventilation: no Panic attacks: no Agoraphobia: no  Obscessions/compulsions: no Depressed mood: yes Depression screen [facility] 2/9 09/30/2016 08/16/2015  Decreased Interest 3 1  Down, Depressed, Hopeless 3 2  PHQ - 2 Score 6 3  Altered sleeping 3 3  Tired, decreased energy 3 2  Change in appetite 2 3  Feeling bad or failure about yourself  1 2  Trouble concentrating 2 3  Moving slowly or fidgety/restless 2 1  Suicidal thoughts 0 0  PHQ-9 Score 19 17  Difficult doing work/chores - Very difficult   Anhedonia: no Weight changes: no Insomnia: yes hard to fall asleep  Hypersomnia: no Fatigue/loss of energy: yes Feelings of worthlessness: no Feelings of guilt: no Impaired concentration/indecisiveness: yes Suicidal ideations: no  Crying spells: no Recent Stressors/Life Changes: yes   Relationship problems: no   Family stress: yes     Financial stress: yes    Job stress: no    Recent death/loss: no  ADHD FOLLOW UP ADHD status: uncontrolled Satisfied with current therapy: when she was on it - she was Medication compliance:  has been off medicine for several months due to not being able to see her psychiatrist Controlled substance contract:  yes Previous psychiatry evaluation: yes Previous medications: yes vyvanse, adderall, ritalin   Taking meds on weekends/vacations: yes Work/school performance:  average Difficulty sustaining attention/completing tasks: yes Distracted by extraneous stimuli: yes Does not listen when spoken to: no  Fidgets with hands or feet: no Unable to stay in seat: no Blurts out/interrupts others: no ADHD Medication Side Effects: no    Decreased appetite: no    Headache: no    Sleeping disturbance pattern: no    Irritability: no    Rebound effects (worse than baseline) off medication: no    Anxiousness: no    Dizziness: no    Tics: no   Relevant past medical, surgical, family and social history reviewed and updated as indicated. Interim medical history since our last visit reviewed. Allergies and medications reviewed and updated.  Review of Systems  Constitutional: Negative.   Respiratory: Negative.   Cardiovascular: Negative.   Psychiatric/Behavioral: Positive for dysphoric mood and sleep disturbance. Negative for agitation, behavioral problems, confusion, decreased concentration, hallucinations, self-injury and suicidal ideas. The patient is nervous/anxious. The patient is not hyperactive.     Per HPI unless specifically indicated above     Objective:    BP 112/75 (BP Location: Left Arm, Patient Position: Sitting, Cuff Size: Normal)   Pulse 79   Temp 98.7 F (37.1 C)   Wt 148 lb (67.1 kg)   SpO2 98%   BMI 25.40 kg/m  Wt Readings from Last 3 Encounters:  11/10/16 148 lb (67.1 kg)  09/30/16 148 lb 1 oz (67.2 kg)  04/08/16 138 lb (62.6 kg)    Physical Exam  Constitutional: She is oriented to person, place, and time. She appears well-developed and well-nourished. No distress.  HENT:  Head: Normocephalic and atraumatic.  Right Ear: Hearing normal.  Left Ear: Hearing normal.  Nose: Nose normal.  Eyes: Conjunctivae and lids are normal. Right eye exhibits no discharge. Left eye  exhibits no discharge. No scleral icterus.  Cardiovascular: Normal rate, regular rhythm, normal heart sounds and intact distal pulses.  Exam reveals no gallop and no friction rub.   No murmur heard. Pulmonary/Chest: Effort normal and breath sounds normal. No respiratory distress. She has no wheezes. She has no rales. She exhibits no tenderness.  Musculoskeletal: Normal range of motion.  Neurological: She is alert and oriented to person, place, and time.  Skin: Skin is warm, dry and intact. No rash noted. She is not diaphoretic. No erythema. No pallor.  Psychiatric: She has a normal mood and affect. Her speech is normal and behavior is normal. Judgment and thought content normal. Cognition and memory are normal.  Nursing note and vitals reviewed.   Results for orders placed or performed in visit on 08/06/16  [id number] 11+Oxyco+Alc+Crt-Bund  Result Value Ref Range   Ethanol Negative Cutoff=0.020 %   Amphetamines, Urine See Final Results Cutoff=1000 ng/mL   Barbiturate Negative Cutoff=200 ng/mL   BENZODIAZ UR QL Negative Cutoff=200 ng/mL   Cannabinoid Quant, Ur Negative Cutoff=50 ng/mL   Cocaine (Metabolite) Negative Cutoff=300 ng/mL   OPIATE SCREEN URINE Negative Cutoff=300 ng/mL   OXYCODONE+OXYMORPHONE UR QL SCN Negative Cutoff=300 ng/mL   Phencyclidine Negative Cutoff=25 ng/mL   Methadone Screen, Urine Negative Cutoff=300 ng/mL   Propoxyphene Negative Cutoff=300 ng/mL   Meperidine Negative Cutoff=200 ng/mL   Tramadol Negative Cutoff=200 ng/mL   Creatinine 107.6 20.0 - 300.0 mg/dL   PH OF URINE 6.7 4.5 - 8.9  Drug Profile [phone]  Result Value Ref Range   Amphetamines Positive (A) Cutoff=1000   Amphetamine Positive (A)    Amphetamine GC/MS Conf 3,360 Cutoff=500 ng/mL   Methamphetamine Negative Cutoff=500      Assessment & Plan:   Problem List Items Addressed This Visit      Other   Anxiety disorder    Was stable on current regimen. Discussion about taking klonopin with  hydrocodone- needs to give at least 4-6 hours between taking them, never take them together. She is aware. Continue current regimen. Continue to monitor. Discussed that we can bridge her until she gets insurance again, but then it would be best for her to see psychiatry given her diagnoses.       ADD (attention deficit disorder) - Primary    Was stable on this regimen with psychiatry before she lost her insurance. We will restart medication and recheck in 1 month to see how she's doing. If doing well, will give her a 3 month supply.       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 Discussion about taking klonopin with hydrocodone- needs to give at least 4-6 hours between taking them, never take them together. She is aware. Continue current regimen. Continue to monitor. Discussed that we can bridge her until she gets insurance again, but then it would be best for her to see psychiatry given her diagnoses.           Follow up plan: Return in about  4 weeks (around 12/08/2016) for follow up ADD and Anxiety.

## 2016-11-17 ENCOUNTER — Other Ambulatory Visit: Payer: Self-pay | Admitting: Family Medicine

## 2016-11-17 MED ORDER — HYDROCODONE-ACETAMINOPHEN 10-325 MG PO TABS: ORAL_TABLET | ORAL | 0 refills | Status: DC

## 2016-12-08 ENCOUNTER — Ambulatory Visit: Payer: Medicaid Other | Admitting: Family Medicine

## 2016-12-08 ENCOUNTER — Other Ambulatory Visit: Payer: Self-pay | Admitting: Family Medicine

## 2016-12-09 NOTE — Telephone Encounter (Signed)
She no showed her appointment yesterday. She will have to wait for her refill.

## 2016-12-09 NOTE — Telephone Encounter (Signed)
This is a controlled substance.

## 2016-12-09 NOTE — Telephone Encounter (Signed)
Patient has an appointment on 12/11/16.

## 2016-12-09 NOTE — Telephone Encounter (Signed)
Patient notified

## 2016-12-11 ENCOUNTER — Encounter: Payer: Self-pay | Admitting: Family Medicine

## 2016-12-11 ENCOUNTER — Ambulatory Visit (INDEPENDENT_AMBULATORY_CARE_PROVIDER_SITE_OTHER): Payer: Self-pay | Admitting: Family Medicine

## 2016-12-11 VITALS — BP 105/70 | HR 86 | Temp 98.5°F | Wt 151.2 lb

## 2016-12-11 DIAGNOSIS — F29 Unspecified psychosis not due to a substance or known physiological condition: Secondary | ICD-10-CM

## 2016-12-11 DIAGNOSIS — R5382 Chronic fatigue, unspecified: Secondary | ICD-10-CM

## 2016-12-11 DIAGNOSIS — F909 Attention-deficit hyperactivity disorder, unspecified type: Secondary | ICD-10-CM

## 2016-12-11 DIAGNOSIS — F411 Generalized anxiety disorder: Secondary | ICD-10-CM

## 2016-12-11 MED ORDER — AMPHETAMINE-DEXTROAMPHETAMINE 20 MG PO TABS: 20.0000 mg | ORAL_TABLET | Freq: Two times a day (BID) | ORAL | 0 refills | Status: DC

## 2016-12-11 MED ORDER — BUPROPION HCL ER (SR) 150 MG PO TB12: ORAL_TABLET | ORAL | 3 refills | Status: DC

## 2016-12-11 MED ORDER — CLONAZEPAM 0.5 MG PO TABS: 0.5000 mg | ORAL_TABLET | Freq: Two times a day (BID) | ORAL | 2 refills | Status: DC | PRN

## 2016-12-11 NOTE — Assessment & Plan Note (Signed)
Stable on current regimen. Continue current regimen. 3 month supply given today.

## 2016-12-11 NOTE — Assessment & Plan Note (Addendum)
Not doing great. Will add wellbutrin and recheck in about a month. Continue current regimen. 3 month supply given today.

## 2016-12-11 NOTE — Patient Instructions (Addendum)
Menopause and Herbal Products What is menopause? Menopause is the normal time of life when menstrual periods decrease in frequency and eventually stop completely. This process can take several years for some women. Menopause is complete when you have had an absence of menstruation for a full year since your last menstrual period. It usually occurs between the ages of 48 and 55. It is not common for menopause to begin before the age of 40. During menopause, your body stops producing the female hormones estrogen and progesterone. Common symptoms associated with this loss of hormones (vasomotor symptoms) are:  Hot flashes.  Hot flushes.  Night sweats.  Other common symptoms and complications of menopause include:  Decrease in sex drive.  Vaginal dryness and thinning of the walls of the vagina. This can make sex painful.  Dryness of the skin and development of wrinkles.  Headaches.  Tiredness.  Irritability.  Memory problems.  Weight gain.  Bladder infections.  Hair growth on the face and chest.  Inability to reproduce offspring (infertility).  Loss of density in the bones (osteoporosis) increasing your risk for breaks (fractures).  Depression.  Hardening and narrowing of the arteries (atherosclerosis). This increases your risk of heart attack and stroke.  What treatment options are available? There are many treatment choices for menopause symptoms. The most common treatment is hormone replacement therapy. Many alternative therapies for menopause are emerging, including the use of herbal products. These supplements can be found in the form of herbs, teas, oils, tinctures, and pills. Common herbal supplements for menopause are made from plants that contain phytoestrogens. Phytoestrogens are compounds that occur naturally in plants and plant products. They act like estrogen in the body. Foods and herbs that contain phytoestrogens include:  Soy.  Flax seeds.  Red  clover.  Ginseng.  What menopause symptoms may be helped if I use herbal products?  Vasomotor symptoms. These may be helped by: ? Soy. Some studies show that soy may have a moderate benefit for hot flashes. ? Black cohosh. There is limited evidence indicating this may be beneficial for hot flashes.  Symptoms that are related to heart and blood vessel disease. These may be helped by soy. Studies have shown that soy can help to lower cholesterol.  Depression. This may be helped by: ? St. John's wort. There is limited evidence that shows this may help mild to moderate depression. ? Black cohosh. There is evidence that this may help depression and mood swings.  Osteoporosis. Soy may help to decrease bone loss that is associated with menopause and may prevent osteoporosis. Limited evidence indicates that red clover may offer some bone loss protection as well. Other herbal products that are commonly used during menopause lack enough evidence to support their use as a replacement for conventional menopause therapies. These products include evening primrose, ginseng, and red clover. What are the cases when herbal products should not be used during menopause? Do not use herbal products during menopause without your health care provider's approval if:  You are taking medicine.  You have a preexisting liver condition.  Are there any risks in my taking herbal products during menopause? If you choose to use herbal products to help with symptoms of menopause, keep in mind that:  Different supplements have different and unmeasured amounts of herbal ingredients.  Herbal products are not regulated the same way that medicines are.  Concentrations of herbs may vary depending on the way they are prepared. For example, the concentration may be different in a pill,   tea, oil, and tincture.  Little is known about the risks of using herbal products, particularly the risks of long-term use.  Some herbal  supplements can be harmful when combined with certain medicines.  Most commonly reported side effects of herbal products are mild. However, if used improperly, many herbal supplements can cause serious problems. Talk to your health care provider before starting any herbal product. If problems develop, stop taking the supplement and let your health care provider know. This information is not intended to replace advice given to you by your health care provider. Make sure you discuss any questions you have with your health care provider. Document Released: 07/16/2007 Document Revised: 12/25/2015 Document Reviewed: 07/12/2013 Elsevier Interactive Patient Education  2017 [employer]  

## 2016-12-11 NOTE — Progress Notes (Signed)
BP 105/70 (BP Location: Left Arm, Patient Position: Sitting, Cuff Size: Normal)   Pulse 86   Temp 98.5 F (36.9 C)   Wt 151 lb 3 oz (68.6 kg)   SpO2 98%   BMI 25.95 kg/m    Subjective:    Patient ID: Heather King, female    DOB: [birth year], 36 y.o.   MRN: [id number]  HPI: Heather King is a 36 y.o. female  Chief Complaint  Patient presents with  . Depression   DEPRESSION Mood status: uncontrolled Satisfied with current treatment?: no Symptom severity: moderate  Duration of current treatment : months Side effects: no Medication compliance: good compliance Psychotherapy/counseling: no  Previous psychiatric medications: effexor, klonopin Depressed mood: yes Anxious mood: yes Anhedonia: no Significant weight loss or gain: yes Insomnia: yes hard to fall asleep Fatigue: yes Feelings of worthlessness or guilt: yes Impaired concentration/indecisiveness: yes Suicidal ideations: no Hopelessness: no Crying spells: no Depression screen [facility] 2/9 12/11/2016 09/30/2016 08/16/2015  Decreased Interest 3 3 1   Down, Depressed, Hopeless 3 3 2   PHQ - 2 Score 6 6 3   Altered sleeping 2 3 3   Tired, decreased energy 3 3 2   Change in appetite 3 2 3   Feeling bad or failure about yourself  1 1 2   Trouble concentrating 2 2 3   Moving slowly or fidgety/restless 2 2 1   Suicidal thoughts 0 0 0  PHQ-9 Score 19 19 17   Difficult doing work/chores - - Very difficult   ADHD FOLLOW UP ADHD status: stable Satisfied with current therapy: yes Medication compliance:  excellent compliance Controlled substance contract: yes Previous psychiatry evaluation: yes Previous medications: yes    Taking meds on weekends/vacations: yes Work/school performance:  good Difficulty sustaining attention/completing tasks: yes Distracted by extraneous stimuli: no Does not listen when spoken to: no  Fidgets with hands or feet: no Unable to stay in seat: no Blurts out/interrupts others: no ADHD Medication Side  Effects: no    Decreased appetite: no    Headache: no    Sleeping disturbance pattern: no    Irritability: no    Rebound effects (worse than baseline) off medication: no    Anxiousness: no    Dizziness: no    Tics: no   Relevant past medical, surgical, family and social history reviewed and updated as indicated. Interim medical history since our last visit reviewed. Allergies and medications reviewed and updated.  Review of Systems  Constitutional: Negative.   Respiratory: Negative.   Cardiovascular: Negative.   Gastrointestinal: Negative.   Psychiatric/Behavioral: Positive for dysphoric mood and sleep disturbance. Negative for agitation, behavioral problems, confusion, decreased concentration, hallucinations, self-injury and suicidal ideas. The patient is nervous/anxious. The patient is not hyperactive.     Per HPI unless specifically indicated above     Objective:    BP 105/70 (BP Location: Left Arm, Patient Position: Sitting, Cuff Size: Normal)   Pulse 86   Temp 98.5 F (36.9 C)   Wt 151 lb 3 oz (68.6 kg)   SpO2 98%   BMI 25.95 kg/m   Wt Readings from Last 3 Encounters:  12/11/16 151 lb 3 oz (68.6 kg)  11/10/16 148 lb (67.1 kg)  09/30/16 148 lb 1 oz (67.2 kg)    Physical Exam  Constitutional: She is oriented to person, place, and time. She appears well-developed and well-nourished. No distress.  HENT:  Head: Normocephalic and atraumatic.  Right Ear: Hearing normal.  Left Ear: Hearing normal.  Nose: Nose normal.  Eyes: Conjunctivae and  lids are normal. Right eye exhibits no discharge. Left eye exhibits no discharge. No scleral icterus.  Cardiovascular: Normal rate, regular rhythm, normal heart sounds and intact distal pulses.  Exam reveals no gallop and no friction rub.   No murmur heard. Pulmonary/Chest: Effort normal and breath sounds normal. No respiratory distress. She has no wheezes. She has no rales. She exhibits no tenderness.  Musculoskeletal: Normal range  of motion.  Neurological: She is alert and oriented to person, place, and time.  Skin: Skin is warm, dry and intact. No rash noted. She is not diaphoretic. No erythema. No pallor.  Psychiatric: She has a normal mood and affect. Her speech is normal and behavior is normal. Judgment and thought content normal. Cognition and memory are normal.  Nursing note and vitals reviewed.   Results for orders placed or performed in visit on 08/06/16  [id number] 11+Oxyco+Alc+Crt-Bund  Result Value Ref Range   Ethanol Negative Cutoff=0.020 %   Amphetamines, Urine See Final Results Cutoff=1000 ng/mL   Barbiturate Negative Cutoff=200 ng/mL   BENZODIAZ UR QL Negative Cutoff=200 ng/mL   Cannabinoid Quant, Ur Negative Cutoff=50 ng/mL   Cocaine (Metabolite) Negative Cutoff=300 ng/mL   OPIATE SCREEN URINE Negative Cutoff=300 ng/mL   OXYCODONE+OXYMORPHONE UR QL SCN Negative Cutoff=300 ng/mL   Phencyclidine Negative Cutoff=25 ng/mL   Methadone Screen, Urine Negative Cutoff=300 ng/mL   Propoxyphene Negative Cutoff=300 ng/mL   Meperidine Negative Cutoff=200 ng/mL   Tramadol Negative Cutoff=200 ng/mL   Creatinine 107.6 20.0 - 300.0 mg/dL   PH OF URINE 6.7 4.5 - 8.9  Drug Profile [phone]  Result Value Ref Range   Amphetamines Positive (A) Cutoff=1000   Amphetamine Positive (A)    Amphetamine GC/MS Conf 3,360 Cutoff=500 ng/mL   Methamphetamine Negative Cutoff=500      Assessment & Plan:   Problem List Items Addressed This Visit      Other   Anxiety disorder    Not doing great. Will add wellbutrin and recheck in about a month. Continue current regimen. 3 month supply given today.       Relevant Medications   buPROPion (WELLBUTRIN SR) 150 MG 12 hr tablet   ADD (attention deficit disorder)    Stable on current regimen. Continue current regimen. 3 month supply given today.       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 Continue current regimen. 3 month supply given today.        Other  Visit Diagnoses    Chronic fatigue    -  Primary   Will check blood work today to see if there is an organic cause. Await results. Call with any concerns.    Relevant Orders   CBC with Differential/Platelet   Comprehensive metabolic panel   TSH       Follow up plan: Return in about 3 months (around 03/13/2017) for Follow up mood and ADD and pain.

## 2016-12-12 ENCOUNTER — Encounter: Payer: Self-pay | Admitting: Family Medicine

## 2016-12-12 LAB — COMPREHENSIVE METABOLIC PANEL
ALBUMIN: 4.5 g/dL (ref 3.5–5.5)
ALK PHOS: 82 IU/L (ref 39–117)
ALT: 12 IU/L (ref 0–32)
AST: 16 IU/L (ref 0–40)
Albumin/Globulin Ratio: 2 (ref 1.2–2.2)
BUN / CREAT RATIO: 14 (ref 9–23)
BUN: 13 mg/dL (ref 6–20)
Bilirubin Total: 0.4 mg/dL (ref 0.0–1.2)
CALCIUM: 9.4 mg/dL (ref 8.7–10.2)
CO2: 26 mmol/L (ref 20–29)
CREATININE: 0.95 mg/dL (ref 0.57–1.00)
Chloride: 101 mmol/L (ref 96–106)
GFR calc Af Amer: 89 mL/min/{1.73_m2} (ref >59)
GFR calc non Af Amer: 77 mL/min/{1.73_m2} (ref >59)
Globulin, Total: 2.3 g/dL (ref 1.5–4.5)
Glucose: 80 mg/dL (ref 65–99)
Potassium: 4.5 mmol/L (ref 3.5–5.2)
Sodium: 140 mmol/L (ref 134–144)
Total Protein: 6.8 g/dL (ref 6.0–8.5)

## 2016-12-12 LAB — CBC WITH DIFFERENTIAL/PLATELET
BASOS ABS: 0 10*3/uL (ref 0.0–0.2)
Basos: 0 %
EOS (ABSOLUTE): 0.1 10*3/uL (ref 0.0–0.4)
EOS: 1 %
HEMATOCRIT: 37.7 % (ref 34.0–46.6)
HEMOGLOBIN: 12.3 g/dL (ref 11.1–15.9)
IMMATURE GRANULOCYTES: 0 %
Immature Grans (Abs): 0 10*3/uL (ref 0.0–0.1)
LYMPHS: 23 %
Lymphocytes Absolute: 1.9 10*3/uL (ref 0.7–3.1)
MCH: 31.3 pg (ref 26.6–33.0)
MCHC: 32.6 g/dL (ref 31.5–35.7)
MCV: 96 fL (ref 79–97)
MONOCYTES: 6 %
Monocytes Absolute: 0.5 10*3/uL (ref 0.1–0.9)
NEUTROS PCT: 70 %
Neutrophils Absolute: 5.8 10*3/uL (ref 1.4–7.0)
Platelets: 309 10*3/uL (ref 150–379)
RBC: 3.93 x10E6/uL (ref 3.77–5.28)
RDW: 13.1 % (ref 12.3–15.4)
WBC: 8.3 10*3/uL (ref 3.4–10.8)

## 2016-12-12 LAB — TSH: TSH: 3.47 u[IU]/mL (ref 0.450–4.500)

## 2016-12-15 ENCOUNTER — Other Ambulatory Visit: Payer: Self-pay | Admitting: Family Medicine

## 2016-12-15 MED ORDER — HYDROCODONE-ACETAMINOPHEN 10-325 MG PO TABS: ORAL_TABLET | ORAL | 0 refills | Status: DC

## 2016-12-16 ENCOUNTER — Telehealth: Payer: Self-pay | Admitting: Family Medicine

## 2016-12-16 NOTE — Telephone Encounter (Signed)
Copied from [place] [id number]. Topic: Quick Communication - See Telephone Encounter >> Dec 16, 2016 10:51 AM Bea Graff, [place] wrote: CRM for notification. See Telephone encounter for:  Patient calling wanting the results of her labs that she had drawn 12/11/16. Please give pt a call  12/16/16.

## 2016-12-16 NOTE — Telephone Encounter (Signed)
Letter was generated and mailed out to patient. Tried calling patient to let her know what Dr. Wynetta Emery on her letter but patient did not answer so I left a VM asking for the patient to please return my call.   Dr. Durenda Age message on the lab letter reads "[name], everything looks good in terms of your blood work. Let's see what happens with the new medicine."   OK for [facility] to give this information to the patient when she returns the call.

## 2017-01-05 ENCOUNTER — Telehealth: Payer: Self-pay | Admitting: Family Medicine

## 2017-01-05 ENCOUNTER — Encounter: Payer: Self-pay | Admitting: Family Medicine

## 2017-01-05 NOTE — Telephone Encounter (Signed)
Copied from [place] [id number]. Topic: Quick Communication - See Telephone Encounter >> Jan 05, 2017  1:00 PM Cleaster Corin, [place] wrote: CRM for notification. See Telephone encounter for:   01/05/17. Pt. Called she has a disability meeting with her lawyer on Thursday 01/08/17 and would like to see if Dr. Wynetta Emery could write a letter stating her medical issues (bi-polar,depression,anxiety, and huntingtion's disease)

## 2017-01-05 NOTE — Telephone Encounter (Signed)
Letter for Heather King up front for her to pick up.

## 2017-01-06 IMAGING — MG MM DIGITAL DIAGNOSTIC BILAT W/ TOMO W/ CAD
8 of 17 series · 8 of 40 positions shown · non-contrast
Comparison: None.

CLINICAL DATA: Patient describes bilateral spontaneous nipple
discharge, intermittent over the past 6 months. Patient describes
the discharge as whitish in color.

Patient also describes bilateral breast pain, predominantly
periareolar.
This is patient's baseline mammogram.
BI-RADS CATEGORY  2D DIGITAL DIAGNOSTIC BILATERAL MAMMOGRAM WITH CAD
AND ADJUNCT TOMO

[L MLO synth-2D (1 of 2)]
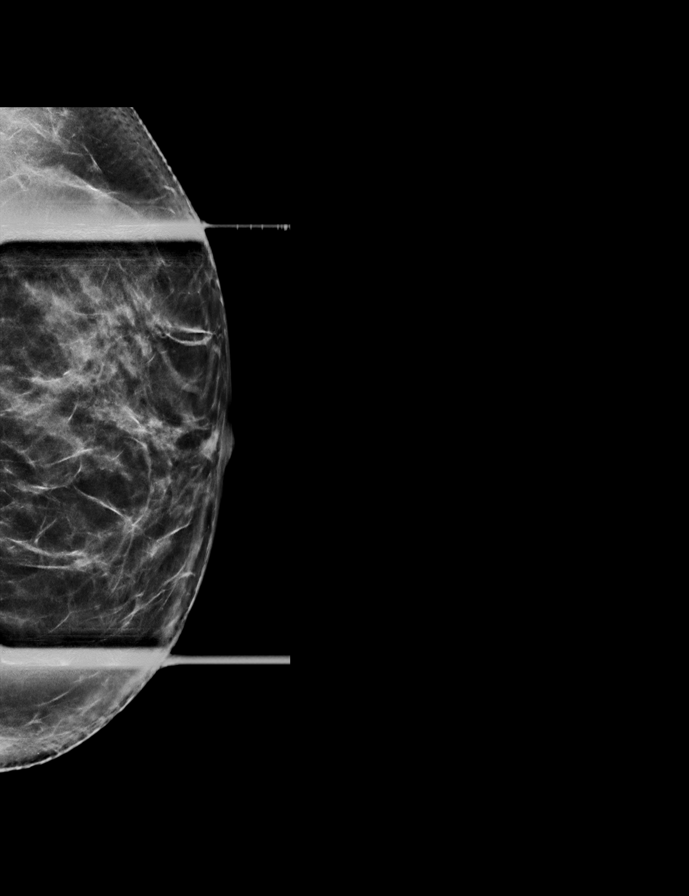

[R MLO (1 of 2)]
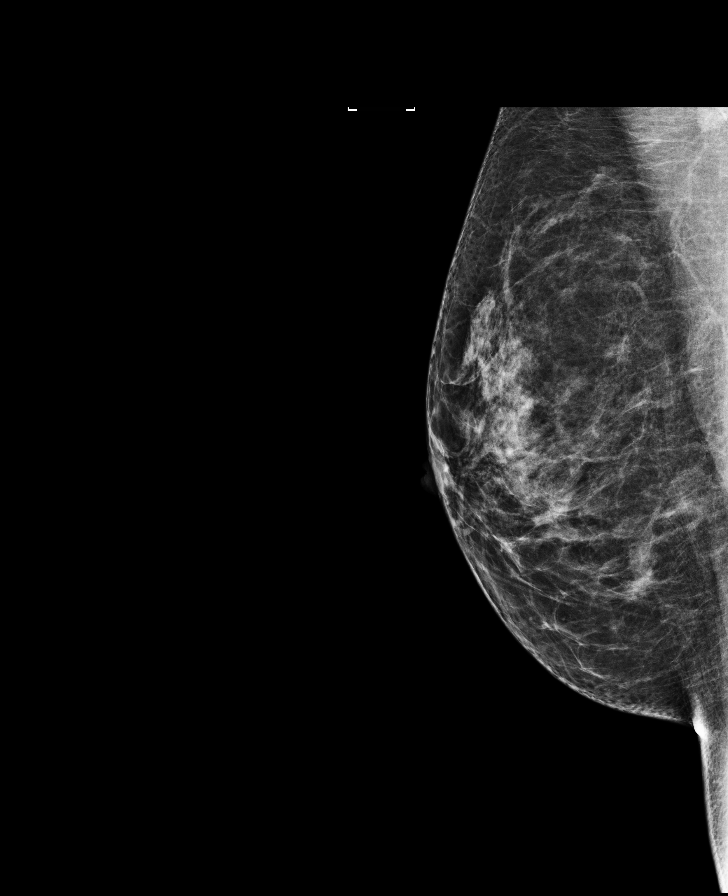

[R MLO (2 of 2)]
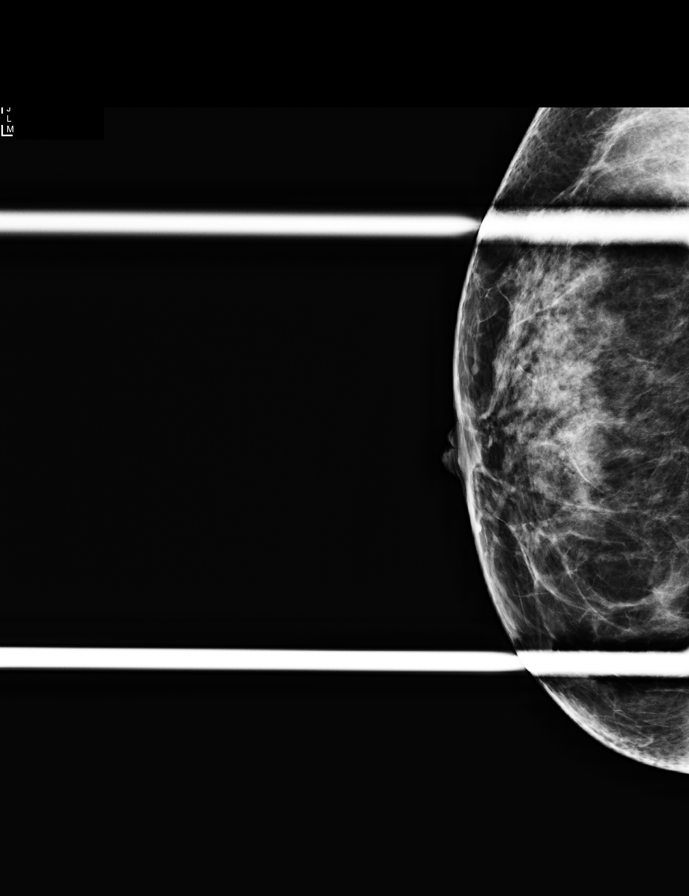

[R MLO synth-2D (1 of 2)]
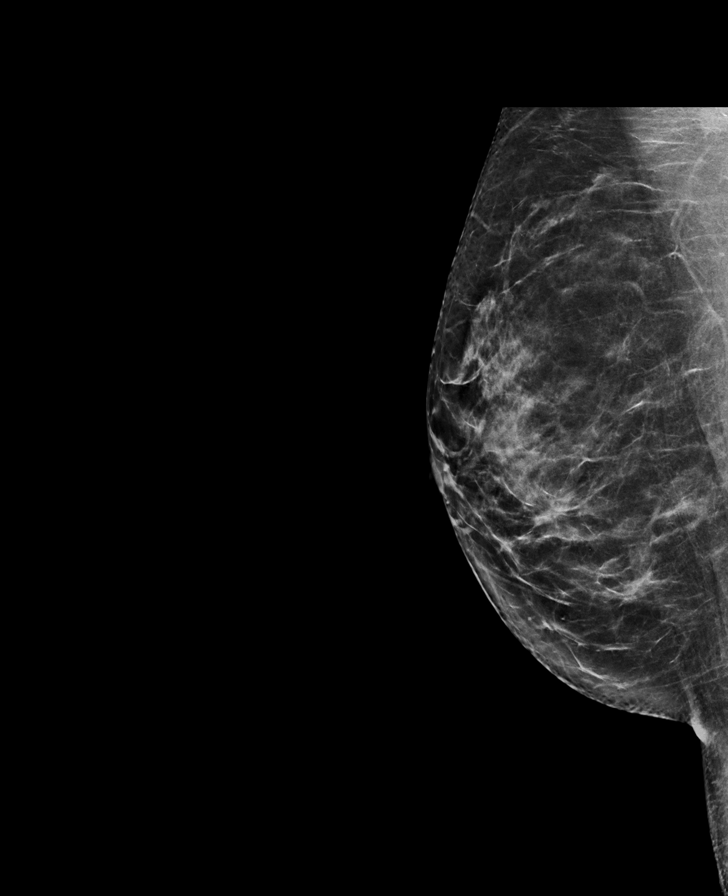

[L MLO (1 of 2)]
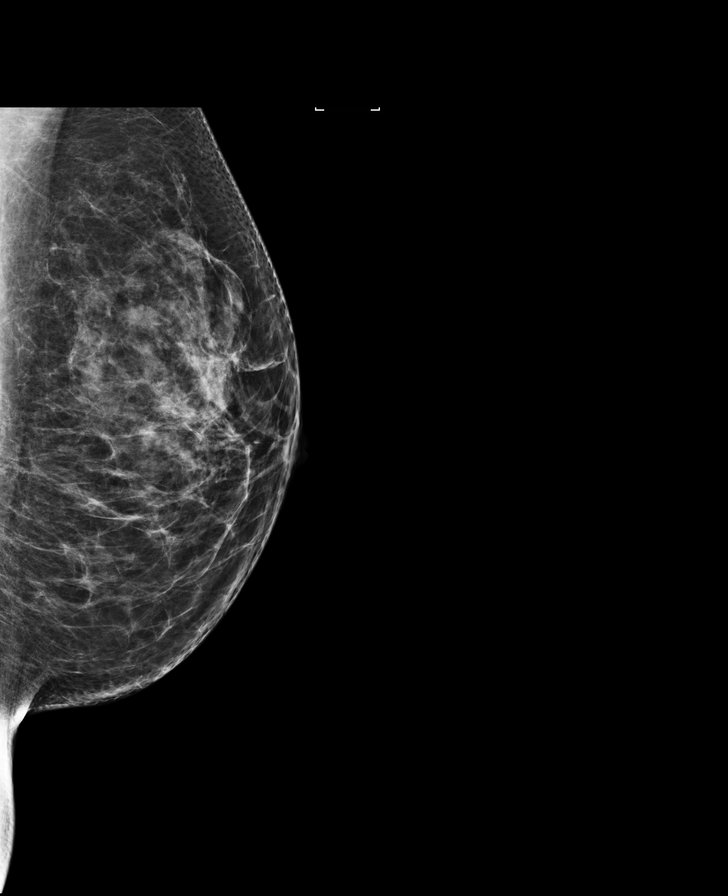

[R MLO synth-2D (2 of 2)]
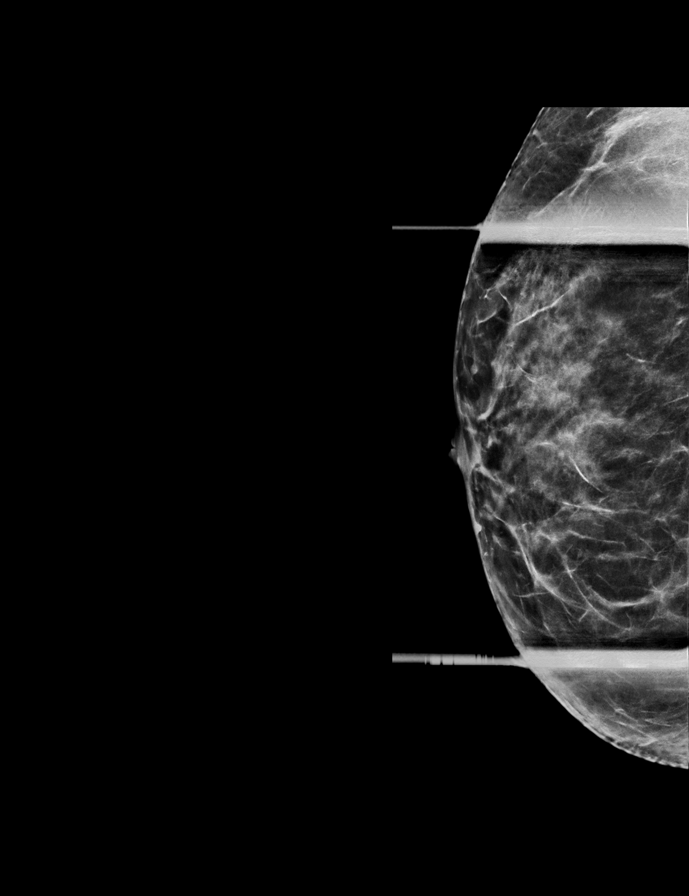

[L MLO synth-2D (2 of 2)]
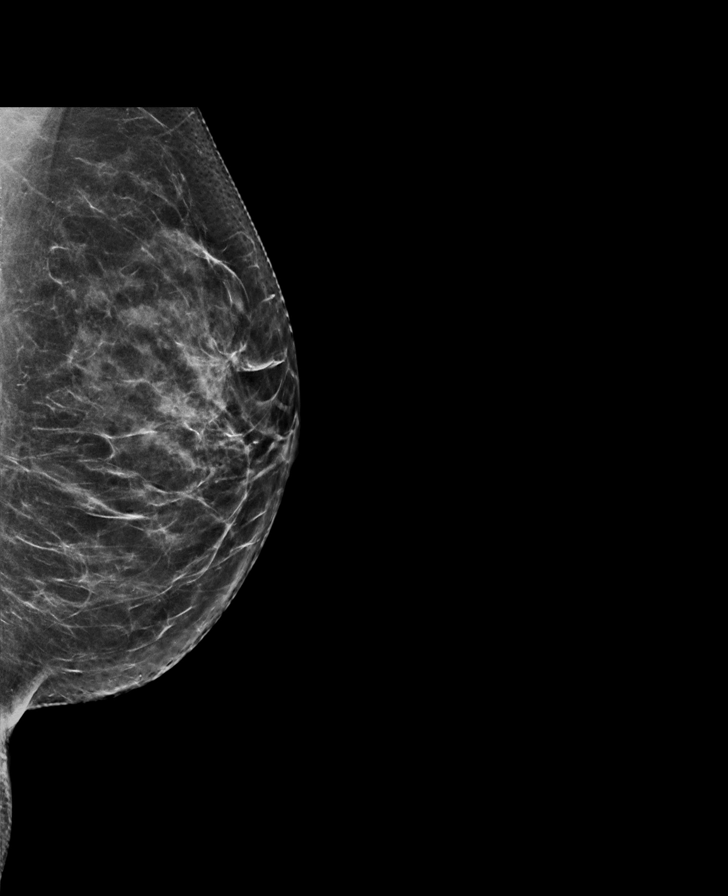

[L MLO (2 of 2)]
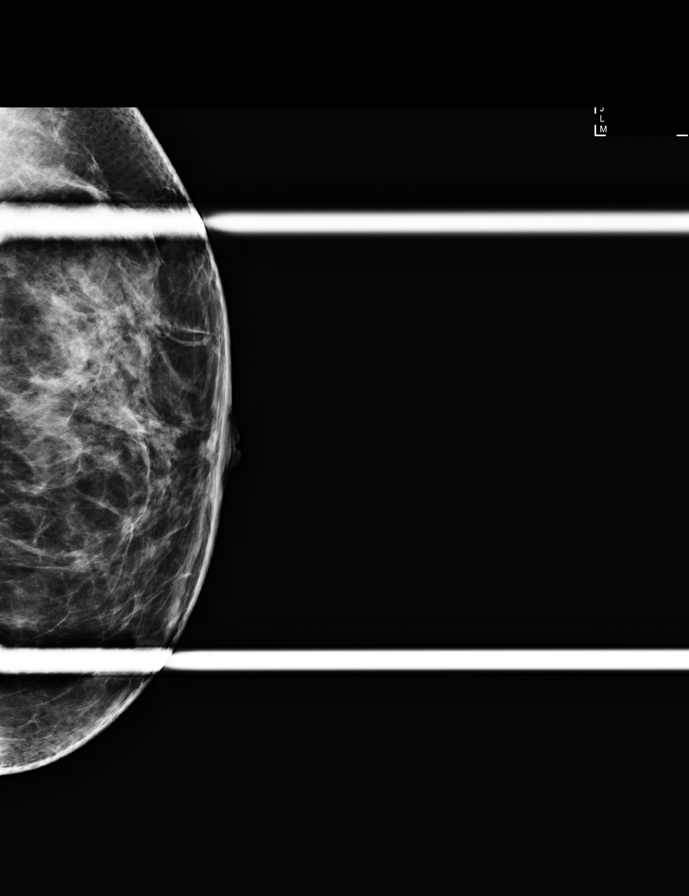

[8 of 40 positions shown; findings below may reference images not displayed]

Baseline exam.

ACR Breast Density Category b: There are scattered areas of
fibroglandular density.
FINDINGS: Bilateral 2D CC and MLO projections were obtained today, with
additional 3D tomosynthesis. Additional spot compression views were
obtained of the bilateral subareolar regions, with additional 3D
tomosynthesis.

There are no dominant masses, suspicious calcifications or secondary
signs of malignancy within either breast.

Specifically, there is no mammographic abnormality within the
subareolar or periareolar regions bilaterally.

Mammographic images were processed with CAD.
IMPRESSION: No evidence of malignancy within either breast.

Patient's description of her bilateral nipple discharge is
compatible with benign physiologic discharge.

RECOMMENDATION:
Screening mammogram at age 40 unless there are persistent or
intervening clinical concerns. (Code:3C-D-NG8)

Benign causes of breast pain, and possible remedies, were discussed
with the patient. Patient was encouraged to follow-up with referring
physician if any bloody or clear nipple discharge was identified.

I have discussed the findings and recommendations with the patient.
Results were also provided in writing at the conclusion of the
visit. If applicable, a reminder letter will be sent to the patient
regarding the next appointment.

BI-RADS CATEGORY  1: Negative.

## 2017-01-23 ENCOUNTER — Other Ambulatory Visit: Payer: Self-pay | Admitting: Family Medicine

## 2017-01-23 MED ORDER — HYDROCODONE-ACETAMINOPHEN 10-325 MG PO TABS: ORAL_TABLET | ORAL | 0 refills | Status: DC

## 2017-02-19 ENCOUNTER — Other Ambulatory Visit: Payer: Self-pay | Admitting: Family Medicine

## 2017-02-19 MED ORDER — HYDROCODONE-ACETAMINOPHEN 10-325 MG PO TABS: ORAL_TABLET | ORAL | 0 refills | Status: DC

## 2017-03-06 ENCOUNTER — Telehealth: Payer: Self-pay | Admitting: Family Medicine

## 2017-03-06 MED ORDER — AMPHETAMINE-DEXTROAMPHETAMINE 20 MG PO TABS: 20.0000 mg | ORAL_TABLET | Freq: Two times a day (BID) | ORAL | 0 refills | Status: DC

## 2017-03-06 NOTE — Telephone Encounter (Signed)
Pt would like a refill for adderall.

## 2017-03-06 NOTE — Telephone Encounter (Signed)
Rx up front for her to pick up. 

## 2017-03-06 NOTE — Telephone Encounter (Signed)
LVM that Rx was ready to be picked up.

## 2017-03-13 ENCOUNTER — Encounter: Payer: Self-pay | Admitting: Family Medicine

## 2017-03-13 ENCOUNTER — Ambulatory Visit (INDEPENDENT_AMBULATORY_CARE_PROVIDER_SITE_OTHER): Payer: Self-pay | Admitting: Family Medicine

## 2017-03-13 VITALS — BP 112/77 | HR 89 | Temp 97.9°F | Wt 148.4 lb

## 2017-03-13 DIAGNOSIS — K649 Unspecified hemorrhoids: Secondary | ICD-10-CM

## 2017-03-13 DIAGNOSIS — M545 Low back pain: Secondary | ICD-10-CM

## 2017-03-13 DIAGNOSIS — F909 Attention-deficit hyperactivity disorder, unspecified type: Secondary | ICD-10-CM

## 2017-03-13 DIAGNOSIS — G8929 Other chronic pain: Secondary | ICD-10-CM

## 2017-03-13 DIAGNOSIS — F411 Generalized anxiety disorder: Secondary | ICD-10-CM

## 2017-03-13 MED ORDER — AMPHETAMINE-DEXTROAMPHETAMINE 20 MG PO TABS: 20.0000 mg | ORAL_TABLET | Freq: Two times a day (BID) | ORAL | 0 refills | Status: DC

## 2017-03-13 MED ORDER — BUPROPION HCL ER (XL) 300 MG PO TB24: 300.0000 mg | ORAL_TABLET | Freq: Every day | ORAL | 3 refills | Status: DC

## 2017-03-13 MED ORDER — ZOLPIDEM TARTRATE 5 MG PO TABS: 5.0000 mg | ORAL_TABLET | Freq: Every day | ORAL | 3 refills | Status: DC

## 2017-03-13 MED ORDER — CLONAZEPAM 0.5 MG PO TABS: 0.5000 mg | ORAL_TABLET | Freq: Two times a day (BID) | ORAL | 2 refills | Status: DC | PRN

## 2017-03-13 NOTE — Progress Notes (Signed)
BP 112/77 (BP Location: Left Arm, Patient Position: Sitting, Cuff Size: Normal)   Pulse 89   Temp 97.9 F (36.6 C)   Wt 148 lb 7 oz (67.3 kg)   SpO2 98%   BMI 25.48 kg/m    Subjective:    Patient ID: Heather King, female    DOB: [birth year], 37 y.o.   MRN: [id number]  HPI: Heather King is a 37 y.o. female  Chief Complaint  Patient presents with  . Pain  . ADD  . Anxiety   ADHD FOLLOW UP ADHD status: stable Satisfied with current therapy: yes Medication compliance:  excellent compliance Controlled substance contract: yes Previous psychiatry evaluation: yes Previous medications: yes    Taking meds on weekends/vacations: no Work/school performance:  good Difficulty sustaining attention/completing tasks: no Distracted by extraneous stimuli: no Does not listen when spoken to: no  Fidgets with hands or feet: no Unable to stay in seat: no Blurts out/interrupts others: no ADHD Medication Side Effects: no    Decreased appetite: no    Headache: no    Sleeping disturbance pattern: no    Irritability: no    Rebound effects (worse than baseline) off medication: no    Anxiousness: no    Dizziness: no    Tics: no  ANXIETY/STRESS- wellbutrin was helping a little for the 1st week, but then stopped working Duration:uncontrolled Anxious mood: yes  Excessive worrying: yes Irritability: yes  Sweating: no Nausea: no Palpitations:no Hyperventilation: no Panic attacks: yes Agoraphobia: no  Obscessions/compulsions: yes Depressed mood: yes Depression screen [facility] 2/9 03/13/2017 12/11/2016 09/30/2016 08/16/2015  Decreased Interest 2 3 3 1   Down, Depressed, Hopeless 2 3 3 2   PHQ - 2 Score 4 6 6 3   Altered sleeping 3 2 3 3   Tired, decreased energy 3 3 3 2   Change in appetite 3 3 2 3   Feeling bad or failure about yourself  2 1 1 2   Trouble concentrating 3 2 2 3   Moving slowly or fidgety/restless 2 2 2 1   Suicidal thoughts 0 0 0 0  PHQ-9 Score 20 19 19 17   Difficult doing  work/chores Very difficult - - Very difficult   Anhedonia: no Weight changes: no Insomnia: yes hard to fall asleep  Hypersomnia: no Fatigue/loss of energy: yes Feelings of worthlessness: no Feelings of guilt: yes Impaired concentration/indecisiveness: yes Suicidal ideations: yes  Crying spells: yes Recent Stressors/Life Changes: yes   Relationship problems: no   Family stress: no     Financial stress: no    Job stress: no    Recent death/loss: no  CHRONIC PAIN- medicine has been helping, has had severe hemorrhoid, which has been hurting a lot more Present dose: 15 Morphine equivalents Pain control status: exacerbated Duration: chronic Location: low back Quality: dull and aching Current Pain Level: 7/10 Previous Pain Level: 10/10 Breakthrough pain: yes Benefit from narcotic medications: yes What Activities task can be accomplished with current medication? Able to get around and work Interested in weaning off narcotics:no   Stool softners/OTC fiber: yes  Previous pain specialty evaluation: no Non-narcotic analgesic meds: yes- did not tolerate them Narcotic contract: yes  Has a hemorrhoid- has been using suppositories, and cold presses and numbing gel, nothing seems to be help. Bleeding a lot.   Relevant past medical, surgical, family and social history reviewed and updated as indicated. Interim medical history since our last visit reviewed. Allergies and medications reviewed and updated.  Review of Systems  Constitutional: Negative.   Respiratory:  Negative.   Cardiovascular: Negative.   Gastrointestinal: Positive for anal bleeding, blood in stool and rectal pain. Negative for abdominal distention, abdominal pain, constipation, diarrhea, nausea and vomiting.  Neurological: Negative.   Psychiatric/Behavioral: Negative.     Per HPI unless specifically indicated above     Objective:    BP 112/77 (BP Location: Left Arm, Patient Position: Sitting, Cuff Size: Normal)    Pulse 89   Temp 97.9 F (36.6 C)   Wt 148 lb 7 oz (67.3 kg)   SpO2 98%   BMI 25.48 kg/m   Wt Readings from Last 3 Encounters:  03/13/17 148 lb 7 oz (67.3 kg)  12/11/16 151 lb 3 oz (68.6 kg)  11/10/16 148 lb (67.1 kg)    Physical Exam  Constitutional: She is oriented to person, place, and time. She appears well-developed and well-nourished. No distress.  HENT:  Head: Normocephalic and atraumatic.  Right Ear: Hearing normal.  Left Ear: Hearing normal.  Nose: Nose normal.  Eyes: Conjunctivae and lids are normal. Right eye exhibits no discharge. Left eye exhibits no discharge. No scleral icterus.  Cardiovascular: Normal rate, regular rhythm, normal heart sounds and intact distal pulses. Exam reveals no gallop and no friction rub.  No murmur heard. Pulmonary/Chest: Effort normal and breath sounds normal. No respiratory distress. She has no wheezes. She has no rales. She exhibits no tenderness.  Musculoskeletal: Normal range of motion.  Neurological: She is alert and oriented to person, place, and time.  Skin: Skin is warm, dry and intact. No rash noted. She is not diaphoretic. No erythema. No pallor.  Psychiatric: She has a normal mood and affect. Her speech is normal and behavior is normal. Judgment and thought content normal. Cognition and memory are normal.  Nursing note and vitals reviewed.   Results for orders placed or performed in visit on 12/11/16  CBC with Differential/Platelet  Result Value Ref Range   WBC 8.3 3.4 - 10.8 x10E3/uL   RBC 3.93 3.77 - 5.28 x10E6/uL   Hemoglobin 12.3 11.1 - 15.9 g/dL   Hematocrit 37.7 34.0 - 46.6 %   MCV 96 79 - 97 fL   MCH 31.3 26.6 - 33.0 pg   MCHC 32.6 31.5 - 35.7 g/dL   RDW 13.1 12.3 - 15.4 %   Platelets 309 150 - 379 x10E3/uL   Neutrophils 70 Not Estab. %   Lymphs 23 Not Estab. %   Monocytes 6 Not Estab. %   Eos 1 Not Estab. %   Basos 0 Not Estab. %   Neutrophils Absolute 5.8 1.4 - 7.0 x10E3/uL   Lymphocytes Absolute 1.9 0.7 - 3.1  x10E3/uL   Monocytes Absolute 0.5 0.1 - 0.9 x10E3/uL   EOS (ABSOLUTE) 0.1 0.0 - 0.4 x10E3/uL   Basophils Absolute 0.0 0.0 - 0.2 x10E3/uL   Immature Granulocytes 0 Not Estab. %   Immature Grans (Abs) 0.0 0.0 - 0.1 x10E3/uL  Comprehensive metabolic panel  Result Value Ref Range   Glucose 80 65 - 99 mg/dL   BUN 13 6 - 20 mg/dL   Creatinine, Ser 0.95 0.57 - 1.00 mg/dL   GFR calc non Af Amer 77 >59 mL/min/1.73   GFR calc Af Amer 89 >59 mL/min/1.73   BUN/Creatinine Ratio 14 9 - 23   Sodium 140 134 - 144 mmol/L   Potassium 4.5 3.5 - 5.2 mmol/L   Chloride 101 96 - 106 mmol/L   CO2 26 20 - 29 mmol/L   Calcium 9.4 8.7 - 10.2 mg/dL  Total Protein 6.8 6.0 - 8.5 g/dL   Albumin 4.5 3.5 - 5.5 g/dL   Globulin, Total 2.3 1.5 - 4.5 g/dL   Albumin/Globulin Ratio 2.0 1.2 - 2.2   Bilirubin Total 0.4 0.0 - 1.2 mg/dL   Alkaline Phosphatase 82 39 - 117 IU/L   AST 16 0 - 40 IU/L   ALT 12 0 - 32 IU/L  TSH  Result Value Ref Range   TSH 3.470 0.450 - 4.500 uIU/mL      Assessment & Plan:   Problem List Items Addressed This Visit      Other   Anxiety disorder    Not under great control. Will increase her wellbutrin to 300mg  XR and recheck in 3 months. Call with any concerns or if not helping.       Relevant Medications   buPROPion (WELLBUTRIN XL) 300 MG 24 hr tablet   Chronic low back pain    Stable on 40 pills a month. Will refill and continue her on the 28 day program. Follow up in 3 months. Call with any concerns.       ADD (attention deficit disorder)    Under good control on current regimen. Continue to monitor. 3 month supply given today. Call with any concerns.       Other Visit Diagnoses    Hemorrhoids, unspecified hemorrhoid type    -  Primary   Really doing poorly. Has failed conservative treatment. Will refer to general surgery. Call with any concerns.    Relevant Orders   Ambulatory referral to General Surgery       Follow up plan: Return in about 3 months (around  06/10/2017).

## 2017-03-13 NOTE — Assessment & Plan Note (Signed)
Not under great control. Will increase her wellbutrin to 300mg  XR and recheck in 3 months. Call with any concerns or if not helping.

## 2017-03-13 NOTE — Assessment & Plan Note (Signed)
Stable on 40 pills a month. Will refill and continue her on the 28 day program. Follow up in 3 months. Call with any concerns.

## 2017-03-13 NOTE — Assessment & Plan Note (Signed)
Under good control on current regimen. Continue to monitor. 3 month supply given today. Call with any concerns.

## 2017-03-19 ENCOUNTER — Other Ambulatory Visit: Payer: Self-pay | Admitting: Family Medicine

## 2017-03-19 MED ORDER — HYDROCODONE-ACETAMINOPHEN 10-325 MG PO TABS: ORAL_TABLET | ORAL | 0 refills | Status: DC

## 2017-03-24 ENCOUNTER — Encounter: Payer: Self-pay | Admitting: General Practice

## 2017-04-08 ENCOUNTER — Other Ambulatory Visit: Payer: Self-pay | Admitting: Family Medicine

## 2017-04-08 MED ORDER — HYDROCODONE-ACETAMINOPHEN 10-325 MG PO TABS: ORAL_TABLET | ORAL | 0 refills | Status: DC

## 2017-05-19 ENCOUNTER — Other Ambulatory Visit: Payer: Self-pay | Admitting: Family Medicine

## 2017-05-19 MED ORDER — HYDROCODONE-ACETAMINOPHEN 10-325 MG PO TABS: ORAL_TABLET | ORAL | 0 refills | Status: DC

## 2017-05-30 ENCOUNTER — Other Ambulatory Visit: Payer: Self-pay | Admitting: Family Medicine

## 2017-06-05 ENCOUNTER — Encounter: Payer: Self-pay | Admitting: Family Medicine

## 2017-06-05 ENCOUNTER — Ambulatory Visit (INDEPENDENT_AMBULATORY_CARE_PROVIDER_SITE_OTHER): Payer: Self-pay | Admitting: Family Medicine

## 2017-06-05 VITALS — BP 117/68 | HR 98 | Temp 98.3°F | Wt 154.2 lb

## 2017-06-05 DIAGNOSIS — F411 Generalized anxiety disorder: Secondary | ICD-10-CM

## 2017-06-05 DIAGNOSIS — E229 Hyperfunction of pituitary gland, unspecified: Secondary | ICD-10-CM

## 2017-06-05 DIAGNOSIS — G8929 Other chronic pain: Secondary | ICD-10-CM

## 2017-06-05 DIAGNOSIS — G10 Huntington's disease: Secondary | ICD-10-CM

## 2017-06-05 DIAGNOSIS — R7989 Other specified abnormal findings of blood chemistry: Secondary | ICD-10-CM

## 2017-06-05 DIAGNOSIS — M545 Low back pain: Secondary | ICD-10-CM

## 2017-06-05 DIAGNOSIS — F29 Unspecified psychosis not due to a substance or known physiological condition: Secondary | ICD-10-CM

## 2017-06-05 DIAGNOSIS — F909 Attention-deficit hyperactivity disorder, unspecified type: Secondary | ICD-10-CM

## 2017-06-05 DIAGNOSIS — Z79899 Other long term (current) drug therapy: Secondary | ICD-10-CM

## 2017-06-05 MED ORDER — CLONAZEPAM 0.5 MG PO TABS: 0.5000 mg | ORAL_TABLET | Freq: Two times a day (BID) | ORAL | 2 refills | Status: DC | PRN

## 2017-06-05 MED ORDER — ZOLPIDEM TARTRATE 5 MG PO TABS: 5.0000 mg | ORAL_TABLET | Freq: Every day | ORAL | 3 refills | Status: DC

## 2017-06-05 MED ORDER — QUETIAPINE FUMARATE 25 MG PO TABS: 25.0000 mg | ORAL_TABLET | Freq: Every day | ORAL | 3 refills | Status: DC

## 2017-06-05 MED ORDER — AMPHETAMINE-DEXTROAMPHETAMINE 20 MG PO TABS: 20.0000 mg | ORAL_TABLET | Freq: Two times a day (BID) | ORAL | 0 refills | Status: DC

## 2017-06-05 NOTE — Progress Notes (Signed)
BP 117/68 (BP Location: Left Arm, Patient Position: Sitting, Cuff Size: Normal)   Pulse 98   Temp 98.3 F (36.8 C)   Wt 154 lb 3 oz (69.9 kg)   SpO2 99%   BMI 26.47 kg/m    Subjective:    Patient ID: Heather King, female    DOB: 10/21/1980, 37 y.o.   MRN: [id number]  HPI: Heather King is a 37 y.o. female  Chief Complaint  Patient presents with  . Anxiety  . Back Pain  . ADD   ANXIETY/STRESS- not feeling well. She notes that she is feeling down and not like herself at all.  Duration:exacerbated Anxious mood: yes  Excessive worrying: yes Irritability: yes  Sweating: no Nausea: no Palpitations:no Hyperventilation: no Panic attacks: no Agoraphobia: no  Obscessions/compulsions: no Depressed mood: yes Depression screen [facility] 2/9 03/13/2017 12/11/2016 09/30/2016 08/16/2015  Decreased Interest 2 3 3 1   Down, Depressed, Hopeless 2 3 3 2   PHQ - 2 Score 4 6 6 3   Altered sleeping 3 2 3 3   Tired, decreased energy 3 3 3 2   Change in appetite 3 3 2 3   Feeling bad or failure about yourself  2 1 1 2   Trouble concentrating 3 2 2 3   Moving slowly or fidgety/restless 2 2 2 1   Suicidal thoughts 0 0 0 0  PHQ-9 Score 20 19 19 17   Difficult doing work/chores Very difficult - - Very difficult   Anhedonia: no Weight changes: no Insomnia: yes   Hypersomnia: yes Fatigue/loss of energy: yes Feelings of worthlessness: yes Feelings of guilt: yes Impaired concentration/indecisiveness: yes Suicidal ideations: no  Crying spells: no Recent Stressors/Life Changes: yes   Relationship problems: no   Family stress: yes     Financial stress: yes    Job stress: yes    Recent death/loss: no  ADHD FOLLOW UP ADHD status: stable Satisfied with current therapy: yes Medication compliance:  excellent compliance Controlled substance contract: yes Previous psychiatry evaluation: yes Previous medications: yes    Taking meds on weekends/vacations: yes Work/school performance:  good Difficulty  sustaining attention/completing tasks: no Distracted by extraneous stimuli: no Does not listen when spoken to: no  Fidgets with hands or feet: no Unable to stay in seat: no Blurts out/interrupts others: no ADHD Medication Side Effects: no    Decreased appetite: no    Headache: no    Sleeping disturbance pattern: no    Irritability: no    Rebound effects (worse than baseline) off medication: no    Anxiousness: no    Dizziness: no    Tics: no  CHRONIC PAIN  Present dose: 15 Morphine equivalents Pain control status: exacerbated Duration: chronic Location: low back Quality: dull and aching Current Pain Level: 7/10 Previous Pain Level: 10/10 Breakthrough pain: yes Benefit from narcotic medications: yes What Activities task can be accomplished with current medication? Able to get around and work Interested in weaning off narcotics:no   Stool softners/OTC fiber: yes  Previous pain specialty evaluation: no Non-narcotic analgesic meds: yes- did not tolerate them Narcotic contract: yes   Relevant past medical, surgical, family and social history reviewed and updated as indicated. Interim medical history since our last visit reviewed. Allergies and medications reviewed and updated.  Review of Systems  Constitutional: Negative.   Respiratory: Negative.   Cardiovascular: Negative.   Skin: Negative.   Neurological: Negative.   Psychiatric/Behavioral: Positive for dysphoric mood. Negative for agitation, behavioral problems, confusion, decreased concentration, hallucinations, self-injury, sleep disturbance and suicidal  ideas. The patient is nervous/anxious. The patient is not hyperactive.     Per HPI unless specifically indicated above     Objective:    BP 117/68 (BP Location: Left Arm, Patient Position: Sitting, Cuff Size: Normal)   Pulse 98   Temp 98.3 F (36.8 C)   Wt 154 lb 3 oz (69.9 kg)   SpO2 99%   BMI 26.47 kg/m   Wt Readings from Last 3 Encounters:  06/05/17 154  lb 3 oz (69.9 kg)  03/13/17 148 lb 7 oz (67.3 kg)  12/11/16 151 lb 3 oz (68.6 kg)    Physical Exam  Constitutional: She is oriented to person, place, and time. She appears well-developed and well-nourished. No distress.  HENT:  Head: Normocephalic and atraumatic.  Right Ear: Hearing normal.  Left Ear: Hearing normal.  Nose: Nose normal.  Eyes: Conjunctivae and lids are normal. Right eye exhibits no discharge. Left eye exhibits no discharge. No scleral icterus.  Cardiovascular: Normal rate, regular rhythm, normal heart sounds and intact distal pulses. Exam reveals no gallop and no friction rub.  No murmur heard. Pulmonary/Chest: Effort normal and breath sounds normal. No stridor. No respiratory distress. She has no wheezes. She has no rales. She exhibits no tenderness.  Musculoskeletal: Normal range of motion.  Neurological: She is alert and oriented to person, place, and time.  Skin: Skin is warm, dry and intact. Capillary refill takes less than 2 seconds. No rash noted. She is not diaphoretic. No erythema. No pallor.  Psychiatric: She has a normal mood and affect. Her speech is normal and behavior is normal. Judgment and thought content normal. Cognition and memory are normal.  Nursing note and vitals reviewed.   Results for orders placed or performed in visit on 12/11/16  CBC with Differential/Platelet  Result Value Ref Range   WBC 8.3 3.4 - 10.8 x10E3/uL   RBC 3.93 3.77 - 5.28 x10E6/uL   Hemoglobin 12.3 11.1 - 15.9 g/dL   Hematocrit 37.7 34.0 - 46.6 %   MCV 96 79 - 97 fL   MCH 31.3 26.6 - 33.0 pg   MCHC 32.6 31.5 - 35.7 g/dL   RDW 13.1 12.3 - 15.4 %   Platelets 309 150 - 379 x10E3/uL   Neutrophils 70 Not Estab. %   Lymphs 23 Not Estab. %   Monocytes 6 Not Estab. %   Eos 1 Not Estab. %   Basos 0 Not Estab. %   Neutrophils Absolute 5.8 1.4 - 7.0 x10E3/uL   Lymphocytes Absolute 1.9 0.7 - 3.1 x10E3/uL   Monocytes Absolute 0.5 0.1 - 0.9 x10E3/uL   EOS (ABSOLUTE) 0.1 0.0 - 0.4  x10E3/uL   Basophils Absolute 0.0 0.0 - 0.2 x10E3/uL   Immature Granulocytes 0 Not Estab. %   Immature Grans (Abs) 0.0 0.0 - 0.1 x10E3/uL  Comprehensive metabolic panel  Result Value Ref Range   Glucose 80 65 - 99 mg/dL   BUN 13 6 - 20 mg/dL   Creatinine, Ser 0.95 0.57 - 1.00 mg/dL   GFR calc non Af Amer 77 >59 mL/min/1.73   GFR calc Af Amer 89 >59 mL/min/1.73   BUN/Creatinine Ratio 14 9 - 23   Sodium 140 134 - 144 mmol/L   Potassium 4.5 3.5 - 5.2 mmol/L   Chloride 101 96 - 106 mmol/L   CO2 26 20 - 29 mmol/L   Calcium 9.4 8.7 - 10.2 mg/dL   Total Protein 6.8 6.0 - 8.5 g/dL   Albumin 4.5 3.5 - 5.5  g/dL   Globulin, Total 2.3 1.5 - 4.5 g/dL   Albumin/Globulin Ratio 2.0 1.2 - 2.2   Bilirubin Total 0.4 0.0 - 1.2 mg/dL   Alkaline Phosphatase 82 39 - 117 IU/L   AST 16 0 - 40 IU/L   ALT 12 0 - 32 IU/L  TSH  Result Value Ref Range   TSH 3.470 0.450 - 4.500 uIU/mL      Assessment & Plan:   Problem List Items Addressed This Visit      Nervous and Auditory   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 Have not been able to review labs. Continue to follow with neurology. Call with any concerns.         Other   Anxiety disorder - Primary    Not under good control. Continue current regimen. We will get her into see psychiatry when she has insurance again. Will start low dose seroquel and recheck 3 months. Call with any concerns.       Relevant Orders   Ambulatory referral to Psychiatry   Chronic low back pain    Stable on 40 pills a month. Will continue her on the 28 day program. Follow up in 3 months. Call with any concerns.       ADD (attention deficit disorder)    Under good control on current regimen. Continue to monitor. 3 month supply given today. Call with any concerns.      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 Continue current regimen. We will get her  into see psychiatry when she has insurance again. Will start low dose seroquel and recheck 3 months. Call with any concerns.       Relevant Orders   Ambulatory referral to Psychiatry    Other Visit Diagnoses    Long-term use of high-risk medication       Utox done today.   Relevant Orders   [id number] 11+Oxyco+Alc+Crt-Bund       Follow up plan: Return in about 3 months (around 09/04/2017).

## 2017-06-05 NOTE — Assessment & Plan Note (Signed)
Not under good control. Continue current regimen. We will get her into see psychiatry when she has insurance again. Will start low dose seroquel and recheck 3 months. Call with any concerns.

## 2017-06-05 NOTE — Assessment & Plan Note (Signed)
Stable on 40 pills a month. Will continue her on the 28 day program. Follow up in 3 months. Call with any concerns.

## 2017-06-05 NOTE — Assessment & Plan Note (Signed)
No insurance right now. Have not been able to review labs. Continue to follow with neurology. Call with any concerns.

## 2017-06-05 NOTE — Assessment & Plan Note (Signed)
Under good control on current regimen. Continue to monitor. 3 month supply given today. Call with any concerns.

## 2017-06-10 ENCOUNTER — Telehealth: Payer: Self-pay | Admitting: Family Medicine

## 2017-06-10 LAB — COCAINE CONF, UR
Benzoylecgonine GC/MS Conf: 902 ng/mL
Cocaine Metab Quant, Ur: POSITIVE — AB

## 2017-06-10 LAB — DRUG SCREEN 764883 11+OXYCO+ALC+CRT-BUND
Amphetamines, Urine: NEGATIVE ng/mL
BENZODIAZ UR QL: NEGATIVE ng/mL
Barbiturate: NEGATIVE ng/mL
CANNABINOID QUANT UR: NEGATIVE ng/mL
CREATININE: 85 mg/dL (ref 20.0–300.0)
Ethanol: NEGATIVE %
MEPERIDINE: NEGATIVE ng/mL
Methadone Screen, Urine: NEGATIVE ng/mL
OPIATE SCREEN URINE: NEGATIVE ng/mL
Oxycodone/Oxymorphone, Urine: NEGATIVE ng/mL
PH OF URINE: 6.3 (ref 4.5–8.9)
PHENCYCLIDINE: NEGATIVE ng/mL
PROPOXYPHENE: NEGATIVE ng/mL
TRAMADOL: NEGATIVE ng/mL

## 2017-06-10 NOTE — Telephone Encounter (Signed)
Patient's UTox tested positive for cocaine- this voids her controlled substance agreement. Please call [facility] and cancel her refills on her ambien and her klonopin and if they have her post-dated adderall Rxs, please have them shred them. Then please send this message back to me so I can let her know that we will no longer be able to provide her controlled substances. I will call her tomorrow after her husband's appointment, as I'd like to get UTox on him as well.

## 2017-06-10 NOTE — Telephone Encounter (Signed)
Prescriptions cancelled.

## 2017-06-24 ENCOUNTER — Encounter: Payer: Self-pay | Admitting: Family Medicine

## 2017-06-25 ENCOUNTER — Encounter: Payer: Self-pay | Admitting: Emergency Medicine

## 2017-06-25 ENCOUNTER — Observation Stay
Admission: EM | Admit: 2017-06-25 | Discharge: 2017-06-26 | Disposition: A | Discharge status: Other Acute Inpatient Hospital | Payer: No Typology Code available for payment source | Attending: Internal Medicine | Admitting: Internal Medicine

## 2017-06-25 ENCOUNTER — Other Ambulatory Visit: Payer: Self-pay

## 2017-06-25 DIAGNOSIS — Z79891 Long term (current) use of opiate analgesic: Secondary | ICD-10-CM | POA: Insufficient documentation

## 2017-06-25 DIAGNOSIS — T426X2A Poisoning by other antiepileptic and sedative-hypnotic drugs, intentional self-harm, initial encounter: Secondary | ICD-10-CM | POA: Insufficient documentation

## 2017-06-25 DIAGNOSIS — G4701 Insomnia due to medical condition: Secondary | ICD-10-CM | POA: Insufficient documentation

## 2017-06-25 DIAGNOSIS — F332 Major depressive disorder, recurrent severe without psychotic features: Secondary | ICD-10-CM | POA: Diagnosis not present

## 2017-06-25 DIAGNOSIS — T39312A Poisoning by propionic acid derivatives, intentional self-harm, initial encounter: Secondary | ICD-10-CM | POA: Insufficient documentation

## 2017-06-25 DIAGNOSIS — Z79899 Other long term (current) drug therapy: Secondary | ICD-10-CM | POA: Insufficient documentation

## 2017-06-25 DIAGNOSIS — K219 Gastro-esophageal reflux disease without esophagitis: Secondary | ICD-10-CM | POA: Insufficient documentation

## 2017-06-25 DIAGNOSIS — G10 Huntington's disease: Secondary | ICD-10-CM | POA: Diagnosis present

## 2017-06-25 DIAGNOSIS — Y92009 Unspecified place in unspecified non-institutional (private) residence as the place of occurrence of the external cause: Secondary | ICD-10-CM | POA: Insufficient documentation

## 2017-06-25 DIAGNOSIS — F988 Other specified behavioral and emotional disorders with onset usually occurring in childhood and adolescence: Secondary | ICD-10-CM | POA: Insufficient documentation

## 2017-06-25 DIAGNOSIS — G894 Chronic pain syndrome: Secondary | ICD-10-CM | POA: Insufficient documentation

## 2017-06-25 DIAGNOSIS — F419 Anxiety disorder, unspecified: Secondary | ICD-10-CM | POA: Insufficient documentation

## 2017-06-25 DIAGNOSIS — T391X2A Poisoning by 4-Aminophenol derivatives, intentional self-harm, initial encounter: Principal | ICD-10-CM | POA: Insufficient documentation

## 2017-06-25 DIAGNOSIS — E876 Hypokalemia: Secondary | ICD-10-CM | POA: Insufficient documentation

## 2017-06-25 DIAGNOSIS — R45851 Suicidal ideations: Secondary | ICD-10-CM | POA: Insufficient documentation

## 2017-06-25 DIAGNOSIS — T424X2A Poisoning by benzodiazepines, intentional self-harm, initial encounter: Secondary | ICD-10-CM | POA: Insufficient documentation

## 2017-06-25 DIAGNOSIS — Z8541 Personal history of malignant neoplasm of cervix uteri: Secondary | ICD-10-CM | POA: Insufficient documentation

## 2017-06-25 DIAGNOSIS — T50901A Poisoning by unspecified drugs, medicaments and biological substances, accidental (unintentional), initial encounter: Secondary | ICD-10-CM | POA: Diagnosis present

## 2017-06-25 DIAGNOSIS — T1491XA Suicide attempt, initial encounter: Secondary | ICD-10-CM

## 2017-06-25 DIAGNOSIS — F1410 Cocaine abuse, uncomplicated: Secondary | ICD-10-CM | POA: Insufficient documentation

## 2017-06-25 DIAGNOSIS — T43212A Poisoning by selective serotonin and norepinephrine reuptake inhibitors, intentional self-harm, initial encounter: Secondary | ICD-10-CM | POA: Insufficient documentation

## 2017-06-25 LAB — COMPREHENSIVE METABOLIC PANEL
ALK PHOS: 87 U/L (ref 38–126)
ALT: 18 U/L (ref 14–54)
AST: 23 U/L (ref 15–41)
Albumin: 4 g/dL (ref 3.5–5.0)
Anion gap: 11 (ref 5–15)
BILIRUBIN TOTAL: 0.2 mg/dL — AB (ref 0.3–1.2)
BUN: 9 mg/dL (ref 6–20)
CALCIUM: 8.9 mg/dL (ref 8.9–10.3)
CO2: 24 mmol/L (ref 22–32)
CREATININE: 0.67 mg/dL (ref 0.44–1.00)
Chloride: 104 mmol/L (ref 101–111)
GFR calc Af Amer: >60 mL/min (ref >60)
Glucose, Bld: 145 mg/dL — ABNORMAL HIGH (ref 65–99)
Potassium: 3.1 mmol/L — ABNORMAL LOW (ref 3.5–5.1)
Sodium: 139 mmol/L (ref 135–145)
TOTAL PROTEIN: 7.3 g/dL (ref 6.5–8.1)

## 2017-06-25 LAB — CBC
HCT: 38.5 % (ref 35.0–47.0)
Hemoglobin: 13.4 g/dL (ref 12.0–16.0)
MCH: 33.4 pg (ref 26.0–34.0)
MCHC: 34.8 g/dL (ref 32.0–36.0)
MCV: 95.9 fL (ref 80.0–100.0)
PLATELETS: 376 10*3/uL (ref 150–440)
RBC: 4.01 MIL/uL (ref 3.80–5.20)
RDW: 13.1 % (ref 11.5–14.5)
WBC: 8.2 10*3/uL (ref 3.6–11.0)

## 2017-06-25 LAB — URINE DRUG SCREEN, QUALITATIVE (ARMC ONLY)
Amphetamines, Ur Screen: NOT DETECTED
BARBITURATES, UR SCREEN: NOT DETECTED
Benzodiazepine, Ur Scrn: NOT DETECTED
CANNABINOID 50 NG, UR ~~LOC~~: NOT DETECTED
Cocaine Metabolite,Ur ~~LOC~~: POSITIVE — AB
MDMA (ECSTASY) UR SCREEN: NOT DETECTED
Methadone Scn, Ur: NOT DETECTED
Opiate, Ur Screen: NOT DETECTED
PHENCYCLIDINE (PCP) UR S: NOT DETECTED
TRICYCLIC, UR SCREEN: NOT DETECTED

## 2017-06-25 LAB — ACETAMINOPHEN LEVEL: Acetaminophen (Tylenol), Serum: <10 ug/mL — ABNORMAL LOW (ref 10–30)

## 2017-06-25 LAB — SALICYLATE LEVEL

## 2017-06-25 LAB — POCT PREGNANCY, URINE: Preg Test, Ur: NEGATIVE

## 2017-06-25 LAB — ETHANOL

## 2017-06-25 MED ORDER — SODIUM CHLORIDE 0.9% FLUSH: 3.0000 mL | Freq: Two times a day (BID) | INTRAVENOUS | Status: DC

## 2017-06-25 MED ORDER — ACETAMINOPHEN 650 MG RE SUPP: 650.0000 mg | Freq: Four times a day (QID) | RECTAL | Status: DC | PRN

## 2017-06-25 MED ORDER — ONDANSETRON HCL 4 MG PO TABS: 4.0000 mg | ORAL_TABLET | Freq: Four times a day (QID) | ORAL | Status: DC | PRN

## 2017-06-25 MED ORDER — LORATADINE 10 MG PO TABS
10.0000 mg | ORAL_TABLET | Freq: Every day | ORAL | Status: DC
  Administered 2017-06-25 – 2017-06-26 (×2): 10 mg via ORAL
  Filled 2017-06-25 (×2): qty 1

## 2017-06-25 MED ORDER — SENNOSIDES-DOCUSATE SODIUM 8.6-50 MG PO TABS: 1.0000 | ORAL_TABLET | Freq: Every evening | ORAL | Status: DC | PRN

## 2017-06-25 MED ORDER — BISACODYL 5 MG PO TBEC: 5.0000 mg | DELAYED_RELEASE_TABLET | Freq: Every day | ORAL | Status: DC | PRN

## 2017-06-25 MED ORDER — SODIUM CHLORIDE 0.9% FLUSH: 3.0000 mL | INTRAVENOUS | Status: DC | PRN

## 2017-06-25 MED ORDER — ONDANSETRON HCL 4 MG/2ML IJ SOLN: 4.0000 mg | Freq: Four times a day (QID) | INTRAMUSCULAR | Status: DC | PRN

## 2017-06-25 MED ORDER — PANTOPRAZOLE SODIUM 40 MG PO TBEC
40.0000 mg | DELAYED_RELEASE_TABLET | Freq: Every day | ORAL | Status: DC
  Administered 2017-06-26: 09:00:00 40 mg via ORAL
  Filled 2017-06-25: qty 1

## 2017-06-25 MED ORDER — ENOXAPARIN SODIUM 40 MG/0.4ML ~~LOC~~ SOLN
40.0000 mg | SUBCUTANEOUS | Status: DC
  Administered 2017-06-25: 22:00:00 40 mg via SUBCUTANEOUS
  Filled 2017-06-25: qty 0.4

## 2017-06-25 MED ORDER — SODIUM CHLORIDE 0.9 % IV SOLN
INTRAVENOUS | Status: DC
  Administered 2017-06-25 – 2017-06-26 (×3): via INTRAVENOUS

## 2017-06-25 MED ORDER — VENLAFAXINE HCL ER 150 MG PO CP24
300.0000 mg | ORAL_CAPSULE | Freq: Every day | ORAL | Status: DC
Start: 2017-06-26 — End: 2017-06-26
  Filled 2017-06-25: qty 2
  Filled 2017-06-25 (×2): qty 4

## 2017-06-25 MED ORDER — ALBUTEROL SULFATE (2.5 MG/3ML) 0.083% IN NEBU: 2.5000 mg | INHALATION_SOLUTION | RESPIRATORY_TRACT | Status: DC | PRN

## 2017-06-25 MED ORDER — HYDROCODONE-ACETAMINOPHEN 5-325 MG PO TABS
1.0000 | ORAL_TABLET | ORAL | Status: DC | PRN
  Administered 2017-06-25 – 2017-06-26 (×3): 2 via ORAL
  Filled 2017-06-25 (×3): qty 2

## 2017-06-25 MED ORDER — ACETAMINOPHEN 325 MG PO TABS
650.0000 mg | ORAL_TABLET | Freq: Four times a day (QID) | ORAL | Status: DC | PRN
  Administered 2017-06-26: 650 mg via ORAL
  Filled 2017-06-25: qty 2

## 2017-06-25 MED ORDER — PANTOPRAZOLE SODIUM 40 MG PO TBEC
40.0000 mg | DELAYED_RELEASE_TABLET | Freq: Every day | ORAL | Status: DC
  Administered 2017-06-25: 40 mg via ORAL
  Filled 2017-06-25: qty 1

## 2017-06-25 MED ORDER — SODIUM CHLORIDE 0.9 % IV SOLN: 250.0000 mL | INTRAVENOUS | Status: DC | PRN

## 2017-06-25 NOTE — ED Triage Notes (Signed)
Arrives with spouse.  Patient states patient has overdosed on Klonidine, Xanax, and "a bunch of other stuff".  Patient states she does not want to live any more.  States she has Huntington's disease and just "can't deal with it anymore".

## 2017-06-25 NOTE — H&P (Signed)
[facility] [place] NAME: Heather King    MR#:  [id number]  DATE OF BIRTH:  10/05/1980  DATE OF ADMISSION:  06/25/2017  PRIMARY CARE PHYSICIAN: Valerie Roys, DO   REQUESTING/REFERRING PHYSICIAN: Dr. Quentin Cornwall.  CHIEF COMPLAINT:   Chief Complaint  Patient presents with  . Suicidal   Drug overdose and suicidal HISTORY OF PRESENT ILLNESS:  Heather King  is a 37 y.o. female with a known history of multiple medical problems as below.  The patient presents the ED after polysubstance overdose. She wanted to kill herself and took a lot of medication at about 2-3 PM today.  She refused severely depressed due to Huntington's disease. she ingested a whole bottle of melatonin, approximately 50 gabapentin, 15 Klonopin, 12 Effexor, a handful of Motrin and a handful of Tylenol.  She states that she is starting to feel sleepy no nausea or vomiting after the ingestion.  She complains of palpitation in the ED.  Dr. Weber Cooks see the patient and suggested admit to behavioral medicine after she is medically stable.  Dr. Quentin Cornwall discussed with [facility] staff, who suggested telemetry monitor for arrhythmia. PAST MEDICAL HISTORY:   Past Medical History:  Diagnosis Date  . Acute headache   . ADD (attention deficit disorder)   . Anxiety   . Aseptic meningitis   . Bipolar disorder ([place])   . Breast discharge x 6 months   Bilateral c pain  . Cancer (HCC)    Cervical Cancer  . Cervical pain   . Chronic pain syndrome   . Chronic tension type headache    not intractable  . Depression   . GERD (gastroesophageal reflux disease)   . History of cervical dysplasia   . Huntington disease ([place])   . Insomnia due to medical condition   . Loss of memory   . Mild neurocognitive disorder    attention and concentration problemss  . Rectal bleeding   . Rectal pain   . Seizure-like activity ([place])     PAST SURGICAL HISTORY:   Past Surgical History:    Procedure Laterality Date  . ABDOMINAL HYSTERECTOMY  2013  . CHOLECYSTECTOMY  2012  . LEEP  2007    SOCIAL HISTORY:   Social History   Tobacco Use  . Smoking status: Never Smoker  . Smokeless tobacco: Never Used  Substance Use Topics  . Alcohol use: Yes    Comment: On occasion    FAMILY HISTORY:   Family History  Problem Relation Age of Onset  . Hypertension Mother   . Diabetes Mother   . Mental illness Mother   . Depression Mother   . Cancer Father        lung  . Alcohol abuse Father   . Dementia Maternal Grandmother   . Cancer Maternal Grandmother        breast  . Dementia Maternal Grandfather   . Cancer Maternal Grandfather        Lung  . Diabetes Brother   . ADD / ADHD Brother   . Breast cancer Maternal Aunt 43    DRUG ALLERGIES:   Allergies  Allergen Reactions  . Abilify [Aripiprazole] Other (See Comments)    akathisia  . Gabapentin Rash  . Nifedipine Rash    To cream    REVIEW OF SYSTEMS:   Review of Systems  Constitutional: Positive for malaise/fatigue. Negative for chills and fever.  HENT: Negative for sore throat.   Eyes:  Negative for blurred vision and double vision.  Respiratory: Negative for cough, hemoptysis, shortness of breath, wheezing and stridor.   Cardiovascular: Positive for palpitations. Negative for chest pain, orthopnea and leg swelling.  Gastrointestinal: Negative for abdominal pain, blood in stool, diarrhea, melena, nausea and vomiting.  Genitourinary: Negative for dysuria, flank pain and hematuria.  Musculoskeletal: Negative for back pain and joint pain.  Neurological: Negative for dizziness, sensory change, focal weakness, seizures, loss of consciousness, weakness and headaches.  Endo/Heme/Allergies: Negative for polydipsia.  Psychiatric/Behavioral: Positive for depression and suicidal ideas. The patient is nervous/anxious.     MEDICATIONS AT HOME:   Prior to Admission medications   Medication Sig Start Date End Date  Taking? Authorizing Provider  amphetamine-dextroamphetamine (ADDERALL) 20 MG tablet Take 1 tablet (20 mg total) by mouth 2 (two) times daily. 06/05/17   Park Liter P, DO  cetirizine (ZYRTEC) 10 MG tablet Take by mouth. 07/31/15 07/30/16  [provider]  clonazePAM (KLONOPIN) 0.5 MG tablet Take 1 tablet (0.5 mg total) by mouth 2 (two) times daily as needed for anxiety. 06/05/17   Johnson, Megan P, DO  esomeprazole (NEXIUM) 40 MG capsule Take 1 capsule (40 mg total) by mouth daily. 10/18/15   Johnson, Megan P, DO  HYDROcodone-acetaminophen (NORCO) 10-325 MG tablet TAKE 1 TABLET BY MOUTH EVERY 8HRS AS NEEDED FOR PAIN. DO NOT TAKE WITH YOUR VALIUM. 05/19/17   Johnson, Megan P, DO  QUEtiapine (SEROQUEL) 25 MG tablet Take 1 tablet (25 mg total) by mouth at bedtime. 06/05/17   Johnson, Megan P, DO  venlafaxine XR (EFFEXOR-XR) 150 MG 24 hr capsule Take 2 capsules (300 mg total) by mouth daily. 11/03/16   Johnson, Megan P, DO  zolpidem (AMBIEN) 5 MG tablet Take 1 tablet (5 mg total) by mouth at bedtime. 06/05/17   Park Liter P, DO      VITAL SIGNS:  Blood pressure 113/77, pulse 98, temperature 98.2 F (36.8 C), temperature source Oral, resp. rate (!) 24, height 5\' 2"  (1.575 m), weight 137 lb (62.1 kg), SpO2 100 %.  PHYSICAL EXAMINATION:  Physical Exam  GENERAL:  37 y.o.-year-old patient lying in the bed with no acute distress.  EYES: Pupils equal, round, reactive to light and accommodation. No scleral icterus. Extraocular muscles intact.  HEENT: Head atraumatic, normocephalic. Oropharynx and nasopharynx clear.  NECK:  Supple, no jugular venous distention. No thyroid enlargement, no tenderness.  LUNGS: Normal breath sounds bilaterally, no wheezing, rales,rhonchi or crepitation. No use of accessory muscles of respiration.  CARDIOVASCULAR: S1, S2 normal. No murmurs, rubs, or gallops.  ABDOMEN: Soft, nontender, nondistended. Bowel sounds present. No organomegaly or mass.  EXTREMITIES: No pedal  edema, cyanosis, or clubbing.  NEUROLOGIC: Cranial nerves II through XII are intact. Muscle strength 5/5 in all extremities. Sensation intact. Gait not checked.  PSYCHIATRIC: The patient is alert and oriented x 3.  SKIN: No obvious rash, lesion, or ulcer.   LABORATORY PANEL:   CBC Recent Labs  Lab 06/25/17 1637  WBC 8.2  HGB 13.4  HCT 38.5  PLT 376   ------------------------------------------------------------------------------------------------------------------  Chemistries  Recent Labs  Lab 06/25/17 1637  NA 139  K 3.1*  CL 104  CO2 24  GLUCOSE 145*  BUN 9  CREATININE 0.67  CALCIUM 8.9  AST 23  ALT 18  ALKPHOS 87  BILITOT 0.2*   ------------------------------------------------------------------------------------------------------------------  Cardiac Enzymes No results for input(s): TROPONINI in the last 168 hours. ------------------------------------------------------------------------------------------------------------------  RADIOLOGY:  No results found.    IMPRESSION AND PLAN:  Drug overdose. The patient will be placed for observation. Continue telemetry monitor, IV fluid support.  Hold home medication.  Hypokalemia.  Give potassium supplement in the follow-up level.  Suicidal ideation and major depression. Follow-up Dr. Weber Cooks recommendation. The patient will be discharged to behavioral medicine after she is medically stable.  Cocaine abuse.  Counseled. All the records are reviewed and case discussed with ED provider. Management plans discussed with the patient, family and they are in agreement.  CODE STATUS: Full code.  TOTAL TIME TAKING CARE OF THIS PATIENT: 40 minutes.    Demetrios Loll M.D on 06/25/2017 at 8:29 PM  Between 7am to 6pm - Pager - [phone]  After 6pm go to [email or web address] - Proofreader  [facility]  Office  [phone]  CC: Primary care physician; Valerie Roys, DO   Note: This  dictation was prepared with Dragon dictation along with smaller phrase technology. Any transcriptional errors that result from this process are unin

## 2017-06-25 NOTE — ED Notes (Signed)
Pt able to ambulate to bathroom with minimal assistance

## 2017-06-25 NOTE — Consult Note (Signed)
[facility] Face-to-Face Psychiatry Consult   Reason for Consult: Consult for 37 year old woman with Huntington's disease who comes into the hospital after an intentional overdose Referring Physician: Quentin Cornwall Patient Identification: Heather King MRN:  [id number] Principal Diagnosis: Severe recurrent major depression without psychotic features [facility]) Diagnosis:   Patient Active Problem List   Diagnosis Date Noted  . Severe recurrent major depression without psychotic features ([place]) [F33.2] 06/25/2017  . Suicide attempt ([place]) [T14.91XA] 06/25/2017  . 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 Migraine headache with aura [G43.109] 04/08/2016  . Elevated prolactin level ([place]) [E22.9] 08/17/2015  . Controlled substance agreement signed [Z79.899] 08/16/2015  . Anxiety disorder [F41.9] 07/19/2015  . Huntington's disease ([place]) [G10] 07/19/2015  . Chronic low back pain [M54.5, G89.29] 07/19/2015  . Chronic headaches [R51] 07/19/2015  . GERD (gastroesophageal reflux disease) [K21.9] 07/19/2015  . ADD (attention deficit disorder) [F98.8] 07/19/2015    Total Time spent with patient: 1 hour  Subjective:   Heather King is a 37 y.o. female patient admitted with "it has just gotten too much for me and I cannot go on".  HPI: Patient interviewed.  Chart reviewed.  This is a 37 year old woman who was diagnosed a couple years ago with Huntington's disease.  Today she says that she took an overdose of multiple pills at home.  She told me that she took "Effexor, Klonopin, Wellbutrin, Tylenol, melatonin".  She says that while it was somewhat impulsive she definitely was hoping that she would die.  She is not certain how she came to be brought to the hospital.  She remembers her uncle pounding on the door of her home although it sounds like her husband was here in the emergency room with her as well.  Patient reports that her mood is severely depressed and has been getting worse for months.  She stays in bed almost  constantly.  Does not sleep well at night with frequent nightmares and awakening.  Physical condition has been declining with more unsteadiness and difficulty with gait.  Patient admits that she used some marijuana yesterday and claims she had not done that for years.  She drinks alcohol only occasionally.  She used cocaine about 2 weeks ago impulsively which resulted in her primary care doctor cutting off all of her controlled substances.  Social history: Does not work outside the home.  Lives with her husband and one adult daughter.  Several other adult children in the area.  Her mother also lives nearby.  Substance abuse history: Patient denies having had a substance abuse problem in the past but admits to using cocaine a couple weeks ago impulsively out of boredom and marijuana yesterday.  Medical history: Diagnosed a couple years ago with Huntington's disease while at [facility].  By her description has had multiple neurologic problems as well as worsening dementia and depression  Past Psychiatric History: Patient has had prior hospitalizations and prior suicide attempts.  She has been taking Effexor for years.  Recently added Wellbutrin.  Used to see a psychiatrist at [facility] but can no longer travel and so she is relying on her primary care doctor.  Risk to Self: Suicidal Ideation: Yes-Currently Present Suicidal Intent: Yes-Currently Present Is patient at risk for suicide?: Yes Suicidal Plan?: Yes-Currently Present Specify Current Suicidal Plan: OD on medications Access to Means: Yes Specify Access to Suicidal Means: prescriptions What has been your use of drugs/alcohol within the last 12 months?: THC How many times?: 0 Other Self Harm Risks: None reported Triggers  for Past Attempts: Other (Comment)(None reported ) Intentional Self Injurious Behavior: None Risk to Others: Homicidal Ideation: No Thoughts of Harm to Others: No Current Homicidal Intent: No Current Homicidal Plan:  No Access to Homicidal Means: No Identified Victim: None reported History of harm to others?: No Assessment of Violence: None Noted Violent Behavior Description: None reported Does patient have access to weapons?: No Criminal Charges Pending?: Yes Describe Pending Criminal Charges: dwlr not impaired rev, suspended tag Does patient have a court date: Yes Court Date: 08/20/17 Prior Inpatient Therapy: Prior Inpatient Therapy: Yes Prior Therapy Dates: 2004 Prior Therapy Facilty/Provider(s): [facility]  Reason for Treatment: Depression Prior Outpatient Therapy: Prior Outpatient Therapy: Yes Prior Therapy Dates: 2017 Prior Therapy Facilty/Provider(s): [facility] Reason for Treatment: Depression and Anxiety  Does patient have an ACCT team?: No Does patient have Intensive In-House Services?  : No Does patient have [facility] services? : No Does patient have P4CC services?: No  Past Medical History:  Past Medical History:  Diagnosis Date  . Acute headache   . ADD (attention deficit disorder)   . Anxiety   . Aseptic meningitis   . Bipolar disorder ([place])   . Breast discharge x 6 months   Bilateral c pain  . Cancer (HCC)    Cervical Cancer  . Cervical pain   . Chronic pain syndrome   . Chronic tension type headache    not intractable  . Depression   . GERD (gastroesophageal reflux disease)   . History of cervical dysplasia   . Huntington disease ([place])   . Insomnia due to medical condition   . Loss of memory   . Mild neurocognitive disorder    attention and concentration problemss  . Rectal bleeding   . Rectal pain   . Seizure-like activity [facility])     Past Surgical History:  Procedure Laterality Date  . ABDOMINAL HYSTERECTOMY  2013  . CHOLECYSTECTOMY  2012  . LEEP  2007   Family History:  Family History  Problem Relation Age of Onset  . Hypertension Mother   . Diabetes Mother   . Mental illness Mother   . Depression Mother   . Cancer Father        lung  . Alcohol abuse Father    . Dementia Maternal Grandmother   . Cancer Maternal Grandmother        breast  . Dementia Maternal Grandfather   . Cancer Maternal Grandfather        Lung  . Diabetes Brother   . ADD / ADHD Brother   . Breast cancer Maternal Aunt 26   Family Psychiatric  History: In the course of her diagnosis it was confirmed that her father had Huntington's disease.  No other history known. Social History:  Social History   Substance and Sexual Activity  Alcohol Use Yes   Comment: On occasion     Social History   Substance and Sexual Activity  Drug Use No    Social History   Socioeconomic History  . Marital status: Married    Spouse name: Not on file  . Number of children: Not on file  . Years of education: Not on file  . Highest education level: Not on file  Occupational History  . Not on file  Social Needs  . Financial resource strain: Not on file  . Food insecurity:    Worry: Not on file    Inability: Not on file  . Transportation needs:    Medical: Not on file  Non-medical: Not on file  Tobacco Use  . Smoking status: Never Smoker  . Smokeless tobacco: Never Used  Substance and Sexual Activity  . Alcohol use: Yes    Comment: On occasion  . Drug use: No  . Sexual activity: Yes    Birth control/protection: Surgical  Lifestyle  . Physical activity:    Days per week: Not on file    Minutes per session: Not on file  . Stress: Not on file  Relationships  . Social connections:    Talks on phone: Not on file    Gets together: Not on file    Attends religious service: Not on file    Active member of club or organization: Not on file    Attends meetings of clubs or organizations: Not on file    Relationship status: Not on file  Other Topics Concern  . Not on file  Social History Narrative  . Not on file   Additional Social History:    Allergies:   Allergies  Allergen Reactions  . Abilify [Aripiprazole] Other (See Comments)    akathisia  . Gabapentin Rash  .  Nifedipine Rash    To cream    Labs:  Results for orders placed or performed during the hospital encounter of 06/25/17 (from the past 48 hour(s))  Comprehensive metabolic panel     Status: Abnormal   Collection Time: 06/25/17  4:37 PM  Result Value Ref Range   Sodium 139 135 - 145 mmol/L   Potassium 3.1 (L) 3.5 - 5.1 mmol/L   Chloride 104 101 - 111 mmol/L   CO2 24 22 - 32 mmol/L   Glucose, Bld 145 (H) 65 - 99 mg/dL   BUN 9 6 - 20 mg/dL   Creatinine, Ser 0.67 0.44 - 1.00 mg/dL   Calcium 8.9 8.9 - 10.3 mg/dL   Total Protein 7.3 6.5 - 8.1 g/dL   Albumin 4.0 3.5 - 5.0 g/dL   AST 23 15 - 41 U/L   ALT 18 14 - 54 U/L   Alkaline Phosphatase 87 38 - 126 U/L   Total Bilirubin 0.2 (L) 0.3 - 1.2 mg/dL   GFR calc non Af Amer >60 >60 mL/min   GFR calc Af Amer >60 >60 mL/min    Comment: (NOTE) The eGFR has been calculated using the CKD EPI equation. This calculation has not been validated in all clinical situations. eGFR's persistently <60 mL/min signify possible Chronic Kidney Disease.    Anion gap 11 5 - 15    Comment: Performed at [facility], [address] [place], [place] [address]  Ethanol     Status: None   Collection Time: 06/25/17  4:37 PM  Result Value Ref Range   Alcohol, Ethyl (B) <10 <10 mg/dL    Comment: (NOTE) Lowest detectable limit for serum alcohol is 10 mg/dL. For medical purposes only. Performed at [facility], [address] [place], [place] [address]   Salicylate level     Status: None   Collection Time: 06/25/17  4:37 PM  Result Value Ref Range   Salicylate Lvl <9.6 2.8 - 30.0 mg/dL    Comment: Performed at [facility], [address] [place], [place] [address]  Acetaminophen level     Status: Abnormal   Collection Time: 06/25/17  4:37 PM  Result Value Ref Range   Acetaminophen (Tylenol), Serum <10 (L) 10 - 30 ug/mL    Comment: (NOTE) Therapeutic concentrations vary significantly. A range of 10-30 ug/mL  may be an  effective concentration for many patients. However, some  are best treated at concentrations outside of this range. Acetaminophen concentrations >150 ug/mL at 4 hours after ingestion  and >50 ug/mL at 12 hours after ingestion are often associated with  toxic reactions. Performed at [facility], [address] [place], [place] [address]   cbc     Status: None   Collection Time: 06/25/17  4:37 PM  Result Value Ref Range   WBC 8.2 3.6 - 11.0 K/uL   RBC 4.01 3.80 - 5.20 MIL/uL   Hemoglobin 13.4 12.0 - 16.0 g/dL   HCT 38.5 35.0 - 47.0 %   MCV 95.9 80.0 - 100.0 fL   MCH 33.4 26.0 - 34.0 pg   MCHC 34.8 32.0 - 36.0 g/dL   RDW 13.1 11.5 - 14.5 %   Platelets 376 150 - 440 K/uL    Comment: Performed at [facility] [address] [place],  [address]  Pregnancy, urine POC     Status: None   Collection Time: 06/25/17  6:11 PM  Result Value Ref Range   Preg Test, Ur NEGATIVE NEGATIVE    Comment:        THE SENSITIVITY OF THIS METHODOLOGY IS >24 mIU/mL     No current facility-administered medications for this encounter.    Current Outpatient Medications  Medication Sig Dispense Refill  . amphetamine-dextroamphetamine (ADDERALL) 20 MG tablet Take 1 tablet (20 mg total) by mouth 2 (two) times daily. 60 tablet 0  . cetirizine (ZYRTEC) 10 MG tablet Take by mouth.    . clonazePAM (KLONOPIN) 0.5 MG tablet Take 1 tablet (0.5 mg total) by mouth 2 (two) times daily as needed for anxiety. 45 tablet 2  . esomeprazole (NEXIUM) 40 MG capsule Take 1 capsule (40 mg total) by mouth daily. 30 capsule 3  . HYDROcodone-acetaminophen (NORCO) 10-325 MG tablet TAKE 1 TABLET BY MOUTH EVERY 8HRS AS NEEDED FOR PAIN. DO NOT TAKE WITH YOUR VALIUM. 40 tablet 0  . QUEtiapine (SEROQUEL) 25 MG tablet Take 1 tablet (25 mg total) by mouth at bedtime. 30 tablet 3  . venlafaxine XR (EFFEXOR-XR) 150 MG 24 hr capsule Take 2 capsules (300 mg total) by mouth daily. 180 capsule 3  . zolpidem  (AMBIEN) 5 MG tablet Take 1 tablet (5 mg total) by mouth at bedtime. 30 tablet 3    Musculoskeletal: Strength & Muscle Tone: decreased Gait & Station: unsteady Patient leans: N/A  Psychiatric Specialty Exam: Physical Exam  Nursing note and vitals reviewed. Constitutional: She appears well-developed and well-nourished.  HENT:  Head: Normocephalic and atraumatic.  Eyes: Pupils are equal, round, and reactive to light. Conjunctivae are normal.  Neck: Normal range of motion.  Cardiovascular: Regular rhythm and normal heart sounds.  Respiratory: Effort normal.  GI: Soft.  Musculoskeletal: Normal range of motion.  Neurological: She is alert. She displays tremor. Coordination abnormal.  Skin: Skin is warm and dry.  Psychiatric: Her affect is blunt. Her speech is delayed. She is slowed. Thought content is not paranoid. Cognition and memory are impaired. She expresses impulsivity. She exhibits a depressed mood. She expresses suicidal ideation. She expresses no homicidal ideation. She expresses suicidal plans. She exhibits abnormal recent memory and abnormal remote memory.    Review of Systems  Constitutional: Negative.   HENT: Negative.   Eyes: Negative.   Respiratory: Negative.   Cardiovascular: Negative.   Gastrointestinal: Negative.   Musculoskeletal: Negative.   Skin: Negative.   Neurological: Positive  for tremors, sensory change and weakness.  Psychiatric/Behavioral: Positive for depression, hallucinations, memory loss, substance abuse and suicidal ideas. The patient is nervous/anxious and has insomnia.     Blood pressure 101/64, pulse 97, temperature 98.2 F (36.8 C), temperature source Oral, resp. rate (!) 24, height 5' 2" (1.575 m), weight 62.1 kg (137 lb), SpO2 100 %.Body mass index is 25.06 kg/m.  General Appearance: Casual  Eye Contact:  Fair  Speech:  Slow  Volume:  Decreased  Mood:  Depressed  Affect:  Congruent and Constricted  Thought Process:  Goal Directed   Orientation:  Full (Time, Place, and Person)  Thought Content:  Rumination and Tangential  Suicidal Thoughts:  Yes.  with intent/plan  Homicidal Thoughts:  No  Memory:  Immediate;   Fair Recent;   Poor Remote;   Fair  Judgement:  Impaired  Insight:  Fair  Psychomotor Activity:  Decreased  Concentration:  Concentration: Poor  Recall:  Poor  Fund of Knowledge:  Fair  Language:  Fair  Akathisia:  No  Handed:  Right  AIMS (if indicated):     Assets:  Housing Resilience Social Support  ADL's:  Impaired  Cognition:  Impaired,  Mild  Sleep:        Treatment Plan Summary: Daily contact with patient to assess and evaluate symptoms and progress in treatment, Medication management and Plan 37 year old woman with severe major depression without definite psychotic features who seriously tried to kill herself today.  Patient has Huntington's disease which is complicating her treatment obviously.  Patient had told me that she took Tylenol as part of her overdose but the acetaminophen level was undetectable.  Once she is medically stabilized in the emergency room I would expect that we will plan to admit her to the psychiatry ward.  For now we will plan to continue her usual psychiatric medication.  We will follow up until we can get her admitted.  Disposition: Recommend psychiatric Inpatient admission when medically cleared. Supportive therapy provided about ongoing stressors.  Alethia Berthold, MD 06/25/2017 6:27 PM

## 2017-06-25 NOTE — ED Notes (Addendum)
Spoke with poison control. Pt took approx 8 wellbutrin, "whole bottle" of melatonin, approx 50 gabapentin, 15 klonapin, 12 effexor, ibuprofen and tylenol. Pt took these between 2 and 3 pm. Per dr. Quentin Cornwall hold the recommended charcoal d/t possibility of intubation. Pt will need monitored for 24 hrs medically and then for psych

## 2017-06-25 NOTE — BH Assessment (Signed)
Assessment Note  Heather King is an 37 y.o. female. Patient presents to [facility] with her husband under IVC due to a drug overdose. Patient endorses suicidal ideation and states she took multiple medications  (Klonopin, Effexor, Ibuprofen, Melatonin, and Wellbutrin). Patient reports her depression is trigger by her Huntington's Disease diagnosis. 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 Patient states " I just woke up in a mood and felt no one needed me here." Patient states she doesn't remember taking the pills. Patient reports her uncle found her lying the bed and all she remembers is him yelling. Patient states her health has started to deteriorate and she has to use a walker and often forgets how to do simple things. Patient endorses  Bipolar Disorder and Anxiety, diagnoses. Patient endorses SI, however denies HI and AVH.   Patient has a court date for 08/18/2017 for driving with suspended tags.   Patient reports she smoked THC yesterday after 15 years to see how it would make her feel.  Patient presented oriented x 4, cooperative with a depressed affect.   Diagnosis: Major Depression Disorder   Past Medical History:  Past Medical History:  Diagnosis Date  . Acute headache   . ADD (attention deficit disorder)   . Anxiety   . Aseptic meningitis   . Bipolar disorder ([place])   . Breast discharge x 6 months   Bilateral c pain  . Cancer (HCC)    Cervical Cancer  . Cervical pain   . Chronic pain syndrome   . Chronic tension type headache    not intractable  . Depression   . GERD (gastroesophageal reflux disease)   . History of cervical dysplasia   . Huntington disease ([place])   . Insomnia due to medical condition   . Loss of memory   . Mild neurocognitive disorder    attention and concentration problemss  . Rectal bleeding   . Rectal pain   . Seizure-like activity [facility])     Past Surgical History:  Procedure Laterality Date  . ABDOMINAL  HYSTERECTOMY  2013  . CHOLECYSTECTOMY  2012  . LEEP  2007    Family History:  Family History  Problem Relation Age of Onset  . Hypertension Mother   . Diabetes Mother   . Mental illness Mother   . Depression Mother   . Cancer Father        lung  . Alcohol abuse Father   . Dementia Maternal Grandmother   . Cancer Maternal Grandmother        breast  . Dementia Maternal Grandfather   . Cancer Maternal Grandfather        Lung  . Diabetes Brother   . ADD / ADHD Brother   . Breast cancer Maternal Aunt 29    Social History:  reports that she has never smoked. She has never used smokeless tobacco. She reports that she drinks alcohol. She reports that she does not use drugs.  Additional Social History:  Alcohol / Drug Use Pain Medications: SEE PTA  Prescriptions: SEE PTA  Over the Counter: SEE PTA  History of alcohol / drug use?: Yes Longest period of sobriety (when/how long): 15 years  Substance #1 Name of Substance 1: THC  1 - Age of First Use: Unknown 1 - Amount (size/oz): Unknown 1 - Frequency: Rarely 1 - Duration: One day  1 - Last Use / Amount: 06/24/2017  CIWA: CIWA-Ar BP: 101/64  Pulse Rate: 97 COWS:    Allergies:  Allergies  Allergen Reactions  . Abilify [Aripiprazole] Other (See Comments)    akathisia  . Gabapentin Rash  . Nifedipine Rash    To cream    Home Medications:  (Not in a hospital admission)  OB/GYN Status:  No LMP recorded. Patient has had a hysterectomy.  General Assessment Data Assessment unable to be completed: (Assessment completed) Location of Assessment: [facility] ED TTS Assessment: In system Is this a Tele or Face-to-Face Assessment?: Face-to-Face Is this an Initial Assessment or a Re-assessment for this encounter?: Initial Assessment Marital status: Married [place] name: Unknown Is patient pregnant?: No Pregnancy Status: No Living Arrangements: Spouse/significant other Can pt return to current living arrangement?: Yes Admission  Status: Involuntary Is patient capable of signing voluntary admission?: Yes Referral Source: Self/Family/Friend Insurance type: No Software engineer Exam ([place]) Medical Exam completed: Yes  Crisis Care Plan Living Arrangements: Spouse/significant other Legal Guardian: Other:(None reported) Name of Therapist: None reported  Education Status Is patient currently in school?: No Is the patient employed, unemployed or receiving disability?: Unemployed  Risk to self with the past 6 months Suicidal Ideation: Yes-Currently Present Has patient been a risk to self within the past 6 months prior to admission? : Yes Suicidal Intent: Yes-Currently Present Has patient had any suicidal intent within the past 6 months prior to admission? : Yes Is patient at risk for suicide?: Yes Suicidal Plan?: Yes-Currently Present Has patient had any suicidal plan within the past 6 months prior to admission? : Yes Specify Current Suicidal Plan: OD on medications Access to Means: Yes Specify Access to Suicidal Means: prescriptions What has been your use of drugs/alcohol within the last 12 months?: THC Previous Attempts/Gestures: No How many times?: 0 Other Self Harm Risks: None reported Triggers for Past Attempts: Other (Comment)(None reported ) Intentional Self Injurious Behavior: None Family Suicide History: No Recent stressful life event(s): Other (Comment)(Huntington's Disease diagnosis) Persecutory voices/beliefs?: No Depression: Yes Depression Symptoms: Isolating, Tearfulness, Feeling worthless/self pity, Loss of interest in usual pleasures Substance abuse history and/or treatment for substance abuse?: Yes Suicide prevention information given to non-admitted patients: Not applicable  Risk to Others within the past 6 months Homicidal Ideation: No Does patient have any lifetime risk of violence toward others beyond the six months prior to admission? : No Thoughts of Harm to  Others: No Current Homicidal Intent: No Current Homicidal Plan: No Access to Homicidal Means: No Identified Victim: None reported History of harm to others?: No Assessment of Violence: None Noted Violent Behavior Description: None reported Does patient have access to weapons?: No Criminal Charges Pending?: Yes Describe Pending Criminal Charges: dwlr not impaired rev, suspended tag Does patient have a court date: Yes Court Date: 08/20/17 Is patient on probation?: Yes  Psychosis Hallucinations: None noted Delusions: None noted  Mental Status Report Appearance/Hygiene: Disheveled, In scrubs Eye Contact: Good Motor Activity: Unremarkable Speech: Soft Level of Consciousness: Alert Mood: Depressed Affect: Depressed Anxiety Level: None Thought Processes: Coherent Judgement: Impaired Orientation: Person, Place, Situation, Appropriate for developmental age, Time Obsessive Compulsive Thoughts/Behaviors: None  Cognitive Functioning Concentration: Good Memory: Recent Impaired, Remote Impaired Is patient IDD: No Is patient DD?: No Insight: Fair Impulse Control: Poor Appetite: Poor Have you had any weight changes? : Gain Amount of the weight change? (lbs): 30 lbs Sleep: Decreased Total Hours of Sleep: 6 Vegetative Symptoms: Staying in bed, Not bathing, Decreased grooming  ADLScreening [facility] Assessment Services) Patient's cognitive ability adequate to safely  complete daily activities?: Yes Patient able to express need for assistance with ADLs?: Yes Independently performs ADLs?: No  Prior Inpatient Therapy Prior Inpatient Therapy: Yes Prior Therapy Dates: 2004 Prior Therapy Facilty/Provider([facility]): [facility]  Reason for Treatment: Depression  Prior Outpatient Therapy Prior Outpatient Therapy: Yes Prior Therapy Dates: 2017 Prior Therapy Facilty/Provider(s): [facility] Reason for Treatment: Depression and Anxiety  Does patient have an ACCT team?: No Does patient have Intensive In-House  Services?  : No Does patient have [facility] services? : No Does patient have P4CC services?: No  ADL Screening (condition at time of admission) Patient's cognitive ability adequate to safely complete daily activities?: Yes Is the patient deaf or have difficulty hearing?: No Does the patient have difficulty seeing, even when wearing glasses/contacts?: No Does the patient have difficulty concentrating, remembering, or making decisions?: Yes Patient able to express need for assistance with ADLs?: Yes Does the patient have difficulty dressing or bathing?: Yes Independently performs ADLs?: No Communication: Independent Dressing (OT): Independent Is this a change from baseline?: Pre-admission baseline Grooming: Independent Feeding: Independent Bathing: Independent Toileting: Independent In/Out Bed: Independent Walks in Home: Needs assistance Is this a change from baseline?: Pre-admission baseline Does the patient have difficulty walking or climbing stairs?: Yes Weakness of Legs: Both Weakness of Arms/Hands: Both  Home Assistive Devices/Equipment Home Assistive Devices/Equipment: Environmental consultant (specify type)  Therapy Consults (therapy consults require a physician order) PT Evaluation Needed: No OT Evalulation Needed: No SLP Evaluation Needed: No Abuse/Neglect Assessment (Assessment to be complete while patient is alone) Abuse/Neglect Assessment Can Be Completed: Yes Physical Abuse: Denies Verbal Abuse: Denies Sexual Abuse: Denies Exploitation of patient/patient's resources: Denies Self-Neglect: Denies Values / Beliefs Cultural Requests During Hospitalization: None Spiritual Requests During Hospitalization: None Consults Spiritual Care Consult Needed: No Social Work Consult Needed: No            Disposition:  Disposition Initial Assessment Completed for this Encounter: Yes Patient referred to: Other (Comment)(pending psych consult )  On Site Evaluation by:   Reviewed with  Physician:    Jodie Echevaria, [place], LCAS-A 06/25/2017 6:17 PM

## 2017-06-25 NOTE — ED Notes (Signed)
Report given to Floor. Patient to be moved to [facility] room 102

## 2017-06-25 NOTE — ED Triage Notes (Signed)
Pt in via [place] with husband, pt suicidal, taking multiple medications which she is unable to recall amount to any.  Pt reports taking Klonopin, Effexor, Ibuprofen, Melatonin, Wellbutrin.  Pt denies any pain.  Pt states, "I have Huntingtons Disease, its just really hard, it makes me really depressed."  Pt A/Ox4, vitals WDL.

## 2017-06-25 NOTE — ED Notes (Signed)
Pt dressed out in burgandy scrubs per ED tech, Colletta Maryland and myself.  Pt clothing and jewelry sent home with pt's husband.

## 2017-06-25 NOTE — ED Notes (Signed)
MD ROBINSON INITIATED IVC PAPERS/RN SUSAN AND ODS SECURITY MADE AWARE/PT PENDING PSYCH CONSULT.

## 2017-06-25 NOTE — ED Provider Notes (Signed)
[facility] Emergency Department Provider Note    First MD Initiated Contact with Patient 06/25/17 1648     (approximate)  I have reviewed the triage vital signs and the nursing notes.   HISTORY  Chief Complaint Suicidal    HPI Heather King is a 37 y.o. female with a history of recent diagnosis of Huntington's disorder presents after polysubstance overdose that occurred sometime between 2 and 3PM.  It was an intentional ingestion.  Patient having thoughts of killing herself and feeling severely depressed due to her diagnosis of Huntington's.  States that she ingested a whole bottle of melatonin, approximately 50 gabapentin, 15 Klonopin, 12 Effexor, a handful of Motrin and a handful of Tylenol.  States that she is starting to feel sleepy no nausea or vomiting after the ingestion.  Past Medical History:  Diagnosis Date  . Acute headache   . ADD (attention deficit disorder)   . Anxiety   . Aseptic meningitis   . Bipolar disorder ([place])   . Breast discharge x 6 months   Bilateral c pain  . Cancer (HCC)    Cervical Cancer  . Cervical pain   . Chronic pain syndrome   . Chronic tension type headache    not intractable  . Depression   . GERD (gastroesophageal reflux disease)   . History of cervical dysplasia   . Huntington disease ([place])   . Insomnia due to medical condition   . Loss of memory   . Mild neurocognitive disorder    attention and concentration problemss  . Rectal bleeding   . Rectal pain   . Seizure-like activity ([place])    Family History  Problem Relation Age of Onset  . Hypertension Mother   . Diabetes Mother   . Mental illness Mother   . Depression Mother   . Cancer Father        lung  . Alcohol abuse Father   . Dementia Maternal Grandmother   . Cancer Maternal Grandmother        breast  . Dementia Maternal Grandfather   . Cancer Maternal Grandfather        Lung  . Diabetes Brother   . ADD / ADHD Brother   . Breast cancer  Maternal Aunt 43   Past Surgical History:  Procedure Laterality Date  . ABDOMINAL HYSTERECTOMY  2013  . CHOLECYSTECTOMY  2012  . LEEP  2007   Patient Active Problem List   Diagnosis Date Noted  . 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 Migraine headache with aura 04/08/2016  . Elevated prolactin level ([place]) 08/17/2015  . Controlled substance agreement signed 08/16/2015  . Anxiety disorder 07/19/2015  . Huntington's disease ([place]) 07/19/2015  . Chronic low back pain 07/19/2015  . Chronic headaches 07/19/2015  . GERD (gastroesophageal reflux disease) 07/19/2015  . ADD (attention deficit disorder) 07/19/2015      Prior to Admission medications   Medication Sig Start Date End Date Taking? Authorizing Provider  amphetamine-dextroamphetamine (ADDERALL) 20 MG tablet Take 1 tablet (20 mg total) by mouth 2 (two) times daily. 06/05/17   Park Liter P, DO  cetirizine (ZYRTEC) 10 MG tablet Take by mouth. 07/31/15 07/30/16  [provider]  clonazePAM (KLONOPIN) 0.5 MG tablet Take 1 tablet (0.5 mg total) by mouth 2 (two) times daily as needed for anxiety. 06/05/17   Johnson, Megan P, DO  esomeprazole (NEXIUM) 40 MG capsule Take 1 capsule (40 mg total) by mouth daily. 10/18/15   Park Liter  P, DO  HYDROcodone-acetaminophen (NORCO) 10-325 MG tablet TAKE 1 TABLET BY MOUTH EVERY 8HRS AS NEEDED FOR PAIN. DO NOT TAKE WITH YOUR VALIUM. 05/19/17   Johnson, Megan P, DO  QUEtiapine (SEROQUEL) 25 MG tablet Take 1 tablet (25 mg total) by mouth at bedtime. 06/05/17   Johnson, Megan P, DO  venlafaxine XR (EFFEXOR-XR) 150 MG 24 hr capsule Take 2 capsules (300 mg total) by mouth daily. 11/03/16   Johnson, Megan P, DO  zolpidem (AMBIEN) 5 MG tablet Take 1 tablet (5 mg total) by mouth at bedtime. 06/05/17   Park Liter P, DO    Allergies Abilify [aripiprazole]; Gabapentin; and Nifedipine    Social History Social History   Tobacco Use  . Smoking status: Never Smoker  . Smokeless tobacco: Never  Used  Substance Use Topics  . Alcohol use: Yes    Comment: On occasion  . Drug use: No    Review of Systems Patient denies headaches, rhinorrhea, blurry vision, numbness, shortness of breath, chest pain, edema, cough, abdominal pain, nausea, vomiting, diarrhea, dysuria, fevers, rashes or hallucinations unless otherwise stated above in HPI. ____________________________________________   PHYSICAL EXAM:  VITAL SIGNS: Vitals:   06/25/17 1619  BP: 122/79  Pulse: 84  Resp: 18  Temp: 98.2 F (36.8 C)  SpO2: 100%    Constitutional: drowsy and tearful but awakens to voice and protecting her airway. Eyes: Conjunctivae are normal.  Head: Atraumatic. Nose: No congestion/rhinnorhea. Mouth/Throat: Mucous membranes are moist.   Neck: No stridor. Painless ROM.  Cardiovascular: Normal rate, regular rhythm. Grossly normal heart sounds.  Good peripheral circulation. Respiratory: Normal respiratory effort.  No retractions. Lungs CTAB. Gastrointestinal: Soft and nontender. No distention. No abdominal bruits. No CVA tenderness. Genitourinary: deferred Musculoskeletal: No lower extremity tenderness nor edema.  No joint effusions. Neurologic:  Normal speech and language. No gross focal neurologic deficits are appreciated. No facial droop, no clonus Skin:  Skin is warm, dry and intact. No rash noted. Psychiatric: tearful and depression ____________________________________________   LABS (all labs ordered are listed, but only abnormal results are displayed)  Results for orders placed or performed during the hospital encounter of 06/25/17 (from the past 24 hour(s))  Comprehensive metabolic panel     Status: Abnormal   Collection Time: 06/25/17  4:37 PM  Result Value Ref Range   Sodium 139 135 - 145 mmol/L   Potassium 3.1 (L) 3.5 - 5.1 mmol/L   Chloride 104 101 - 111 mmol/L   CO2 24 22 - 32 mmol/L   Glucose, Bld 145 (H) 65 - 99 mg/dL   BUN 9 6 - 20 mg/dL   Creatinine, Ser 0.67 0.44 - 1.00  mg/dL   Calcium 8.9 8.9 - 10.3 mg/dL   Total Protein 7.3 6.5 - 8.1 g/dL   Albumin 4.0 3.5 - 5.0 g/dL   AST 23 15 - 41 U/L   ALT 18 14 - 54 U/L   Alkaline Phosphatase 87 38 - 126 U/L   Total Bilirubin 0.2 (L) 0.3 - 1.2 mg/dL   GFR calc non Af Amer >60 >60 mL/min   GFR calc Af Amer >60 >60 mL/min   Anion gap 11 5 - 15  Ethanol     Status: None   Collection Time: 06/25/17  4:37 PM  Result Value Ref Range   Alcohol, Ethyl (B) <32 <99 mg/dL  Salicylate level     Status: None   Collection Time: 06/25/17  4:37 PM  Result Value Ref Range   Salicylate Lvl <  7.0 2.8 - 30.0 mg/dL  Acetaminophen level     Status: Abnormal   Collection Time: 06/25/17  4:37 PM  Result Value Ref Range   Acetaminophen (Tylenol), Serum <10 (L) 10 - 30 ug/mL  cbc     Status: None   Collection Time: 06/25/17  4:37 PM  Result Value Ref Range   WBC 8.2 3.6 - 11.0 K/uL   RBC 4.01 3.80 - 5.20 MIL/uL   Hemoglobin 13.4 12.0 - 16.0 g/dL   HCT 38.5 35.0 - 47.0 %   MCV 95.9 80.0 - 100.0 fL   MCH 33.4 26.0 - 34.0 pg   MCHC 34.8 32.0 - 36.0 g/dL   RDW 13.1 11.5 - 14.5 %   Platelets 376 150 - 440 K/uL  Urine Drug Screen, Qualitative     Status: Abnormal   Collection Time: 06/25/17  6:03 PM  Result Value Ref Range   Tricyclic, Ur Screen NONE DETECTED NONE DETECTED   Amphetamines, Ur Screen NONE DETECTED NONE DETECTED   MDMA (Ecstasy)Ur Screen NONE DETECTED NONE DETECTED   Cocaine Metabolite,Ur [place] POSITIVE (A) NONE DETECTED   Opiate, Ur Screen NONE DETECTED NONE DETECTED   Phencyclidine (PCP) Ur S NONE DETECTED NONE DETECTED   Cannabinoid 50 Ng, Ur [place] NONE DETECTED NONE DETECTED   Barbiturates, Ur Screen NONE DETECTED NONE DETECTED   Benzodiazepine, Ur Scrn NONE DETECTED NONE DETECTED   Methadone Scn, Ur NONE DETECTED NONE DETECTED  Pregnancy, urine POC     Status: None   Collection Time: 06/25/17  6:11 PM  Result Value Ref Range   Preg Test, Ur NEGATIVE NEGATIVE    ____________________________________________  EKG My review and personal interpretation at Time: 16:42   Indication: overdose  Rate: 85  Rhythm: sinus Axis: normal Other: no stemi, normal intervals,   My review and personal interpretation at Time: 19:09   Indication: overdose  Rate: 85  Rhythm: sinus Axis: normal Other: no stemi, normal intervals,  ____________________________________________  RADIOLOGY   ____________________________________________   PROCEDURES  Procedure(s) performed:  .Critical Care Performed by: Merlyn Lot, MD Authorized by: Merlyn Lot, MD   Critical care provider statement:    Critical care time (minutes):  35   Critical care time was exclusive of:  Separately billable procedures and treating other patients   Critical care was necessary to treat or prevent imminent or life-threatening deterioration of the following conditions:  [place]   Critical care was time spent personally by me on the following activities:  Development of treatment plan with patient or surrogate, discussions with consultants, evaluation of patient's response to treatment, examination of patient, obtaining history from patient or surrogate, ordering and performing treatments and interventions, ordering and review of laboratory studies, ordering and review of radiographic studies, pulse oximetry, re-evaluation of patient's condition and review of old charts      Critical Care performed: yes ____________________________________________   INITIAL IMPRESSION / [place] / ED COURSE  Pertinent labs & imaging results that were available during my care of the patient were reviewed by me and considered in my medical decision making (see chart for details).  DDX: Psychosis, delirium, medication effect, noncompliance, polysubstance abuse, Si, Hi, depression   Zakyia Jerilynn Mages Salatino is a 37 y.o. who presents to the ED with intentional overdose as described above.   Patient drowsy and tearful.  Currently protecting her airway.  Blood work will be sent for the above differential.  Patient will be IV seed due to her intentional overdose.  Will  touch base with poison control.  Uncertain amount of Tylenol ingestion therefore will base NAC treatment on Tylenol level at 4 hours.  Clinical Course as of Jun 26 2254  Thu Jun 25, 2017  1720 Tylenol level currently is undetectable but will repeat at the 4-hour level.  Poison control was contacted and did recommend activated charcoal however the patient is becoming increasingly drowsy and I do believe that there is a reasonable risk that the patient will require intubation for airway protection therefore will withhold activated charcoal at this time especially since her ingestion was roughly 2 to 3 hours prior.   [PR]  1917 Also noted to be cocaine positive.  Still easily awakens to voice but still very drowsy.  Currently protecting her airway.   [PR]    Clinical Course User Index [PR] Merlyn Lot, MD     As part of my medical decision making, I reviewed the following data within the [place] notes reviewed and incorporated, Labs reviewed, notes from prior ED visits and [place] Controlled Substance Database   ____________________________________________   FINAL CLINICAL IMPRESSION(S) / ED DIAGNOSES  Final diagnoses:  Suicidal ideation  Intentional acetaminophen overdose, initial encounter ([place])      NEW MEDICATIONS STARTED DURING THIS VISIT:  New Prescriptions   No medications on file     Note:  This document was prepared using Dragon voice recognition software and may include unintentional dictation errors.    Merlyn Lot, MD 06/25/17 2256

## 2017-06-26 ENCOUNTER — Inpatient Hospital Stay
Admission: AD | Admit: 2017-06-26 | Discharge: 2017-06-28 | DRG: 885 | Disposition: A | Discharge status: Home / Self Care | Payer: No Typology Code available for payment source | Attending: Psychiatry | Admitting: Psychiatry

## 2017-06-26 ENCOUNTER — Encounter: Payer: Self-pay | Admitting: Psychiatry

## 2017-06-26 DIAGNOSIS — G10 Huntington's disease: Secondary | ICD-10-CM | POA: Diagnosis present

## 2017-06-26 DIAGNOSIS — F429 Obsessive-compulsive disorder, unspecified: Secondary | ICD-10-CM | POA: Diagnosis present

## 2017-06-26 DIAGNOSIS — T426X2A Poisoning by other antiepileptic and sedative-hypnotic drugs, intentional self-harm, initial encounter: Secondary | ICD-10-CM | POA: Diagnosis present

## 2017-06-26 DIAGNOSIS — F332 Major depressive disorder, recurrent severe without psychotic features: Principal | ICD-10-CM | POA: Diagnosis present

## 2017-06-26 DIAGNOSIS — F909 Attention-deficit hyperactivity disorder, unspecified type: Secondary | ICD-10-CM | POA: Diagnosis present

## 2017-06-26 DIAGNOSIS — Z79899 Other long term (current) drug therapy: Secondary | ICD-10-CM | POA: Diagnosis not present

## 2017-06-26 DIAGNOSIS — G894 Chronic pain syndrome: Secondary | ICD-10-CM | POA: Diagnosis present

## 2017-06-26 DIAGNOSIS — F29 Unspecified psychosis not due to a substance or known physiological condition: Secondary | ICD-10-CM | POA: Diagnosis present

## 2017-06-26 DIAGNOSIS — F1210 Cannabis abuse, uncomplicated: Secondary | ICD-10-CM | POA: Diagnosis present

## 2017-06-26 DIAGNOSIS — T1491XA Suicide attempt, initial encounter: Secondary | ICD-10-CM | POA: Diagnosis present

## 2017-06-26 DIAGNOSIS — G44229 Chronic tension-type headache, not intractable: Secondary | ICD-10-CM | POA: Diagnosis present

## 2017-06-26 DIAGNOSIS — E876 Hypokalemia: Secondary | ICD-10-CM | POA: Diagnosis present

## 2017-06-26 DIAGNOSIS — F4001 Agoraphobia with panic disorder: Secondary | ICD-10-CM | POA: Diagnosis present

## 2017-06-26 DIAGNOSIS — T43292A Poisoning by other antidepressants, intentional self-harm, initial encounter: Secondary | ICD-10-CM | POA: Diagnosis present

## 2017-06-26 DIAGNOSIS — Z8661 Personal history of infections of the central nervous system: Secondary | ICD-10-CM | POA: Diagnosis not present

## 2017-06-26 DIAGNOSIS — K219 Gastro-esophageal reflux disease without esophagitis: Secondary | ICD-10-CM | POA: Diagnosis present

## 2017-06-26 DIAGNOSIS — T391X2A Poisoning by 4-Aminophenol derivatives, intentional self-harm, initial encounter: Secondary | ICD-10-CM | POA: Diagnosis present

## 2017-06-26 DIAGNOSIS — Z915 Personal history of self-harm: Secondary | ICD-10-CM

## 2017-06-26 DIAGNOSIS — G4700 Insomnia, unspecified: Secondary | ICD-10-CM | POA: Diagnosis present

## 2017-06-26 DIAGNOSIS — Z811 Family history of alcohol abuse and dependence: Secondary | ICD-10-CM | POA: Diagnosis not present

## 2017-06-26 DIAGNOSIS — T43592A Poisoning by other antipsychotics and neuroleptics, intentional self-harm, initial encounter: Secondary | ICD-10-CM | POA: Diagnosis present

## 2017-06-26 DIAGNOSIS — Z818 Family history of other mental and behavioral disorders: Secondary | ICD-10-CM | POA: Diagnosis not present

## 2017-06-26 DIAGNOSIS — T50901A Poisoning by unspecified drugs, medicaments and biological substances, accidental (unintentional), initial encounter: Secondary | ICD-10-CM | POA: Diagnosis present

## 2017-06-26 DIAGNOSIS — F1410 Cocaine abuse, uncomplicated: Secondary | ICD-10-CM | POA: Diagnosis present

## 2017-06-26 DIAGNOSIS — G3184 Mild cognitive impairment, so stated: Secondary | ICD-10-CM | POA: Diagnosis present

## 2017-06-26 DIAGNOSIS — F4010 Social phobia, unspecified: Secondary | ICD-10-CM | POA: Diagnosis present

## 2017-06-26 DIAGNOSIS — F988 Other specified behavioral and emotional disorders with onset usually occurring in childhood and adolescence: Secondary | ICD-10-CM | POA: Diagnosis present

## 2017-06-26 LAB — BASIC METABOLIC PANEL
Anion gap: 8 (ref 5–15)
BUN: 8 mg/dL (ref 6–20)
CHLORIDE: 106 mmol/L (ref 101–111)
CO2: 25 mmol/L (ref 22–32)
Calcium: 8 mg/dL — ABNORMAL LOW (ref 8.9–10.3)
Creatinine, Ser: 0.64 mg/dL (ref 0.44–1.00)
GFR calc Af Amer: >60 mL/min (ref >60)
Glucose, Bld: 75 mg/dL (ref 65–99)
POTASSIUM: 3.1 mmol/L — AB (ref 3.5–5.1)
SODIUM: 139 mmol/L (ref 135–145)

## 2017-06-26 LAB — MAGNESIUM: Magnesium: 2 mg/dL (ref 1.7–2.4)

## 2017-06-26 LAB — HEPATIC FUNCTION PANEL
ALT: 14 U/L (ref 14–54)
AST: 20 U/L (ref 15–41)
Albumin: 3.5 g/dL (ref 3.5–5.0)
Alkaline Phosphatase: 67 U/L (ref 38–126)
Bilirubin, Direct: <0.1 mg/dL — ABNORMAL LOW (ref 0.1–0.5)
Total Bilirubin: 0.4 mg/dL (ref 0.3–1.2)
Total Protein: 5.7 g/dL — ABNORMAL LOW (ref 6.5–8.1)

## 2017-06-26 LAB — ACETAMINOPHEN LEVEL
Acetaminophen (Tylenol), Serum: <10 ug/mL — ABNORMAL LOW (ref 10–30)
Acetaminophen (Tylenol), Serum: <10 ug/mL — ABNORMAL LOW (ref 10–30)

## 2017-06-26 MED ORDER — TRAZODONE HCL 100 MG PO TABS: 100.0000 mg | ORAL_TABLET | Freq: Every evening | ORAL | Status: DC | PRN

## 2017-06-26 MED ORDER — CLONAZEPAM 0.5 MG PO TABS
0.5000 mg | ORAL_TABLET | Freq: Two times a day (BID) | ORAL | Status: DC
  Administered 2017-06-26 – 2017-06-28 (×4): 0.5 mg via ORAL
  Filled 2017-06-26 (×4): qty 1

## 2017-06-26 MED ORDER — POTASSIUM CHLORIDE CRYS ER 20 MEQ PO TBCR
40.0000 meq | EXTENDED_RELEASE_TABLET | Freq: Two times a day (BID) | ORAL | Status: DC
  Administered 2017-06-26 – 2017-06-27 (×2): 40 meq via ORAL
  Filled 2017-06-26 (×2): qty 2

## 2017-06-26 MED ORDER — ONDANSETRON HCL 4 MG PO TABS
4.0000 mg | ORAL_TABLET | Freq: Four times a day (QID) | ORAL | Status: DC | PRN
  Filled 2017-06-26: qty 1

## 2017-06-26 MED ORDER — LORATADINE 10 MG PO TABS
10.0000 mg | ORAL_TABLET | Freq: Every day | ORAL | Status: DC
  Administered 2017-06-26 – 2017-06-28 (×3): 10 mg via ORAL
  Filled 2017-06-26 (×3): qty 1

## 2017-06-26 MED ORDER — ALUM & MAG HYDROXIDE-SIMETH 200-200-20 MG/5ML PO SUSP: 30.0000 mL | ORAL | Status: DC | PRN

## 2017-06-26 MED ORDER — ONDANSETRON HCL 4 MG/2ML IJ SOLN: 4.0000 mg | Freq: Four times a day (QID) | INTRAMUSCULAR | Status: DC | PRN

## 2017-06-26 MED ORDER — SENNOSIDES-DOCUSATE SODIUM 8.6-50 MG PO TABS
1.0000 | ORAL_TABLET | Freq: Every evening | ORAL | Status: DC | PRN
  Filled 2017-06-26: qty 1

## 2017-06-26 MED ORDER — PANTOPRAZOLE SODIUM 40 MG PO TBEC
40.0000 mg | DELAYED_RELEASE_TABLET | Freq: Every day | ORAL | Status: DC
  Administered 2017-06-27 – 2017-06-28 (×2): 40 mg via ORAL
  Filled 2017-06-26 (×2): qty 1

## 2017-06-26 MED ORDER — VENLAFAXINE HCL ER 75 MG PO CP24
300.0000 mg | ORAL_CAPSULE | Freq: Every day | ORAL | Status: DC
  Administered 2017-06-27 – 2017-06-28 (×2): 300 mg via ORAL
  Filled 2017-06-26: qty 4
  Filled 2017-06-26: qty 8

## 2017-06-26 MED ORDER — PANTOPRAZOLE SODIUM 40 MG PO TBEC: 40.0000 mg | DELAYED_RELEASE_TABLET | Freq: Every day | ORAL | Status: DC

## 2017-06-26 MED ORDER — CLONAZEPAM 0.5 MG PO TABS: 0.5000 mg | ORAL_TABLET | Freq: Two times a day (BID) | ORAL | Status: DC

## 2017-06-26 MED ORDER — ACETAMINOPHEN 325 MG PO TABS: 650.0000 mg | ORAL_TABLET | Freq: Four times a day (QID) | ORAL | Status: DC | PRN

## 2017-06-26 MED ORDER — HALOPERIDOL LACTATE 5 MG/ML IJ SOLN
1.0000 mg | Freq: Once | INTRAMUSCULAR | Status: AC
  Administered 2017-06-26: 1 mg via INTRAVENOUS
  Filled 2017-06-26: qty 1

## 2017-06-26 MED ORDER — POTASSIUM CHLORIDE CRYS ER 20 MEQ PO TBCR
40.0000 meq | EXTENDED_RELEASE_TABLET | Freq: Two times a day (BID) | ORAL | Status: DC
  Administered 2017-06-26: 10:00:00 40 meq via ORAL
  Filled 2017-06-26: qty 2

## 2017-06-26 MED ORDER — MAGNESIUM HYDROXIDE 400 MG/5ML PO SUSP: 30.0000 mL | Freq: Every day | ORAL | Status: DC | PRN

## 2017-06-26 MED ORDER — VENLAFAXINE HCL ER 75 MG PO CP24: 300.0000 mg | ORAL_CAPSULE | Freq: Every day | ORAL | Status: DC

## 2017-06-26 MED ORDER — BISACODYL 5 MG PO TBEC
5.0000 mg | DELAYED_RELEASE_TABLET | Freq: Every day | ORAL | Status: DC | PRN
Start: 2017-06-26 — End: 2017-06-27
  Filled 2017-06-26: qty 1

## 2017-06-26 NOTE — Consult Note (Signed)
[facility] Consult   Reason for Consult: Consult follow-up for this 37 year old woman with Huntington's disease who came to the hospital after a overdose with suicidal intent Referring Physician: Vianne Bulls Patient Identification: Heather King MRN:  [id number] Principal Diagnosis: Severe recurrent major depression without psychotic features [facility]) Diagnosis:   Patient Active Problem List   Diagnosis Date Noted  . Severe recurrent major depression without psychotic features ([place]) [F33.2] 06/25/2017  . Suicide attempt ([place]) [T14.91XA] 06/25/2017  . Drug overdose [T50.901A] 06/25/2017  . 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 Migraine headache with aura [G43.109] 04/08/2016  . Elevated prolactin level ([place]) [E22.9] 08/17/2015  . Controlled substance agreement signed [Z79.899] 08/16/2015  . Anxiety disorder [F41.9] 07/19/2015  . Huntington's disease ([place]) [G10] 07/19/2015  . Chronic low back pain [M54.5, G89.29] 07/19/2015  . Chronic headaches [R51] 07/19/2015  . GERD (gastroesophageal reflux disease) [K21.9] 07/19/2015  . ADD (attention deficit disorder) [F98.8] 07/19/2015    Total Time spent with patient: 30 minutes  Subjective:   Heather King is a 37 y.o. female patient admitted with "I am still not feeling okay".  HPI: Patient seen chart reviewed.  Patient was seen yesterday in the emergency room.  See previous consult.  37 year old woman came to the hospital after taking an overdose of multiple medications including benzodiazepines and other medicines with clearly stated suicidal intent.  Initially some concern about acetaminophen toxicity but her Tylenol level has remained undetectable.  Patient has been on the medical service for stabilization by the recommendation of poison control.  On interview today the patient says she is still feeling very bad.  Denies acute suicidal intent but still very depressed sad down hopeless.  Has been cooperative with treatment.   She has been able to get up and ambulate around the room.  Past Psychiatric History: Patient has a history of depression as documented previously.  She is receiving outpatient treatment on antidepressant and antianxiety medication.  Risk to Self: Suicidal Ideation: Yes-Currently Present Suicidal Intent: Yes-Currently Present Is patient at risk for suicide?: Yes Suicidal Plan?: Yes-Currently Present Specify Current Suicidal Plan: OD on medications Access to Means: Yes Specify Access to Suicidal Means: prescriptions What has been your use of drugs/alcohol within the last 12 months?: THC How many times?: 0 Other Self Harm Risks: None reported Triggers for Past Attempts: Other (Comment)(None reported ) Intentional Self Injurious Behavior: None Risk to Others: Homicidal Ideation: No Thoughts of Harm to Others: No Current Homicidal Intent: No Current Homicidal Plan: No Access to Homicidal Means: No Identified Victim: None reported History of harm to others?: No Assessment of Violence: None Noted Violent Behavior Description: None reported Does patient have access to weapons?: No Criminal Charges Pending?: Yes Describe Pending Criminal Charges: dwlr not impaired rev, suspended tag Does patient have a court date: Yes Court Date: 08/20/17 Prior Inpatient Therapy: Prior Inpatient Therapy: Yes Prior Therapy Dates: 2004 Prior Therapy Facilty/Provider(s): [facility]  Reason for Treatment: Depression Prior Outpatient Therapy: Prior Outpatient Therapy: Yes Prior Therapy Dates: 2017 Prior Therapy Facilty/Provider(s): [facility] Reason for Treatment: Depression and Anxiety  Does patient have an ACCT team?: No Does patient have Intensive In-House Services?  : No Does patient have [facility] services? : No Does patient have P4CC services?: No  Past Medical History:  Past Medical History:  Diagnosis Date  . Acute headache   . ADD (attention deficit disorder)   . Anxiety   . Aseptic meningitis   .  Bipolar disorder ([place])   . Breast  discharge x 6 months   Bilateral c pain  . Cancer (HCC)    Cervical Cancer  . Cervical pain   . Chronic pain syndrome   . Chronic tension type headache    not intractable  . Depression   . GERD (gastroesophageal reflux disease)   . History of cervical dysplasia   . Huntington disease ([place])   . Insomnia due to medical condition   . Loss of memory   . Mild neurocognitive disorder    attention and concentration problemss  . Rectal bleeding   . Rectal pain   . Seizure-like activity [facility])     Past Surgical History:  Procedure Laterality Date  . ABDOMINAL HYSTERECTOMY  2013  . CHOLECYSTECTOMY  2012  . LEEP  2007   Family History:  Family History  Problem Relation Age of Onset  . Hypertension Mother   . Diabetes Mother   . Mental illness Mother   . Depression Mother   . Cancer Father        lung  . Alcohol abuse Father   . Dementia Maternal Grandmother   . Cancer Maternal Grandmother        breast  . Dementia Maternal Grandfather   . Cancer Maternal Grandfather        Lung  . Diabetes Brother   . ADD / ADHD Brother   . Breast cancer Maternal Aunt 12   Family Psychiatric  History: See previous note Social History:  Social History   Substance and Sexual Activity  Alcohol Use Yes   Comment: On occasion     Social History   Substance and Sexual Activity  Drug Use No    Social History   Socioeconomic History  . Marital status: Married    Spouse name: Not on file  . Number of children: Not on file  . Years of education: Not on file  . Highest education level: Not on file  Occupational History  . Not on file  Social Needs  . Financial resource strain: Not hard at all  . Food insecurity:    Worry: Never true    Inability: Never true  . Transportation needs:    Medical: No    Non-medical: No  Tobacco Use  . Smoking status: Never Smoker  . Smokeless tobacco: Never Used  Substance and Sexual Activity  . Alcohol use: Yes     Comment: On occasion  . Drug use: No  . Sexual activity: Yes    Birth control/protection: Surgical  Lifestyle  . Physical activity:    Days per week: Patient refused    Minutes per session: Patient refused  . Stress: To some extent  Relationships  . Social connections:    Talks on phone: Not on file    Gets together: Not on file    Attends religious service: Not on file    Active member of club or organization: Not on file    Attends meetings of clubs or organizations: Not on file    Relationship status: Not on file  Other Topics Concern  . Not on file  Social History Narrative  . Not on file   Additional Social History:    Allergies:   Allergies  Allergen Reactions  . Abilify [Aripiprazole] Other (See Comments)    akathisia  . Gabapentin Rash  . Nifedipine Rash    To cream    Labs:  Results for orders placed or performed during the hospital encounter of 06/25/17 (from the past 48  hour(s))  Comprehensive metabolic panel     Status: Abnormal   Collection Time: 06/25/17  4:37 PM  Result Value Ref Range   Sodium 139 135 - 145 mmol/L   Potassium 3.1 (L) 3.5 - 5.1 mmol/L   Chloride 104 101 - 111 mmol/L   CO2 24 22 - 32 mmol/L   Glucose, Bld 145 (H) 65 - 99 mg/dL   BUN 9 6 - 20 mg/dL   Creatinine, Ser 0.67 0.44 - 1.00 mg/dL   Calcium 8.9 8.9 - 10.3 mg/dL   Total Protein 7.3 6.5 - 8.1 g/dL   Albumin 4.0 3.5 - 5.0 g/dL   AST 23 15 - 41 U/L   ALT 18 14 - 54 U/L   Alkaline Phosphatase 87 38 - 126 U/L   Total Bilirubin 0.2 (L) 0.3 - 1.2 mg/dL   GFR calc non Af Amer >60 >60 mL/min   GFR calc Af Amer >60 >60 mL/min    Comment: (NOTE) The eGFR has been calculated using the CKD EPI equation. This calculation has not been validated in all clinical situations. eGFR's persistently <60 mL/min signify possible Chronic Kidney Disease.    Anion gap 11 5 - 15    Comment: Performed at [facility], [address] [place], [place] [address]  Ethanol     Status:  None   Collection Time: 06/25/17  4:37 PM  Result Value Ref Range   Alcohol, Ethyl (B) <10 <10 mg/dL    Comment: (NOTE) Lowest detectable limit for serum alcohol is 10 mg/dL. For medical purposes only. Performed at [facility], [address] [place], [place] [address]   Salicylate level     Status: None   Collection Time: 06/25/17  4:37 PM  Result Value Ref Range   Salicylate Lvl <5.7 2.8 - 30.0 mg/dL    Comment: Performed at [facility], [address] [place], [place] [address]  Acetaminophen level     Status: Abnormal   Collection Time: 06/25/17  4:37 PM  Result Value Ref Range   Acetaminophen (Tylenol), Serum <10 (L) 10 - 30 ug/mL    Comment: (NOTE) Therapeutic concentrations vary significantly. A range of 10-30 ug/mL  may be an effective concentration for many patients. However, some  are best treated at concentrations outside of this range. Acetaminophen concentrations >150 ug/mL at 4 hours after ingestion  and >50 ug/mL at 12 hours after ingestion are often associated with  toxic reactions. Performed at [facility], [address] [place], [place] [address]   cbc     Status: None   Collection Time: 06/25/17  4:37 PM  Result Value Ref Range   WBC 8.2 3.6 - 11.0 K/uL   RBC 4.01 3.80 - 5.20 MIL/uL   Hemoglobin 13.4 12.0 - 16.0 g/dL   HCT 38.5 35.0 - 47.0 %   MCV 95.9 80.0 - 100.0 fL   MCH 33.4 26.0 - 34.0 pg   MCHC 34.8 32.0 - 36.0 g/dL   RDW 13.1 11.5 - 14.5 %   Platelets 376 150 - 440 K/uL    Comment: Performed at [facility], [address] [place], [place] [address]  Magnesium     Status: None   Collection Time: 06/25/17  4:37 PM  Result Value Ref Range   Magnesium 2.0 1.7 - 2.4 mg/dL    Comment: Performed at [facility], [address] [place], [place] [address]  Urine Drug Screen, Qualitative     Status:  Abnormal   Collection Time: 06/25/17  6:03 PM  Result Value Ref Range   Tricyclic, Ur Screen  NONE DETECTED NONE DETECTED   Amphetamines, Ur Screen NONE DETECTED NONE DETECTED   MDMA (Ecstasy)Ur Screen NONE DETECTED NONE DETECTED   Cocaine Metabolite,Ur [place] POSITIVE (A) NONE DETECTED   Opiate, Ur Screen NONE DETECTED NONE DETECTED   Phencyclidine (PCP) Ur S NONE DETECTED NONE DETECTED   Cannabinoid 50 Ng, Ur [place] NONE DETECTED NONE DETECTED   Barbiturates, Ur Screen NONE DETECTED NONE DETECTED   Benzodiazepine, Ur Scrn NONE DETECTED NONE DETECTED   Methadone Scn, Ur NONE DETECTED NONE DETECTED    Comment: (NOTE) Tricyclics + metabolites, urine    Cutoff 1000 ng/mL Amphetamines + metabolites, urine  Cutoff 1000 ng/mL MDMA (Ecstasy), urine              Cutoff 500 ng/mL Cocaine Metabolite, urine          Cutoff 300 ng/mL Opiate + metabolites, urine        Cutoff 300 ng/mL Phencyclidine (PCP), urine         Cutoff 25 ng/mL Cannabinoid, urine                 Cutoff 50 ng/mL Barbiturates + metabolites, urine  Cutoff 200 ng/mL Benzodiazepine, urine              Cutoff 200 ng/mL Methadone, urine                   Cutoff 300 ng/mL The urine drug screen provides only a preliminary, unconfirmed analytical test result and should not be used for non-medical purposes. Clinical consideration and professional judgment should be applied to any positive drug screen result due to possible interfering substances. A more specific alternate chemical method must be used in order to obtain a confirmed analytical result. Gas chromatography / mass spectrometry (GC/MS) is the preferred confirmat ory method. Performed at [facility], [address] [place], [place] [address]   Pregnancy, urine POC     Status: None   Collection Time: 06/25/17  6:11 PM  Result Value Ref Range   Preg Test, Ur NEGATIVE NEGATIVE    Comment:        THE SENSITIVITY OF THIS METHODOLOGY IS >24 mIU/mL   Acetaminophen level     Status: Abnormal   Collection Time: 06/25/17  7:21 PM  Result Value Ref Range    Acetaminophen (Tylenol), Serum <10 (L) 10 - 30 ug/mL    Comment: (NOTE) Therapeutic concentrations vary significantly. A range of 10-30 ug/mL  may be an effective concentration for many patients. However, some  are best treated at concentrations outside of this range. Acetaminophen concentrations >150 ug/mL at 4 hours after ingestion  and >50 ug/mL at 12 hours after ingestion are often associated with  toxic reactions. Performed at [facility], [address] [place],  [address]   Basic metabolic panel     Status: Abnormal   Collection Time: 06/26/17  4:54 AM  Result Value Ref Range   Sodium 139 135 - 145 mmol/L   Potassium 3.1 (L) 3.5 - 5.1 mmol/L   Chloride 106 101 - 111 mmol/L   CO2 25 22 - 32 mmol/L   Glucose, Bld 75 65 - 99 mg/dL   BUN 8 6 - 20 mg/dL   Creatinine, Ser 0.64 0.44 - 1.00 mg/dL   Calcium 8.0 (L) 8.9 - 10.3 mg/dL   GFR calc non Af Amer >60 >  60 mL/min   GFR calc Af Amer >60 >60 mL/min    Comment: (NOTE) The eGFR has been calculated using the CKD EPI equation. This calculation has not been validated in all clinical situations. eGFR's persistently <60 mL/min signify possible Chronic Kidney Disease.    Anion gap 8 5 - 15    Comment: Performed at [facility], [address] [place], [place] [address]  Acetaminophen level     Status: Abnormal   Collection Time: 06/26/17  9:42 AM  Result Value Ref Range   Acetaminophen (Tylenol), Serum <10 (L) 10 - 30 ug/mL    Comment: (NOTE) Therapeutic concentrations vary significantly. A range of 10-30 ug/mL  may be an effective concentration for many patients. However, some  are best treated at concentrations outside of this range. Acetaminophen concentrations >150 ug/mL at 4 hours after ingestion  and >50 ug/mL at 12 hours after ingestion are often associated with  toxic reactions. Performed at [facility], [address] [name], [place] [address]   Hepatic function panel      Status: Abnormal   Collection Time: 06/26/17  9:42 AM  Result Value Ref Range   Total Protein 5.7 (L) 6.5 - 8.1 g/dL   Albumin 3.5 3.5 - 5.0 g/dL   AST 20 15 - 41 U/L   ALT 14 14 - 54 U/L   Alkaline Phosphatase 67 38 - 126 U/L   Total Bilirubin 0.4 0.3 - 1.2 mg/dL   Bilirubin, Direct <0.1 (L) 0.1 - 0.5 mg/dL   Indirect Bilirubin NOT CALCULATED 0.3 - 0.9 mg/dL    Comment: Performed at [facility], [address] [place], [place] [address]    Current Facility-Administered Medications  Medication Dose Route Frequency Provider Last Rate Last Dose  . 0.9 %  sodium chloride infusion  250 mL Intravenous PRN Demetrios Loll, MD      . 0.9 %  sodium chloride infusion   Intravenous Continuous Demetrios Loll, MD 125 mL/hr at 06/26/17 1250    . acetaminophen (TYLENOL) tablet 650 mg  650 mg Oral Q6H PRN Demetrios Loll, MD   650 mg at 06/26/17 0020   Or  . acetaminophen (TYLENOL) suppository 650 mg  650 mg Rectal Q6H PRN Demetrios Loll, MD      . albuterol (PROVENTIL) (2.5 MG/3ML) 0.083% nebulizer solution 2.5 mg  2.5 mg Nebulization Q2H PRN Demetrios Loll, MD      . bisacodyl (DULCOLAX) EC tablet 5 mg  5 mg Oral Daily PRN Demetrios Loll, MD      . enoxaparin (LOVENOX) injection 40 mg  40 mg Subcutaneous Q24H Demetrios Loll, MD   40 mg at 06/25/17 2206  . HYDROcodone-acetaminophen (NORCO/VICODIN) 5-325 MG per tablet 1-2 tablet  1-2 tablet Oral Q4H PRN Demetrios Loll, MD   2 tablet at 06/26/17 1518  . loratadine (CLARITIN) tablet 10 mg  10 mg Oral Daily Clapacs, Madie Reno, MD   10 mg at 06/26/17 0913  . ondansetron (ZOFRAN) tablet 4 mg  4 mg Oral Q6H PRN Demetrios Loll, MD       Or  . ondansetron [facility]) injection 4 mg  4 mg Intravenous Q6H PRN Demetrios Loll, MD      . pantoprazole (PROTONIX) EC tablet 40 mg  40 mg Oral Daily Demetrios Loll, MD   40 mg at 06/26/17 0914  . potassium chloride SA (K-DUR,KLOR-CON) CR tablet 40 mEq  40 mEq Oral BID Epifanio Lesches, MD   40 mEq at 06/26/17 0930  .  senna-docusate (Senokot-S) tablet 1  tablet  1 tablet Oral QHS PRN Demetrios Loll, MD      . sodium chloride flush (NS) 0.9 % injection 3 mL  3 mL Intravenous Q12H Demetrios Loll, MD      . sodium chloride flush (NS) 0.9 % injection 3 mL  3 mL Intravenous PRN Demetrios Loll, MD        Musculoskeletal: Strength & Muscle Tone: decreased Gait & Station: unsteady Patient leans: N/A  Psychiatric Specialty Exam: Physical Exam  ROS  Blood pressure 111/76, pulse 93, temperature 97.8 F (36.6 C), temperature source Oral, resp. rate 14, height '5\' 2"'  (1.575 m), weight 62.1 kg (137 lb), SpO2 100 %.Body mass index is 25.06 kg/m.  General Appearance: Casual  Eye Contact:  Minimal  Speech:  Slow  Volume:  Decreased  Mood:  Depressed and Dysphoric  Affect:  Constricted and Depressed  Thought Process:  Goal Directed  Orientation:  Full (Time, Place, and Person)  Thought Content:  Logical  Suicidal Thoughts:  Yes.  without intent/plan  Homicidal Thoughts:  No  Memory:  Immediate;   Fair Recent;   Fair Remote;   Fair  Judgement:  Impaired  Insight:  Shallow  Psychomotor Activity:  Decreased  Concentration:  Concentration: Fair  Recall:  [employer] of Knowledge:  Fair  Language:  Fair  Akathisia:  No  Handed:  Right  AIMS (if indicated):     Assets:  Desire for Improvement Housing Social Support  ADL's:  Impaired  Cognition:  Impaired,  Mild  Sleep:        Treatment Plan Summary: Daily contact with patient to assess and evaluate symptoms and progress in treatment, Medication management and Plan This is a 37 year old woman who made a very serious suicide attempt with multiple symptoms of severe major depression and ongoing extreme stresses.  Patient remains dysphoric downcast and very depressed.  She has been monitored on telemetry at the recommendation of poison control but it looks like she is probably medically cleared at this point.  I will leave that to medicine to make the determination but at this point the patient can be  transferred to the psychiatric ward as soon as she is declared medically stable.  I have spoken with TTS.  I will sign this out to the psychiatrist on call over the weekend.  Orders will be done for readmission downstairs.  No change today to her medication.  I had left her off of the whole benzodiazepine dose but had given her a low dose of the Effexor to start today to prevent any withdrawal.  Disposition: Recommend psychiatric Inpatient admission when medically cleared.  Alethia Berthold, MD 06/26/2017 4:59 PM

## 2017-06-26 NOTE — Plan of Care (Signed)

## 2017-06-26 NOTE — [facility] Suicide Risk Assessment (Signed)
[facility] Admission Suicide Risk Assessment   Nursing information obtained from:    Demographic factors:    Current Mental Status:    Loss Factors:    Historical Factors:    Risk Reduction Factors:     Total Time spent with patient: 1 hour Principal Problem: Severe recurrent major depression without psychotic features [facility]) Diagnosis:   Patient Active Problem List   Diagnosis Date Noted  . Severe recurrent major depression without psychotic features ([place]) [F33.2] 06/25/2017    Priority: High  . Suicide attempt ([place]) [T14.91XA] 06/25/2017  . Drug overdose [T50.901A] 06/25/2017  . 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 Migraine headache with aura [G43.109] 04/08/2016  . Elevated prolactin level ([place]) [E22.9] 08/17/2015  . Controlled substance agreement signed [Z79.899] 08/16/2015  . Anxiety disorder [F41.9] 07/19/2015  . Huntington's disease ([place]) [G10] 07/19/2015  . Chronic low back pain [M54.5, G89.29] 07/19/2015  . Chronic headaches [R51] 07/19/2015  . GERD (gastroesophageal reflux disease) [K21.9] 07/19/2015  . ADD (attention deficit disorder) [F98.8] 07/19/2015   Subjective Data: overdose.  Continued Clinical Symptoms:    The "Alcohol Use Disorders Identification Test", Guidelines for Use in Primary Care, Second Edition.  World Pharmacologist [facility]). Score between 0-7:  no or low risk or alcohol related problems. Score between 8-15:  moderate risk of alcohol related problems. Score between 16-19:  high risk of alcohol related problems. Score 20 or above:  warrants further diagnostic evaluation for alcohol dependence and treatment.   CLINICAL FACTORS:   Depression:   Comorbid alcohol abuse/dependence Impulsivity Insomnia Alcohol/Substance Abuse/Dependencies Previous Psychiatric Diagnoses and Treatments Medical Diagnoses and Treatments/Surgeries   Musculoskeletal: Strength & Muscle Tone: decreased Gait & Station: unsteady Patient leans: N/A  Psychiatric  Specialty Exam: Physical Exam  Nursing note and vitals reviewed. Psychiatric: Her speech is normal and behavior is normal. Thought content normal. Her mood appears anxious. Cognition and memory are impaired. She expresses impulsivity. She exhibits a depressed mood.    Review of Systems  Neurological: Positive for weakness and headaches.  Psychiatric/Behavioral: Positive for depression. The patient is nervous/anxious and has insomnia.   All other systems reviewed and are negative.   Blood pressure 115/81, pulse 85, temperature 98.3 F (36.8 C), temperature source Oral, resp. rate 18, height 5\' 2"  (1.575 m), weight 68 kg (150 lb), SpO2 100 %.Body mass index is 27.44 kg/m.  General Appearance: Casual  Eye Contact:  Good  Speech:  Clear and Coherent  Volume:  Normal  Mood:  Depressed  Affect:  Appropriate  Thought Process:  Goal Directed and Descriptions of Associations: Intact  Orientation:  Full (Time, Place, and Person)  Thought Content:  WDL  Suicidal Thoughts:  No  Homicidal Thoughts:  No  Memory:  Immediate;   Fair Recent;   Fair Remote;   Fair  Judgement:  Impaired  Insight:  Present  Psychomotor Activity:  Psychomotor Retardation  Concentration:  Concentration: Fair and Attention Span: Fair  Recall:  [employer] of Knowledge:  Fair  Language:  Fair  Akathisia:  No  Handed:  Right  AIMS (if indicated):     Assets:  Communication Skills Desire for Improvement Housing Intimacy Resilience Social Support  ADL's:  Intact  Cognition:  WNL  Sleep:         COGNITIVE FEATURES THAT CONTRIBUTE TO RISK:  None    SUICIDE RISK:   Minimal: No identifiable suicidal ideation.  Patients presenting with no risk factors but with morbid ruminations; may be classified as minimal  risk based on the severity of the depressive symptoms  PLAN OF CARE: hospital admission, medication management, substance abuse counseling, discharge planning.  Heather King is a 37 year old female with a  history of Huntington's chorea, depression, anxiety, psychosis and cognitive decline who was transferred from medical floor where she was briefly hospitalized after suicide attempt by overdose on multiple medications including Tylenol.  #Suicidal ideation, resolved -patient is able to contract for safety in the hospital  #Tylenol overdose -level on 5/17 was low  #Mood/anxiety/psychosis -continue Effexor 300 mg daily -continue Trazodone 100 mg nightly -continue Clonazepam 0.5 mg BID -start haldol 2 mg for psychosis  #GERD -Protonix 40 mg daily  #Substance abuse -admits to infrequent use of cannabis and cocaine -minimizes problems and declines treatment  #Hypokaliemia -KCl 40 mEq daily, recheck level  #Labs, metabolic syndrome monitoring -lipid panel shows elevated TG, TSH is slightly elevated, HgbA1C normal -pregnancy test is negative -EKG, reviewed shows NSR and QTc 469  #Disposition -discharge to home with family -follow up with PCP, [place] [facility] and a new psychiatrist  I certify that inpatient services furnished can reasonably be expected to improve the patient's condition.   Orson Slick, MD 06/26/2017, 7:23 PM

## 2017-06-26 NOTE — Progress Notes (Addendum)
Pt given norco for head pain of 8 on 0-10 scale. With reassessment pt. Stated pain remains at a 7 and she is hurting now in her legs and back. MD made aware. No new orders at this time  MD ordered IV fluid D5NS to run. Pt A&O x 4 with no presentation difference from shift assessment. Endo is aware of CBG and ordered fluids.

## 2017-06-26 NOTE — Progress Notes (Signed)
Dr gave verbal orders to transition care to behavioral health. Behavioral notified of bed ready. Report provided to [place], assigned RN for behavioral health. No questions at close of report. MD paged. Still awaiting imput of discharge orders.

## 2017-06-26 NOTE — Progress Notes (Signed)
EKG reviewed from 12 pm today.NO acute conduction change.NO new changes.Marland KitchenLFT WNL.so pt stable from medical stand point to discharge to Psych when bed is avaialble,

## 2017-06-26 NOTE — BH Assessment (Addendum)
Patient is to be admitted to [facility] by Dr. Weber Cooks Attending Physician will be Dr. Wonda Olds.   Patient has been assigned to Room 311, by [place].   Intake Paper Work has been signed and placed on patient chart.  ER staff is aware of the admission:  Ginger: Oncology Unit Secretary   Dr. Cordelia Pen: Oncology Unit MD   Brandy: Patient's Nurse   Genella Rife: Patient Access.

## 2017-06-26 NOTE — Progress Notes (Signed)
Patient discharged to [place] with NT and Regino Schultze, RN. Madlyn Frankel, RN

## 2017-06-26 NOTE — Progress Notes (Signed)
[place] [facility] [place] NAME: Heather King    MR#:  [id number]  DATE OF BIRTH:  18-Nov-1980  SUBJECTIVE: Patient admitted for overdose of Klonopin, Effexor, Tylenol, melatonin, Benadryl, Wellbutrin.  Today she feels alert, awake, oriented.  No nausea or vomiting.  No abdominal pain.  Mentation is clear.  No seizures overnight.  CHIEF COMPLAINT:   Chief Complaint  Patient presents with  . Suicidal  as per pt, she took about 20 tablets of 0.5 mg Klonopin, handful of Effexor each 2300 mg, Tylenol about handful, melatonin, Benadryl 50 mg tablet about handful in order to kill herself secondary to side effects of Huntington's disease.  REVIEW OF SYSTEMS:   ROS CONSTITUTIONAL: No fever, fatigue or weakness.  EYES: No blurred or double vision.  EARS, NOSE, AND THROAT: No tinnitus or ear pain.  RESPIRATORY: No cough, shortness of breath, wheezing or hemoptysis.  CARDIOVASCULAR: No chest pain, orthopnea, edema.  GASTROINTESTINAL: No nausea, vomiting, diarrhea or abdominal pain.  GENITOURINARY: No dysuria, hematuria.  ENDOCRINE: No polyuria, nocturia,  HEMATOLOGY: No anemia, easy bruising or bleeding SKIN: No rash or lesion. MUSCULOSKELETAL: No joint pain or arthritis.   NEUROLOGIC: No tingling, numbness, weakness.  PSYCHIATRY: Anxiety, depression.  But she is not anxious now. DRUG ALLERGIES:   Allergies  Allergen Reactions  . Abilify [Aripiprazole] Other (See Comments)    akathisia  . Gabapentin Rash  . Nifedipine Rash    To cream    VITALS:  Blood pressure 123/84, pulse 98, temperature 98.1 F (36.7 C), temperature source Oral, resp. rate 20, height 5\' 2"  (1.575 m), weight 62.1 kg (137 lb), SpO2 100 %.  PHYSICAL EXAMINATION:  GENERAL:  37 y.o.-year-old patient lying in the bed with no acute distress.  EYES: Pupils equal, round, reactive to . No scleral icterus. Extraocular muscles intact.  HEENT: Head atraumatic, normocephalic.  Oropharynx and nasopharynx clear.  NECK:  Supple, no jugular venous distention. No thyroid enlargement, no tenderness.  LUNGS: Normal breath sounds bilaterally, no wheezing, rales,rhonchi or crepitation. No use of accessory muscles of respiration.  CARDIOVASCULAR: S1, S2 normal. No murmurs, rubs, or gallops.  ABDOMEN: Soft, nontender, nondistended. Bowel sounds present. No organomegaly or mass.  EXTREMITIES: No pedal edema, cyanosis, or clubbing.  NEUROLOGIC: Cranial nerves II through XII are intact. Muscle strength 5/5 in all extremities. Sensation intact. Gait not checked.  PSYCHIATRIC: The patient is alert and oriented x 3.  SKIN: No obvious rash, lesion, or ulcer.    LABORATORY PANEL:   CBC Recent Labs  Lab 06/25/17 1637  WBC 8.2  HGB 13.4  HCT 38.5  PLT 376   ------------------------------------------------------------------------------------------------------------------  Chemistries  Recent Labs  Lab 06/25/17 1637 06/26/17 0454  NA 139 139  K 3.1* 3.1*  CL 104 106  CO2 24 25  GLUCOSE 145* 75  BUN 9 8  CREATININE 0.67 0.64  CALCIUM 8.9 8.0*  MG 2.0  --   AST 23  --   ALT 18  --   ALKPHOS 87  --   BILITOT 0.2*  --    ------------------------------------------------------------------------------------------------------------------  Cardiac Enzymes No results for input(s): TROPONINI in the last 168 hours. ------------------------------------------------------------------------------------------------------------------  RADIOLOGY:  No results found.  EKG:   Orders placed or performed during the hospital encounter of 06/25/17  . EKG 12-Lead  . EKG 12-Lead  . ED EKG  . ED EKG  . EKG 12-Lead  . EKG 12-Lead    ASSESSMENT AND PLAN:  37 year old female  patient with history of Huntington disease with overdose of multiple medicines including Klonopin, Effexor, Tylenol, melatonin, Benadryl, Wellbutrin.  Patient admitted to medical unit for 24-hour observation  for withdrawal seizure, cardiac conduction defect, monitoring of liver functions.  Continue one-to-one sitter, IVC.  Patient still feels suicidal. 2.  Called poison call center and spoke with them they mentioned that initial Tylenol level normal and recommended another LFTs.  The fact that she has no abdominal pain, nausea patient needs observation for 24 hours just to make sure otherwise they have no recommendation about Tylenol ingestion. 3.  Regarding Effexor concern about cardiac conduction problems so the recommended EKG in 24-hour wintertime at 2:30 PM today if the EKG is normal patient can be moved to next level of care BHU. 4.  Hypokalemia: Replace the potassium. #5 Huntington disease, ADHD, depression, anxiety: Patient is really depressed about her disease and not able to do much secondary to her pain, balance problems.   All the records are reviewed and case discussed with Care Management/Social Workerr. Management plans discussed with the patient, family and they are in agreement.  CODE STATUS:full  TOTAL TIME TAKING CARE OF THIS PATIENT: 38 minutes.   Possible discharge later today for inpatient [place] admission.  Than 50% of time spent in counseling, coordination, talking to [facility], reviewing all the medication list with patient, also recall recommended to have another EKG at 2:30 PM that is 24-hour window.  If the EKG is normal at 24-hour window and patient is now to having any abnormal symptoms of nausea or vomiting abdominal pain, with normal LFTs patient can be discharged from medical unit. Epifanio Lesches M.D on 06/26/2017 at 10:20 AM  Between 7am to 6pm - Pager - [phone]  After 6pm go to [email or web address] - password [email or web address] [place] [facility]  Office  [phone]  CC: Primary care physician; Valerie Roys, DO   Note: This dictation was prepared with Dragon dictation along with smaller phrase technology. Any transcriptional errors that  result from this process are unintentional.

## 2017-06-27 DIAGNOSIS — F332 Major depressive disorder, recurrent severe without psychotic features: Principal | ICD-10-CM

## 2017-06-27 LAB — HIV ANTIBODY (ROUTINE TESTING W REFLEX): HIV Screen 4th Generation wRfx: NONREACTIVE

## 2017-06-27 LAB — TSH: TSH: 4.54 u[IU]/mL — ABNORMAL HIGH (ref 0.350–4.500)

## 2017-06-27 LAB — POTASSIUM: Potassium: 4.4 mmol/L (ref 3.5–5.1)

## 2017-06-27 LAB — HEMOGLOBIN A1C
HEMOGLOBIN A1C: 4.6 % — AB (ref 4.8–5.6)
MEAN PLASMA GLUCOSE: 85.32 mg/dL

## 2017-06-27 LAB — LIPID PANEL
CHOLESTEROL: 157 mg/dL (ref 0–200)
HDL: 55 mg/dL (ref >40)
LDL CALC: 54 mg/dL (ref 0–99)
Total CHOL/HDL Ratio: 2.9 RATIO
Triglycerides: 242 mg/dL — ABNORMAL HIGH (ref <150)
VLDL: 48 mg/dL — ABNORMAL HIGH (ref 0–40)

## 2017-06-27 MED ORDER — HALOPERIDOL 1 MG PO TABS
2.0000 mg | ORAL_TABLET | Freq: Once | ORAL | Status: AC
  Administered 2017-06-27: 2 mg via ORAL
  Filled 2017-06-27: qty 2

## 2017-06-27 MED ORDER — ZOLPIDEM TARTRATE 5 MG PO TABS
5.0000 mg | ORAL_TABLET | Freq: Every evening | ORAL | Status: DC | PRN
  Administered 2017-06-27: 5 mg via ORAL
  Filled 2017-06-27: qty 1

## 2017-06-27 MED ORDER — TRAZODONE HCL 100 MG PO TABS: 100.0000 mg | ORAL_TABLET | Freq: Every day | ORAL | Status: DC

## 2017-06-27 MED ORDER — AMPHETAMINE-DEXTROAMPHETAMINE 5 MG PO TABS
20.0000 mg | ORAL_TABLET | Freq: Two times a day (BID) | ORAL | Status: DC
  Administered 2017-06-27 – 2017-06-28 (×2): 20 mg via ORAL
  Filled 2017-06-27 (×2): qty 4

## 2017-06-27 NOTE — Plan of Care (Signed)
Newly admitted, sad, depressed, isolative in room

## 2017-06-27 NOTE — Progress Notes (Signed)
Patient ID: Heather King, female   DOB: [birth year], 37 y.o.   MRN: [id number] Patient presents with increased depression and suicidal thoughts. Recently attempted suicide by taking a lot of pills. Patient reports multiple stressors including medical problems: was recently diagnosed with Huntington Disease and has not ben able to cope. Reports that she has family support including spouse. Currently contracts for safety. Admitted to the unit and safety precautions initiated.

## 2017-06-27 NOTE — Plan of Care (Signed)
Patient is alert and oriented X 4. Patient denies SI, HI and AVH. Patient rates depression 7/10 and hopelessness 6/10 and anxiety 10/10. Patient complains of irritability, agitation and cramping. Patient isolates to room and does not attend group. Patient did not eat breakfast or lunch stating she was not hungry. Patient complains of pain in her legs which produces cramping  and tingling. Patient given klonopin to help with symptoms. Patient affect is depressed but pleasant. Nurse will continue monitor and encourage group participation. Problem: Education: Goal: Utilization of techniques to improve thought processes will improve Outcome: Not Progressing Goal: Knowledge of the prescribed therapeutic regimen will improve Outcome: Not Progressing   Problem: Activity: Goal: Interest or engagement in leisure activities will improve Outcome: Not Progressing   Problem: Coping: Goal: Coping ability will improve Outcome: Not Progressing Goal: Will verbalize feelings Outcome: Not Progressing   Problem: Health Behavior/Discharge Planning: Goal: Compliance with therapeutic regimen will improve Outcome: Not Progressing

## 2017-06-27 NOTE — Progress Notes (Signed)
Patient currently in bed sleeping. Remained isolative in room, expressing hopelessness. Reported that mains stressors include being recently diagnosed with Huntington disease and has not been able to accept it. Patient's safety maintained.
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
## 2017-06-27 NOTE — H&P (Signed)
Psychiatric Admission Assessment Adult  Patient Identification: Heather King MRN:  [id number] Date of Evaluation:  06/27/2017 Chief Complaint:  Major Depression Disorder  Principal Diagnosis: Severe recurrent major depression without psychotic features ([place]) Diagnosis:   Patient Active Problem List   Diagnosis Date Noted  . Severe recurrent major depression without psychotic features ([place]) [F33.2] 06/25/2017    Priority: High  . Suicide attempt ([place]) [T14.91XA] 06/25/2017  . Drug overdose [T50.901A] 06/25/2017  . 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 Migraine headache with aura [G43.109] 04/08/2016  . Elevated prolactin level ([place]) [E22.9] 08/17/2015  . Controlled substance agreement signed [Z79.899] 08/16/2015  . Anxiety disorder [F41.9] 07/19/2015  . Huntington's disease ([place]) [G10] 07/19/2015  . Chronic low back pain [M54.5, G89.29] 07/19/2015  . Chronic headaches [R51] 07/19/2015  . GERD (gastroesophageal reflux disease) [K21.9] 07/19/2015  . ADD (attention deficit disorder) [F98.8] 07/19/2015   History of Present Illness:   Identifying data. Heather King is a 37 year old female with a history of Huntington disease, depression, anxiety, psychosis and cognitive decline.  Chief complaint. "I guess I just felt sorry for myself."  History of present illness. Information was obtained from the patient and the chart. The patient was rushed to the ER by her family when they realize that the patient overdosed on medications including Tylenol, Seroquel, Ambien and Wellbutrin. She was briefly hospitalized on medical floor. The patient discloses that she was home alone, feeling sorry for herself, and impulsively overdosed on pills. She was diagnosed with Huntington's two years ago and has been experiencing physical problems with muscle twitching and leg pain and difficulties walking. Her neurologist told her that her illness is progressing faster that they anticipated. The patient has a  long history of depression and anxiety that has worsened over the past two years. She has been taking Effexor for over 10 years and believes that it has not been working well lately. In addition to Effexor, she had been prescribed Adderall for ADHD and Clonazepam for anxiety. Apparently, her PCP who used to prescribe her medications, no longer feels comfortable and referred the patient to Dr. Nicolasa Ducking with first appointment in June. The patient denies substance use but did use some cocaine "for leg pain" couple of weeks ago creating a new problem.  The patient reports many symptoms of depression with poor sleep, decreased appetite, anhedonia, feeling of hopelessness, poor energy and concentration, crying spells. She denies feeling suicidal and overdosed on impulse. There is heightened anxiety with frequent panic attacks and agoraphobia. She is unable to leave the house without Clonazepam. There is some social anxiety and OCD type of symptoms with checking behaviors. She reports hallucinations that come and go. She is embarrassed by them. She noticed cognitive decline with short term memory loss. She is forced to make list and notes.  Past psychiatric history. Long history of depression, anxiety, ADHD and mood instability. She has been tried on multiple medications. At different points Wellbutrin, Seroquel and Abilify were added to the Effexor with little improvement. Seroquel was causing weight gain. She was on Lamictal as well. There were no suicide attempts. There was one brief hospitalization for depression in 2012 at [facility].  Family psychiatric history. Biological father has Huntington's and depression. He is not doing well.    Social history. She lives with her husband who is supportive. She has a 59 year old daughter who chosen not to be tested. She applied for disability and is awaiting court hearing. She has no health insurance.  Total Time spent  with patient: 1 hour  Is the patient at risk to self?  No.  Has the patient been a risk to self in the past 6 months? Yes.    Has the patient been a risk to self within the distant past? No.  Is the patient a risk to others? No.  Has the patient been a risk to others in the past 6 months? No.  Has the patient been a risk to others within the distant past? No.   Prior Inpatient Therapy:   Prior Outpatient Therapy:    Alcohol Screening: Patient refused Alcohol Screening Tool: Yes 1. How often do you have a drink containing alcohol?: Never 2. How many drinks containing alcohol do you have on a typical day when you are drinking?: 1 or 2 3. How often do you have six or more drinks on one occasion?: Never AUDIT-C Score: 0 4. How often during the last year have you found that you were not able to stop drinking once you had started?: Never 5. How often during the last year have you failed to do what was normally expected from you becasue of drinking?: Never 6. How often during the last year have you needed a first drink in the morning to get yourself going after a heavy drinking session?: Never 7. How often during the last year have you had a feeling of guilt of remorse after drinking?: Never 8. How often during the last year have you been unable to remember what happened the night before because you had been drinking?: Never 9. Have you or someone else been injured as a result of your drinking?: No 10. Has a relative or friend or a doctor or another health worker been concerned about your drinking or suggested you cut down?: No Alcohol Use Disorder Identification Test Final Score (AUDIT): 0 Intervention/Follow-up: AUDIT Score <7 follow-up not indicated Substance Abuse History in the last 12 months:  Yes.   Consequences of Substance Abuse: Negative Previous Psychotropic Medications: Yes  Psychological Evaluations: No  Past Medical History:  Past Medical History:  Diagnosis Date  . Acute headache   . ADD (attention deficit disorder)   . Anxiety    . Aseptic meningitis   . Bipolar disorder ([place])   . Breast discharge x 6 months   Bilateral c pain  . Cancer (HCC)    Cervical Cancer  . Cervical pain   . Chronic pain syndrome   . Chronic tension type headache    not intractable  . Depression   . GERD (gastroesophageal reflux disease)   . History of cervical dysplasia   . Huntington disease ([place])   . Insomnia due to medical condition   . Loss of memory   . Mild neurocognitive disorder    attention and concentration problemss  . Rectal bleeding   . Rectal pain   . Seizure-like activity [facility])     Past Surgical History:  Procedure Laterality Date  . ABDOMINAL HYSTERECTOMY  2013  . CHOLECYSTECTOMY  2012  . LEEP  2007   Family History:  Family History  Problem Relation Age of Onset  . Hypertension Mother   . Diabetes Mother   . Mental illness Mother   . Depression Mother   . Cancer Father        lung  . Alcohol abuse Father   . Dementia Maternal Grandmother   . Cancer Maternal Grandmother        breast  . Dementia Maternal Grandfather   . Cancer  Maternal Grandfather        Lung  . Diabetes Brother   . ADD / ADHD Brother   . Breast cancer Maternal Aunt 43   Tobacco Screening: Have you used any form of tobacco in the last 30 days? (Cigarettes, Smokeless Tobacco, Cigars, and/or Pipes): No Social History:  Social History   Substance and Sexual Activity  Alcohol Use Yes   Comment: On occasion     Social History   Substance and Sexual Activity  Drug Use No    Additional Social History:                           Allergies:   Allergies  Allergen Reactions  . Abilify [Aripiprazole] Other (See Comments)    akathisia  . Gabapentin Rash  . Nifedipine Rash    To cream   Lab Results:  Results for orders placed or performed during the hospital encounter of 06/26/17 (from the past 48 hour(s))  Acetaminophen level     Status: Abnormal   Collection Time: 06/26/17  8:21 PM  Result Value Ref Range    Acetaminophen (Tylenol), Serum <10 (L) 10 - 30 ug/mL    Comment: (NOTE) Therapeutic concentrations vary significantly. A range of 10-30 ug/mL  may be an effective concentration for many patients. However, some  are best treated at concentrations outside of this range. Acetaminophen concentrations >150 ug/mL at 4 hours after ingestion  and >50 ug/mL at 12 hours after ingestion are often associated with  toxic reactions. Performed at [facility], [address] [place], [place] [address]   Hemoglobin A1c     Status: Abnormal   Collection Time: 06/27/17  6:59 AM  Result Value Ref Range   Hgb A1c MFr Bld 4.6 (L) 4.8 - 5.6 %    Comment: (NOTE) Pre diabetes:          5.7%-6.4% Diabetes:              >6.4% Glycemic control for   <7.0% adults with diabetes    Mean Plasma Glucose 85.32 mg/dL    Comment: Performed at [facility] [address], [place], [place] [address]  Lipid panel     Status: Abnormal   Collection Time: 06/27/17  6:59 AM  Result Value Ref Range   Cholesterol 157 0 - 200 mg/dL   Triglycerides 242 (H) <150 mg/dL   HDL 55 >40 mg/dL   Total CHOL/HDL Ratio 2.9 RATIO   VLDL 48 (H) 0 - 40 mg/dL   LDL Cholesterol 54 0 - 99 mg/dL    Comment:        Total Cholesterol/HDL:CHD Risk Coronary Heart Disease Risk Table                     Men   Women  1/2 Average Risk   3.4   3.3  Average Risk       5.0   4.4  2 X Average Risk   9.6   7.1  3 X Average Risk  23.4   11.0        Use the calculated Patient Ratio above and the CHD Risk Table to determine the patient's CHD Risk.        ATP III CLASSIFICATION (LDL):  <100     mg/dL   Optimal  100-129  mg/dL   Near or Above  Optimal  130-159  mg/dL   Borderline  160-189  mg/dL   High  >190     mg/dL   Very High Performed at [facility], [address] [place], [place] [address]   TSH     Status: Abnormal   Collection Time: 06/27/17  6:59 AM  Result Value Ref Range   TSH  4.540 (H) 0.350 - 4.500 uIU/mL    Comment: Performed by a 3rd Generation assay with a functional sensitivity of <=0.01 uIU/mL. Performed at [facility], [address] [place], [place] [address]     Blood Alcohol level:  Lab Results  Component Value Date   [facility] <10 97/98/9211    Metabolic Disorder Labs:  Lab Results  Component Value Date   HGBA1C 4.6 (L) 06/27/2017   MPG 85.32 06/27/2017   Lab Results  Component Value Date   PROLACTIN 26.8 (H) 08/16/2015   Lab Results  Component Value Date   CHOL 157 06/27/2017   TRIG 242 (H) 06/27/2017   HDL 55 06/27/2017   CHOLHDL 2.9 06/27/2017   VLDL 48 (H) 06/27/2017   LDLCALC 54 06/27/2017    Current Medications: Current Facility-Administered Medications  Medication Dose Route Frequency Provider Last Rate Last Dose  . acetaminophen (TYLENOL) tablet 650 mg  650 mg Oral Q6H PRN Clapacs, John T, MD      . alum & mag hydroxide-simeth (MAALOX/MYLANTA) 200-200-20 MG/5ML suspension 30 mL  30 mL Oral Q4H PRN Clapacs, John T, MD      . amphetamine-dextroamphetamine (ADDERALL) tablet 20 mg  20 mg Oral BID WC Pucilowska, Jolanta B, MD      . clonazePAM (KLONOPIN) tablet 0.5 mg  0.5 mg Oral BID Clapacs, John T, MD   0.5 mg at 06/27/17 0827  . loratadine (CLARITIN) tablet 10 mg  10 mg Oral Daily Clapacs, Madie Reno, MD   10 mg at 06/27/17 0826  . magnesium hydroxide (MILK OF MAGNESIA) suspension 30 mL  30 mL Oral Daily PRN Clapacs, John T, MD      . pantoprazole (PROTONIX) EC tablet 40 mg  40 mg Oral Daily Clapacs, Madie Reno, MD   40 mg at 06/27/17 0827  . potassium chloride SA (K-DUR,KLOR-CON) CR tablet 40 mEq  40 mEq Oral BID Clapacs, Madie Reno, MD   40 mEq at 06/27/17 0826  . traZODone (DESYREL) tablet 100 mg  100 mg Oral QHS Pucilowska, Jolanta B, MD      . venlafaxine XR (EFFEXOR-XR) 24 hr capsule 300 mg  300 mg Oral Q breakfast Clapacs, John T, MD   300 mg at 06/27/17 9417  . zolpidem (AMBIEN) tablet 5 mg  5 mg Oral QHS PRN Pucilowska,  Jolanta B, MD       PTA Medications: Medications Prior to Admission  Medication Sig Dispense Refill Last Dose  . amphetamine-dextroamphetamine (ADDERALL) 20 MG tablet Take 1 tablet (20 mg total) by mouth 2 (two) times daily. 60 tablet 0   . cetirizine (ZYRTEC) 10 MG tablet Take by mouth.   Taking  . esomeprazole (NEXIUM) 40 MG capsule Take 1 capsule (40 mg total) by mouth daily. 30 capsule 3 Taking  . QUEtiapine (SEROQUEL) 25 MG tablet Take 1 tablet (25 mg total) by mouth at bedtime. 30 tablet 3   . zolpidem (AMBIEN) 5 MG tablet Take 1 tablet (5 mg total) by mouth at bedtime. 30 tablet 3     Musculoskeletal: Strength & Muscle Tone: decreased Gait & Station: unsteady Patient leans: N/A  Psychiatric Specialty Exam: I reviewed physical exam performed on medical floor and agree with the findings. Physical Exam  Nursing note and vitals reviewed. Psychiatric: Her speech is normal and behavior is normal. Thought content normal. Her mood appears anxious. Cognition and memory are impaired. She expresses impulsivity. She exhibits a depressed mood.    Review of Systems  Neurological: Positive for weakness and headaches.  Psychiatric/Behavioral: Positive for depression, hallucinations and substance abuse. The patient is nervous/anxious and has insomnia.   All other systems reviewed and are negative.   Blood pressure 109/72, pulse 84, temperature 98.9 F (37.2 C), temperature source Oral, resp. rate 18, height 5\' 2"  (1.575 m), weight 68 kg (150 lb), SpO2 100 %.Body mass index is 27.44 kg/m.  See SRA                                                  Sleep:  Number of Hours: 7.5    Treatment Plan Summary: Daily contact with patient to assess and evaluate symptoms and progress in treatment and Medication management   Ms. Buhman is a 37 year old female with a history of Huntington's chorea, depression, anxiety, psychosis and cognitive decline who was transferred from medical  floor where she was briefly hospitalized after suicide attempt by overdose on multiple medications including Tylenol.  #Suicidal ideation, resolved -patient is able to contract for safety in the hospital  #Tylenol overdose -level on 5/17 was low  #Mood/anxiety/psychosis -continue Effexor 300 mg daily -continue standing Trazodone 100 mg nightly -offer Ambien 5 mg PRN -continue Clonazepam 0.5 mg BID -start Haldol 2 mg for psychosis  #GERD -Protonix 40 mg daily  #Substance abuse -admits to infrequent use of cannabis and cocaine -minimizes problems and declines treatment  #Hypokaliemia -KCl 40 mEq daily, recheck level  #Labs, metabolic syndrome monitoring -lipid panel shows elevated TG, TSH is slightly elevated, HgbA1C normal -pregnancy test is negative -EKG, reviewed shows NSR and QTc 469  #Disposition -discharge to home with family -follow up with PCP, [place] [facility] and a new psychiatrist   Observation Level/Precautions:  15 minute checks  Laboratory:  CBC Chemistry Profile HCG UA  Psychotherapy:    Medications:    Consultations:    Discharge Concerns:    Estimated LOS:  Other:     Physician Treatment Plan for Primary Diagnosis: Severe recurrent major depression without psychotic features ([place]) Long Term Goal(s): Improvement in symptoms so as ready for discharge  Short Term Goals: Ability to identify changes in lifestyle to reduce recurrence of condition will improve, Ability to verbalize feelings will improve, Ability to disclose and discuss suicidal ideas, Ability to demonstrate self-control will improve, Ability to identify and develop effective coping behaviors will improve, Ability to maintain clinical measurements within normal limits will improve and Ability to identify triggers associated with substance abuse/mental health issues will improve  Physician Treatment Plan for Secondary Diagnosis: Principal Problem:   Severe recurrent major depression without  psychotic features ([place]) Active Problems:   Huntington's disease ([place])   GERD (gastroesophageal reflux disease)   Suicide attempt ([place])   Drug overdose  Long Term Goal(s): Improvement in symptoms so as ready for discharge  Short Term Goals: Ability to identify changes in lifestyle to reduce recurrence of condition will improve, Ability to demonstrate self-control will improve and Ability to identify triggers associated with substance abuse/mental health issues will improve  I certify that inpatient services furnished can reasonably be expected to improve the patient's condition.    Orson Slick, MD 5/18/201911:22 AM

## 2017-06-27 NOTE — BHH Group Notes (Signed)
LCSW Group Therapy Note  06/27/2017 1:15pm  Type of Therapy and Topic:  Group Therapy:  Fears and Unhealthy Coping Skills  Participation Level:  Did Not Attend   Description of Group:  The focus of this group was to discuss some of the prevalent fears that patients experience, and to identify the commonalities among group members.  An exercise was used to initiate the discussion, followed by writing on the white board a group-generated list of unhealthy coping and healthy coping techniques to deal with each fear.    Therapeutic Goals: 1. Patient will identify and describe 3 fears they experience 2. Patient will identify one positive coping strategy for each fear they experience 3. Patient will respond empathically to peers statements regarding fears they experience  Summary of Patient Progress:  The patient was invited to group therapy but did not attend.      Therapeutic Modalities Cognitive Behavioral Therapy Motivational Interviewing  Asiyah Pineau  CUEBAS-COLON, LCSW 06/27/2017 12:54 PM

## 2017-06-27 NOTE — Tx Team (Signed)
Initial Treatment Plan 06/27/2017 1:09 AM Heather King Heather King [id number]    PATIENT STRESSORS: Health problems Loss of physical strength   PATIENT STRENGTHS: Ability for insight Average or above average intelligence Communication skills Motivation for treatment/growth   PATIENT IDENTIFIED PROBLEMS: Depression  Loss of physical strength  Suicidal thoughts                 DISCHARGE CRITERIA:  Improved stabilization in mood, thinking, and/or behavior Motivation to continue treatment in a less acute level of care Verbal commitment to aftercare and medication compliance  PRELIMINARY DISCHARGE PLAN: Attend PHP/IOP Outpatient therapy Participate in family therapy Return to previous living arrangement  PATIENT/FAMILY INVOLVEMENT: This treatment plan has been presented to and reviewed with the patient, Heather King.  The patient  has been given the opportunity to ask questions and make suggestions.  Ronelle Nigh, RN 06/27/2017, 1:09 AM

## 2017-06-28 MED ORDER — VENLAFAXINE HCL ER 150 MG PO CP24: 300.0000 mg | ORAL_CAPSULE | Freq: Every day | ORAL | 1 refills | Status: DC

## 2017-06-28 MED ORDER — ZOLPIDEM TARTRATE 5 MG PO TABS: 5.0000 mg | ORAL_TABLET | Freq: Every day | ORAL | 3 refills | Status: DC

## 2017-06-28 MED ORDER — ESOMEPRAZOLE MAGNESIUM 40 MG PO CPDR: 40.0000 mg | DELAYED_RELEASE_CAPSULE | Freq: Every day | ORAL | 3 refills | Status: DC

## 2017-06-28 MED ORDER — HALOPERIDOL 1 MG PO TABS: 2.0000 mg | ORAL_TABLET | Freq: Every day | ORAL | Status: DC | PRN

## 2017-06-28 MED ORDER — CLONAZEPAM 0.5 MG PO TABS: 0.5000 mg | ORAL_TABLET | Freq: Two times a day (BID) | ORAL | 1 refills | Status: DC

## 2017-06-28 MED ORDER — TRAZODONE HCL 100 MG PO TABS: 100.0000 mg | ORAL_TABLET | Freq: Every evening | ORAL | 1 refills | Status: DC | PRN

## 2017-06-28 MED ORDER — CETIRIZINE HCL 10 MG PO TABS: 10.0000 mg | ORAL_TABLET | Freq: Every day | ORAL | 11 refills | Status: DC

## 2017-06-28 MED ORDER — HALOPERIDOL 2 MG PO TABS: 2.0000 mg | ORAL_TABLET | Freq: Every day | ORAL | 1 refills | Status: DC | PRN

## 2017-06-28 MED ORDER — AMPHETAMINE-DEXTROAMPHETAMINE 20 MG PO TABS: 20.0000 mg | ORAL_TABLET | Freq: Two times a day (BID) | ORAL | 0 refills | Status: DC

## 2017-06-28 NOTE — Plan of Care (Signed)
  Problem: Coping: Goal: Coping ability will improve Outcome: Progressing Goal: Will verbalize feelings Outcome: Progressing  Patient is interacting appropriately with peers, appears less anxious.

## 2017-06-28 NOTE — Progress Notes (Signed)
  [facility] Adult Case Management Discharge Plan :  Will you be returning to the same living situation after discharge:  Yes,  pt is returning home  At discharge, do you have transportation home?: Yes,  husband will pick her up Do you have the ability to pay for your medications: No.  Release of information consent forms completed and in the chart;  Patient's signature needed at discharge.  Patient to Follow up at: Follow-up Information    Chauncey Coad, MD. Call on 06/29/2017.   Specialty:  Psychiatry Why:  CSW instructed pt to call Dr. Nicolasa Ducking to make a follow up appointment. CSW unable to schedule f/u appointment during the weekend.  Contact information: [place] [address] [phone]        [facility]. Call on 06/29/2017.   Why:  CSW advised pt to contact [facility] on 06/29/2017 to verify if they can see the pt w/o insurance. CSW unable to contact facility during the weekend office is closed. CSW will contact office during the week.        Contact information: [place] [address] [phone]           Next level of care provider has access to [place] and Suicide Prevention discussed: Yes,  CSW discussed with pt  Have you used any form of tobacco in the last 30 days? (Cigarettes, Smokeless Tobacco, Cigars, and/or Pipes): No  Has patient been referred to the Quitline?: N/A patient is not a smoker  Patient has been referred for addiction treatment: N/A  Heather King  CUEBAS-COLON, LCSW 06/28/2017, 9:19 AM

## 2017-06-28 NOTE — Progress Notes (Signed)
Received Heather King this AM after breakfast, she was compliant with her medications. She denied all of the psychiatric symptoms and feels safe to be discharge home today. She received her discharge order. The AVS was reviewed and her questions answered. She received her prescriptions.  Her personal belongings were returned and she was discharge with her husband without incident.

## 2017-06-28 NOTE — BHH Counselor (Signed)
CSW faxed med mgt application to the [facility] at [facility].   Cheree Ditto, LCSWA 06/28/2017

## 2017-06-28 NOTE — Progress Notes (Signed)
Patient is alert and oriented x 4, no distress noted, denies SI/HI/AVH, patient's thoughts are organized and coherent. Patient stated " I feel remorseful about my actions that brought me here " Patient rated her depression a 5/10 on a scale of 0-10 and expressed that she was just newly diagnosed with huntington's disease. Patient was   given support and encouraged to attend evening group. Patient attends evening group and interacts appropriately with peers and staff. 15 minutes safety checks maintained, will continue monitor. Marland Kitchen

## 2017-06-28 NOTE — Discharge Summary (Signed)
Physician Discharge Summary Note  Patient:  Heather King is an 37 y.o., female MRN:  [id number] DOB:  1980-10-14 Patient phone:  [phone] (home)  Patient address:   [address] [place] [address],  Total Time spent with patient: 20 minutes plus 15 min on care coordination and documantation  Date of Admission:  06/26/2017 Date of Discharge: 06/28/2017  Reason for Admission:  Overdose  History of Present Illness:   Identifying data. Heather King is a 37 year old female with a history of Huntington disease, depression, anxiety, psychosis and cognitive decline.  Chief complaint. "I guess I just felt sorry for myself."  History of present illness. Information was obtained from the patient and the chart. The patient was rushed to the ER by her family when they realize that the patient overdosed on medications including Tylenol, Seroquel, Ambien and Wellbutrin. She was briefly hospitalized on medical floor. The patient discloses that she was home alone, feeling sorry for herself, and impulsively overdosed on pills. She was diagnosed with Huntington's two years ago and has been experiencing physical problems with muscle twitching and leg pain and difficulties walking. Her neurologist told her that her illness is progressing faster that they anticipated. The patient has a long history of depression and anxiety that has worsened over the past two years. She has been taking Effexor for over 10 years and believes that it has not been working well lately. In addition to Effexor, she had been prescribed Adderall for ADHD and Clonazepam for anxiety. Apparently, her PCP who used to prescribe her medications, no longer feels comfortable and referred the patient to Dr. Nicolasa Ducking with first appointment in June. The patient denies substance use but did use some cocaine "for leg pain" couple of weeks ago creating a new problem.  The patient reports many symptoms of depression with poor sleep, decreased  appetite, anhedonia, feeling of hopelessness, poor energy and concentration, crying spells. She denies feeling suicidal and overdosed on impulse. There is heightened anxiety with frequent panic attacks and agoraphobia. She is unable to leave the house without Clonazepam. There is some social anxiety and OCD type of symptoms with checking behaviors. She reports hallucinations that come and go. She is embarrassed by them. She noticed cognitive decline with short term memory loss. She is forced to make list and notes.  Past psychiatric history. Long history of depression, anxiety, ADHD and mood instability. She has been tried on multiple medications. At different points Wellbutrin, Seroquel and Abilify were added to the Effexor with little improvement. Seroquel was causing weight gain. She was on Lamictal as well. There were no suicide attempts. There was one brief hospitalization for depression in [facility]  Family psychiatric history. Biological father has Huntington's and depression. He is not doing well.    Social history. She lives with her husband who is supportive. She has a 24 year old daughter who chosen not to be tested. She applied for disability and is awaiting court hearing. She has no health insurance.  Principal Problem: Severe recurrent major depression without psychotic features [facility]) Discharge Diagnoses: Patient Active Problem List   Diagnosis Date Noted  . Severe recurrent major depression without psychotic features ([place]) [F33.2] 06/25/2017    Priority: High  . Suicide attempt ([place]) [T14.91XA] 06/25/2017  . Drug overdose [T50.901A] 06/25/2017  . 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 Migraine headache with aura [G43.109] 04/08/2016  . Elevated prolactin level ([place]) [E22.9] 08/17/2015  . Controlled substance agreement signed [Z79.899] 08/16/2015  . Anxiety disorder [  F41.9] 07/19/2015  . Huntington's disease ([place]) [G10] 07/19/2015  . Chronic low back pain [M54.5,  G89.29] 07/19/2015  . Chronic headaches [R51] 07/19/2015  . GERD (gastroesophageal reflux disease) [K21.9] 07/19/2015  . ADD (attention deficit disorder) [F98.8] 07/19/2015    Past Medical History:  Past Medical History:  Diagnosis Date  . Acute headache   . ADD (attention deficit disorder)   . Anxiety   . Aseptic meningitis   . Bipolar disorder ([place])   . Breast discharge x 6 months   Bilateral c pain  . Cancer (HCC)    Cervical Cancer  . Cervical pain   . Chronic pain syndrome   . Chronic tension type headache    not intractable  . Depression   . GERD (gastroesophageal reflux disease)   . History of cervical dysplasia   . Huntington disease ([place])   . Insomnia due to medical condition   . Loss of memory   . Mild neurocognitive disorder    attention and concentration problemss  . Rectal bleeding   . Rectal pain   . Seizure-like activity [facility])     Past Surgical History:  Procedure Laterality Date  . ABDOMINAL HYSTERECTOMY  2013  . CHOLECYSTECTOMY  2012  . LEEP  2007   Family History:  Family History  Problem Relation Age of Onset  . Hypertension Mother   . Diabetes Mother   . Mental illness Mother   . Depression Mother   . Cancer Father        lung  . Alcohol abuse Father   . Dementia Maternal Grandmother   . Cancer Maternal Grandmother        breast  . Dementia Maternal Grandfather   . Cancer Maternal Grandfather        Lung  . Diabetes Brother   . ADD / ADHD Brother   . Breast cancer Maternal Aunt 43   Social History:  Social History   Substance and Sexual Activity  Alcohol Use Yes   Comment: On occasion     Social History   Substance and Sexual Activity  Drug Use No    Social History   Socioeconomic History  . Marital status: Married    Spouse name: Not on file  . Number of children: Not on file  . Years of education: Not on file  . Highest education level: Not on file  Occupational History  . Not on file  Social Needs  . Financial  resource strain: Not hard at all  . Food insecurity:    Worry: Never true    Inability: Never true  . Transportation needs:    Medical: No    Non-medical: No  Tobacco Use  . Smoking status: Never Smoker  . Smokeless tobacco: Never Used  Substance and Sexual Activity  . Alcohol use: Yes    Comment: On occasion  . Drug use: No  . Sexual activity: Not on file  Lifestyle  . Physical activity:    Days per week: Patient refused    Minutes per session: Patient refused  . Stress: To some extent  Relationships  . Social connections:    Talks on phone: Not on file    Gets together: Not on file    Attends religious service: Not on file    Active member of club or organization: Not on file    Attends meetings of clubs or organizations: Not on file    Relationship status: Not on file  Other Topics Concern  .  Not on file  Social History Narrative  . Not on file    Hospital Course:    Ms. Gutzwiller is a 37 year old female with a history of Huntington's chorea, depression, anxiety, psychosis and cognitive decline who was transferred from medical floor where she was briefly hospitalized after suicide attempt by overdose on multiple medications including Tylenol. At the tyime of discharge, the patient is no longer suicidal. She is able to contract for safety. She is forward thinking and optimistic about the future.   #Mood/anxiety/psychosis -continue Effexor 300 mg daily -continue Ambien 5 mg nightly -continue Clonazepam 0.5 mg BID -start Haldol 2 mg PRN for psychosis  #ADHD continue Adderall 20 mg BID  #GERD -Protonix 40 mg daily  #Substance abuse -admits to infrequent use of cannabis and cocaine -minimizes problems and declines treatment  #Hypokaliemia, resolved  #Labs, metabolic syndrome monitoring -lipid panel shows elevated TG, TSH is slightly elevated, HgbA1C normal -pregnancy test is negative -EKG, reviewed shows NSR and QTc 469  #Disposition -discharge to home with  family -follow up with PCP, [place] neurology and a new psychiatrist    Physical Findings: AIMS:  , ,  ,  ,    CIWA:    COWS:     Musculoskeletal: Strength & Muscle Tone: within normal limits Gait & Station: normal Patient leans: N/A  Psychiatric Specialty Exam: Physical Exam  Nursing note and vitals reviewed. Psychiatric: She has a normal mood and affect. Her speech is normal and behavior is normal. Thought content normal. Cognition and memory are normal. She expresses impulsivity.    Review of Systems  Neurological: Negative.   Psychiatric/Behavioral: Negative.   All other systems reviewed and are negative.   Blood pressure (!) 85/39, pulse 99, temperature 97.8 F (36.6 C), temperature source Oral, resp. rate 18, height 5\' 2"  (1.575 m), weight 68 kg (150 lb), SpO2 100 %.Body mass index is 27.44 kg/m.  General Appearance: Casual  Eye Contact:  Good  Speech:  Clear and Coherent  Volume:  Normal  Mood:  Euthymic  Affect:  Appropriate  Thought Process:  Goal Directed and Descriptions of Associations: Intact  Orientation:  Full (Time, Place, and Person)  Thought Content:  WDL  Suicidal Thoughts:  No  Homicidal Thoughts:  No  Memory:  Immediate;   Fair Recent;   Fair Remote;   Fair  Judgement:  Impaired  Insight:  Present  Psychomotor Activity:  Normal  Concentration:  Concentration: Fair and Attention Span: Fair  Recall:  [employer] of Knowledge:  Fair  Language:  Fair  Akathisia:  No  Handed:  Right  AIMS (if indicated):     Assets:  Communication Skills Desire for [place] Talents/Skills  ADL's:  Intact  Cognition:  WNL  Sleep:  Number of Hours: 7     Have you used any form of tobacco in the last 30 days? (Cigarettes, Smokeless Tobacco, Cigars, and/or Pipes): No  Has this patient used any form of tobacco in the last 30 days? (Cigarettes, Smokeless Tobacco, Cigars, and/or Pipes) Yes, No  Blood Alcohol level:   Lab Results  Component Value Date   ETH <10 60/45/4098    Metabolic Disorder Labs:  Lab Results  Component Value Date   HGBA1C 4.6 (L) 06/27/2017   MPG 85.32 06/27/2017   Lab Results  Component Value Date   PROLACTIN 26.8 (H) 08/16/2015   Lab Results  Component Value Date   CHOL 157 06/27/2017   TRIG 242 (H)  06/27/2017   HDL 55 06/27/2017   CHOLHDL 2.9 06/27/2017   VLDL 48 (H) 06/27/2017   LDLCALC 54 06/27/2017    See Psychiatric Specialty Exam and Suicide Risk Assessment completed by Attending Physician prior to discharge.  Discharge destination:  Home  Is patient on multiple antipsychotic therapies at discharge:  No   Has Patient had three or more failed trials of antipsychotic monotherapy by history:  No  Recommended Plan for Multiple Antipsychotic Therapies: NA  Discharge Instructions    Diet - low sodium heart healthy   Complete by:  As directed    Increase activity slowly   Complete by:  As directed      Allergies as of 06/28/2017      Reactions   Abilify [aripiprazole] Other (See Comments)   akathisia   Gabapentin Rash   Nifedipine Rash   To cream      Medication List    STOP taking these medications   QUEtiapine 25 MG tablet Commonly known as:  SEROQUEL     TAKE these medications     Indication  amphetamine-dextroamphetamine 20 MG tablet Commonly known as:  ADDERALL Take 1 tablet (20 mg total) by mouth 2 (two) times daily.  Indication:  Attention Deficit Hyperactivity Disorder   cetirizine 10 MG tablet Commonly known as:  ZYRTEC Take 1 tablet (10 mg total) by mouth daily. What changed:    how much to take  when to take this  Indication:  Hayfever   clonazePAM 0.5 MG tablet Commonly known as:  KLONOPIN Take 1 tablet (0.5 mg total) by mouth 2 (two) times daily.  Indication:  Panic Disorder   esomeprazole 40 MG capsule Commonly known as:  NEXIUM Take 1 capsule (40 mg total) by mouth daily.  Indication:  Gastroesophageal Reflux  Disease   haloperidol 2 MG tablet Commonly known as:  HALDOL Take 1 tablet (2 mg total) by mouth daily as needed for agitation.  Indication:  Huntington's Chorea   traZODone 100 MG tablet Commonly known as:  DESYREL Take 1 tablet (100 mg total) by mouth at bedtime as needed for sleep.  Indication:  Trouble Sleeping   venlafaxine XR 150 MG 24 hr capsule Commonly known as:  EFFEXOR-XR Take 2 capsules (300 mg total) by mouth daily with breakfast.  Indication:  Major Depressive Disorder, Obsessive Compulsive Disorder   zolpidem 5 MG tablet Commonly known as:  AMBIEN Take 1 tablet (5 mg total) by mouth at bedtime.  Indication:  Trouble Sleeping      Follow-up Information    Chauncey Swatzell, MD. Call on 06/29/2017.   Specialty:  Psychiatry Why:  CSW instructed pt to call Dr. Nicolasa Ducking to make a follow up appointment. CSW unable to schedule f/u appointment during the weekend.  Contact information: [place] [address] [phone]        [place]. Call on 06/29/2017.   Why:  CSW advised pt to contact [facility] on 06/29/2017 to verify if they can see the pt w/o insurance. CSW unable to contact facility during the weekend office is closed. CSW will contact office during the week.        Contact information: [place] [address] [phone]           Follow-up recommendations:  Activity:  as tolerated Diet:  low sodium heart healthy Other:  keep follow up appointments  Comments:    Signed: Orson Slick, MD 06/28/2017, 8:49 AM

## 2017-06-28 NOTE — BHH Suicide Risk Assessment (Signed)
[facility] Discharge Suicide Risk Assessment   Principal Problem: Severe recurrent major depression without psychotic features [facility]) Discharge Diagnoses:  Patient Active Problem List   Diagnosis Date Noted  . Severe recurrent major depression without psychotic features ([place]) [F33.2] 06/25/2017    Priority: High  . Suicide attempt ([place]) [T14.91XA] 06/25/2017  . Drug overdose [T50.901A] 06/25/2017  . 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 Migraine headache with aura [G43.109] 04/08/2016  . Elevated prolactin level ([place]) [E22.9] 08/17/2015  . Controlled substance agreement signed [Z79.899] 08/16/2015  . Anxiety disorder [F41.9] 07/19/2015  . Huntington's disease ([place]) [G10] 07/19/2015  . Chronic low back pain [M54.5, G89.29] 07/19/2015  . Chronic headaches [R51] 07/19/2015  . GERD (gastroesophageal reflux disease) [K21.9] 07/19/2015  . ADD (attention deficit disorder) [F98.8] 07/19/2015    Total Time spent with patient: 20 minutes plus 15 min on care coordination and documantation  Musculoskeletal: Strength & Muscle Tone: within normal limits Gait & Station: normal Patient leans: N/A  Psychiatric Specialty Exam: Review of Systems  Neurological: Positive for weakness.  Psychiatric/Behavioral: Negative.   All other systems reviewed and are negative.   Blood pressure (!) 85/39, pulse 99, temperature 97.8 F (36.6 C), temperature source Oral, resp. rate 18, height 5\' 2"  (1.575 m), weight 68 kg (150 lb), SpO2 100 %.Body mass index is 27.44 kg/m.  General Appearance: Casual  Eye Contact::  Good  Speech:  Clear and Coherent409  Volume:  Normal  Mood:  Euthymic  Affect:  Appropriate  Thought Process:  Goal Directed and Descriptions of Associations: Intact  Orientation:  Full (Time, Place, and Person)  Thought Content:  WDL  Suicidal Thoughts:  No  Homicidal Thoughts:  No  Memory:  Immediate;   Fair Recent;   Fair Remote;   Fair  Judgement:  Impaired  Insight:  Present   Psychomotor Activity:  Normal  Concentration:  Fair  Recall:  [employer] of [place]  Language: Fair  Akathisia:  No  Handed:  Right  AIMS (if indicated):     Assets:  Communication Skills Desire for Improvement Housing Resilience Social Support Talents/Skills  Sleep:  Number of Hours: 7  Cognition: WNL  ADL's:  Intact   Mental Status Per Nursing Assessment::   On Admission:     Demographic Factors:  Caucasian and Unemployed  Loss Factors: Decline in physical health  Historical Factors: Impulsivity  Risk Reduction Factors:   Sense of responsibility to family, Living with another person, especially a relative and Positive social support  Continued Clinical Symptoms:  Depression:   Impulsivity Previous Psychiatric Diagnoses and Treatments Medical Diagnoses and Treatments/Surgeries  Cognitive Features That Contribute To Risk:  None    Suicide Risk:  Minimal: No identifiable suicidal ideation.  Patients presenting with no risk factors but with morbid ruminations; may be classified as minimal risk based on the severity of the depressive symptoms  Follow-up Information    Chauncey Lambert, MD. Call on 06/29/2017.   Specialty:  Psychiatry Why:  CSW instructed pt to call Dr. Nicolasa Ducking to make a follow up appointment. CSW unable to schedule f/u appointment during the weekend.  Contact information: [place] [address] [phone]        [place]. Call on 06/29/2017.   Why:  CSW advised pt to contact [facility] on 06/29/2017 to verify if they can see the pt w/o insurance. CSW unable to contact facility during the weekend office is closed. CSW will contact office during the week.  Contact information: [place] [address] [phone]           Plan Of Care/Follow-up recommendations:  Activity:  as tolerated Diet:  regular Other:  keep follow up appointments  Orson Slick,  MD 06/28/2017, 8:37 AM

## 2017-06-29 NOTE — Discharge Summary (Signed)
Heather King, is a 37 y.o. female  DOB [birth year]  MRN [id number].  Admission date:  06/25/2017  Admitting Physician  Demetrios Loll, MD  Discharge Date:  06/26/2017   Primary MD  Valerie Roys, DO  Recommendations for primary care physician for things to follow:   Patient being discharged to behavioral health unit   Admission Diagnosis  Suicidal ideation [R45.851] Intentional acetaminophen overdose, initial encounter [facility]) [T39.1X2A]   Discharge Diagnosis  Suicidal ideation [R45.851] Intentional acetaminophen overdose, initial encounter ([place]) [T39.1X2A]   Principal Problem:   Severe recurrent major depression without psychotic features ([place]) Active Problems:   Huntington's disease ([place])   Suicide attempt [facility])   Drug overdose      Past Medical History:  Diagnosis Date  . Acute headache   . ADD (attention deficit disorder)   . Anxiety   . Aseptic meningitis   . Bipolar disorder ([place])   . Breast discharge x 6 months   Bilateral c pain  . Cancer (HCC)    Cervical Cancer  . Cervical pain   . Chronic pain syndrome   . Chronic tension type headache    not intractable  . Depression   . GERD (gastroesophageal reflux disease)   . History of cervical dysplasia   . Huntington disease ([place])   . Insomnia due to medical condition   . Loss of memory   . Mild neurocognitive disorder    attention and concentration problemss  . Rectal bleeding   . Rectal pain   . Seizure-like activity [facility])     Past Surgical History:  Procedure Laterality Date  . ABDOMINAL HYSTERECTOMY  2013  . CHOLECYSTECTOMY  2012  . LEEP  2007       History of present illness and  Hospital Course:     Kindly see H&P for history of present illness and admission details, please review complete Labs, Consult reports and Test reports for  all details in brief  HPI  from the history and physical done on the day of admission 37 year old female patient with Huntington's disease came in because of overdose of Klonopin, Wellbutrin, Tylenol, Motrin with suicidal intent.  Patient admitted to medical unit for observation due to medication overdose monitor for side effects.  We had IVC and also sitter placed.   Hospital Course   1.  Multiple drug overdose with suicidal intent:as per pt, she took about 20 tablets of 0.5 mg Klonopin, handful of Effexor each 2300 mg, Tylenol about handful, melatonin, Benadryl 50 mg tablet about handful in order to kill herself secondary to side effects of Huntington's disease, called poison control center and spoke to them, initial Tylenol level is less than 10, also had normal LFTs.  [place] did not recommend Mucomyst.  Patient repeat Tylenol level, LFTs stayed within normal range.  Regarding Wellbutrin, Effexor patient monitor on telemetry, initial EKG, follow-up EKG around 24-hour window did not show any changes of cardiac conduction abnormality.  Patient continued on IVC, sitter.  Seen by psychiatry 2.  History of Huntington disease, depression, suicide attempt with multiple drug overdose, patient is made IVC, continue sitter for safety, patient discharged to behavioral health unit. 3.  Hypokalemia: Replaced, potassium improved.       Discharge Condition; stable   Follow UP      Discharge Instructions  and  Discharge Medications      Allergies as of 06/26/2017      Reactions   Abilify [aripiprazole] Other (See Comments)  akathisia   Gabapentin Rash   Nifedipine Rash   To cream      Medication List    STOP taking these medications   clonazePAM 0.5 MG tablet Commonly known as:  KLONOPIN   HYDROcodone-acetaminophen 10-325 MG tablet Commonly known as:  NORCO   venlafaxine XR 150 MG 24 hr capsule Commonly known as:  EFFEXOR-XR         Diet and Activity  recommendation: See Discharge Instructions above   Consults obtained - psychiatry   Major procedures and Radiology Reports - PLEASE review detailed and final reports for all details, in brief -      No results found.  Micro Results    No results found for this or any previous visit (from the past 240 hour(s)).     Today   Subjective:   [name] today stable for discharge to [facility].  Objective:   Blood pressure 111/76, pulse 93, temperature 97.8 F (36.6 C), temperature source Oral, resp. rate 14, height 5\' 2"  (1.575 m), weight 62.1 kg (137 lb), SpO2 100 %.  No intake or output data in the 24 hours ending 06/29/17 1046  Exam Awake Alert, Oriented x 3, No new F.N deficits, depressed [place].AT,PERRAL Supple Neck,No JVD, No cervical lymphadenopathy appriciated.  Symmetrical Chest wall movement, Good air movement bilaterally, CTAB RRR,No Gallops,Rubs or new Murmurs, No Parasternal Heave +ve B.Sounds, Abd Soft, Non tender, No organomegaly appriciated, No rebound -guarding or rigidity. No Cyanosis, Clubbing or edema, No new Rash or bruise  Data Review   CBC w Diff:  Lab Results  Component Value Date   WBC 8.2 06/25/2017   HGB 13.4 06/25/2017   HGB 12.3 12/11/2016   HCT 38.5 06/25/2017   HCT 37.7 12/11/2016   PLT 376 06/25/2017   PLT 309 12/11/2016    CMP:  Lab Results  Component Value Date   NA 139 06/26/2017   NA 140 12/11/2016   K 4.4 06/27/2017   CL 106 06/26/2017   CO2 25 06/26/2017   BUN 8 06/26/2017   BUN 13 12/11/2016   CREATININE 0.64 06/26/2017   PROT 5.7 (L) 06/26/2017   PROT 6.8 12/11/2016   ALBUMIN 3.5 06/26/2017   ALBUMIN 4.5 12/11/2016   BILITOT 0.4 06/26/2017   BILITOT 0.4 12/11/2016   ALKPHOS 67 06/26/2017   AST 20 06/26/2017   ALT 14 06/26/2017  .   Total Time in preparing paper work, data evaluation and todays exam - 35 minutes  Epifanio Lesches M.D on 06/26/2017 at 10:46 AM    Note: This dictation was prepared with Dragon  dictation along with smaller phrase technology. Any transcriptional errors that result from this process are unintentional.

## 2017-07-02 ENCOUNTER — Ambulatory Visit (INDEPENDENT_AMBULATORY_CARE_PROVIDER_SITE_OTHER): Payer: Self-pay | Admitting: Family Medicine

## 2017-07-02 ENCOUNTER — Encounter: Payer: Self-pay | Admitting: Family Medicine

## 2017-07-02 VITALS — BP 102/70 | HR 83 | Temp 98.4°F | Wt 149.6 lb

## 2017-07-02 DIAGNOSIS — T50902A Poisoning by unspecified drugs, medicaments and biological substances, intentional self-harm, initial encounter: Secondary | ICD-10-CM

## 2017-07-02 DIAGNOSIS — F411 Generalized anxiety disorder: Secondary | ICD-10-CM

## 2017-07-02 DIAGNOSIS — T1491XA Suicide attempt, initial encounter: Secondary | ICD-10-CM

## 2017-07-02 DIAGNOSIS — F29 Unspecified psychosis not due to a substance or known physiological condition: Secondary | ICD-10-CM

## 2017-07-02 NOTE — Progress Notes (Signed)
BP 102/70 (BP Location: Left Arm, Patient Position: Sitting, Cuff Size: Normal)   Pulse 83   Temp 98.4 F (36.9 C)   Wt 149 lb 9 oz (67.8 kg)   SpO2 98%   BMI 27.36 kg/m    Subjective:    Patient ID: Heather King, female    DOB: [birth year], 37 y.o.   MRN: [id number]  HPI: Heather King is a 37 y.o. female  Chief Complaint  Patient presents with  . Hospitalization Follow-up   HOSPITAL FOLLOW UP- went to the ER on Friday night with an overdose, impulsively took tylenol, seroquel, ambien and wellbutrin. She was briefly hospitalized on the medical floor. Was feeling sorry for herself, home alone and impulsively OD'd on pills.  Time since discharge: 4 days Hospital/facility: [facility]- Behavioral Diagnosis: Suicidal ideation/Overdose Procedures/tests: labs Consultants: Psychiatry New medications: Haldol 2mg  PRN Discharge instructions:  Follow up here and with psychiatry Status: better  Has not been feeling great because the trazodone makes her really really groggy. Has been taking the haldol and it seems to be helping with the intrusive   Relevant past medical, surgical, family and social history reviewed and updated as indicated. Interim medical history since our last visit reviewed. Allergies and medications reviewed and updated.  Review of Systems  Constitutional: Negative.   Respiratory: Negative.   Cardiovascular: Negative.   Neurological: Negative.   Psychiatric/Behavioral: Negative.     Per HPI unless specifically indicated above     Objective:    BP 102/70 (BP Location: Left Arm, Patient Position: Sitting, Cuff Size: Normal)   Pulse 83   Temp 98.4 F (36.9 C)   Wt 149 lb 9 oz (67.8 kg)   SpO2 98%   BMI 27.36 kg/m   Wt Readings from Last 3 Encounters:  07/02/17 149 lb 9 oz (67.8 kg)  06/25/17 137 lb (62.1 kg)  06/05/17 154 lb 3 oz (69.9 kg)    Physical Exam  Constitutional: She is oriented to person, place, and time. She appears well-developed and  well-nourished. No distress.  HENT:  Head: Normocephalic and atraumatic.  Right Ear: Hearing normal.  Left Ear: Hearing normal.  Nose: Nose normal.  Eyes: Conjunctivae and lids are normal. Right eye exhibits no discharge. Left eye exhibits no discharge. No scleral icterus.  Cardiovascular: Normal rate, regular rhythm, normal heart sounds and intact distal pulses. Exam reveals no gallop and no friction rub.  No murmur heard. Pulmonary/Chest: Effort normal and breath sounds normal. No stridor. No respiratory distress. She has no wheezes. She has no rales. She exhibits no tenderness.  Musculoskeletal: Normal range of motion.  Neurological: She is alert and oriented to person, place, and time.  Skin: Skin is warm, dry and intact. Capillary refill takes less than 2 seconds. No rash noted. She is not diaphoretic. No erythema. No pallor.  Psychiatric: She has a normal mood and affect. Her speech is normal and behavior is normal. Judgment and thought content normal. Cognition and memory are normal.    Results for orders placed or performed during the hospital encounter of 06/25/17  Comprehensive metabolic panel  Result Value Ref Range   Sodium 139 135 - 145 mmol/L   Potassium 3.1 (L) 3.5 - 5.1 mmol/L   Chloride 104 101 - 111 mmol/L   CO2 24 22 - 32 mmol/L   Glucose, Bld 145 (H) 65 - 99 mg/dL   BUN 9 6 - 20 mg/dL   Creatinine, Ser 0.67 0.44 - 1.00 mg/dL  Calcium 8.9 8.9 - 10.3 mg/dL   Total Protein 7.3 6.5 - 8.1 g/dL   Albumin 4.0 3.5 - 5.0 g/dL   AST 23 15 - 41 U/L   ALT 18 14 - 54 U/L   Alkaline Phosphatase 87 38 - 126 U/L   Total Bilirubin 0.2 (L) 0.3 - 1.2 mg/dL   GFR calc non Af Amer >60 >60 mL/min   GFR calc Af Amer >60 >60 mL/min   Anion gap 11 5 - 15  Ethanol  Result Value Ref Range   Alcohol, Ethyl (B) <41 <66 mg/dL  Salicylate level  Result Value Ref Range   Salicylate Lvl <0.6 2.8 - 30.0 mg/dL  Acetaminophen level  Result Value Ref Range   Acetaminophen (Tylenol), Serum  <10 (L) 10 - 30 ug/mL  cbc  Result Value Ref Range   WBC 8.2 3.6 - 11.0 K/uL   RBC 4.01 3.80 - 5.20 MIL/uL   Hemoglobin 13.4 12.0 - 16.0 g/dL   HCT 38.5 35.0 - 47.0 %   MCV 95.9 80.0 - 100.0 fL   MCH 33.4 26.0 - 34.0 pg   MCHC 34.8 32.0 - 36.0 g/dL   RDW 13.1 11.5 - 14.5 %   Platelets 376 150 - 440 K/uL  Urine Drug Screen, Qualitative  Result Value Ref Range   Tricyclic, Ur Screen NONE DETECTED NONE DETECTED   Amphetamines, Ur Screen NONE DETECTED NONE DETECTED   MDMA (Ecstasy)Ur Screen NONE DETECTED NONE DETECTED   Cocaine Metabolite,Ur [place] POSITIVE (A) NONE DETECTED   Opiate, Ur Screen NONE DETECTED NONE DETECTED   Phencyclidine (PCP) Ur S NONE DETECTED NONE DETECTED   Cannabinoid 50 Ng, Ur [place] NONE DETECTED NONE DETECTED   Barbiturates, Ur Screen NONE DETECTED NONE DETECTED   Benzodiazepine, Ur Scrn NONE DETECTED NONE DETECTED   Methadone Scn, Ur NONE DETECTED NONE DETECTED  Acetaminophen level  Result Value Ref Range   Acetaminophen (Tylenol), Serum <10 (L) 10 - 30 ug/mL  HIV antibody (Routine Testing)  Result Value Ref Range   HIV Screen 4th Generation wRfx Non Reactive Non Reactive  Basic metabolic panel  Result Value Ref Range   Sodium 139 135 - 145 mmol/L   Potassium 3.1 (L) 3.5 - 5.1 mmol/L   Chloride 106 101 - 111 mmol/L   CO2 25 22 - 32 mmol/L   Glucose, Bld 75 65 - 99 mg/dL   BUN 8 6 - 20 mg/dL   Creatinine, Ser 0.64 0.44 - 1.00 mg/dL   Calcium 8.0 (L) 8.9 - 10.3 mg/dL   GFR calc non Af Amer >60 >60 mL/min   GFR calc Af Amer >60 >60 mL/min   Anion gap 8 5 - 15  Magnesium  Result Value Ref Range   Magnesium 2.0 1.7 - 2.4 mg/dL  Acetaminophen level  Result Value Ref Range   Acetaminophen (Tylenol), Serum <10 (L) 10 - 30 ug/mL  Hepatic function panel  Result Value Ref Range   Total Protein 5.7 (L) 6.5 - 8.1 g/dL   Albumin 3.5 3.5 - 5.0 g/dL   AST 20 15 - 41 U/L   ALT 14 14 - 54 U/L   Alkaline Phosphatase 67 38 - 126 U/L   Total Bilirubin 0.4 0.3 - 1.2  mg/dL   Bilirubin, Direct <0.1 (L) 0.1 - 0.5 mg/dL   Indirect Bilirubin NOT CALCULATED 0.3 - 0.9 mg/dL  Pregnancy, urine POC  Result Value Ref Range   Preg Test, Ur NEGATIVE NEGATIVE  Assessment & Plan:   Problem List Items Addressed This Visit      Other   Anxiety disorder    Not under good control. Will get her into psychiatry. Await appointment with them.       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 Will get her into psychiatry. Await appointment with them.       Suicide attempt [facility]) - Primary    Discussed with patient that I would like to get her into psychiatry ASAP. I do not usually manage people at least 1 year post suicide attempt. She is aware. She is going to see [facility] in [place]. Call placed to them, confirmed that they do have straight psychiatric care. Referral placed. She was given the number and will call- they will also call her. Warning signs discussed. Continue to monitor closely.       Drug overdose    Discussed with patient that I would like to get her into psychiatry ASAP. I do not usually manage people at least 1 year post suicide attempt. She is aware. She is going to see [facility] in [place]. Call placed to them, confirmed that they do have straight psychiatric care. Referral placed. She was given the number and will call- they will also call her. Warning signs discussed. Continue to monitor closely.           Follow up plan: Return in about 2 weeks (around 07/16/2017) for follow up.

## 2017-07-06 ENCOUNTER — Encounter: Payer: Self-pay | Admitting: Family Medicine

## 2017-07-06 NOTE — Assessment & Plan Note (Signed)
Discussed with patient that I would like to get her into psychiatry ASAP. I do not usually manage people at least 1 year post suicide attempt. She is aware. She is going to see [facility] in [place]. Call placed to them, confirmed that they do have straight psychiatric care. Referral placed. She was given the number and will call- they will also call her. Warning signs discussed. Continue to monitor closely.

## 2017-07-06 NOTE — Assessment & Plan Note (Signed)
Not under good control. Will get her into psychiatry. Await appointment with them.

## 2017-07-06 NOTE — Assessment & Plan Note (Signed)
Doing better on haldol. Will get her into psychiatry. Await appointment with them.

## 2017-07-06 NOTE — Assessment & Plan Note (Signed)
Discussed with patient that I would like to get her into psychiatry ASAP. I do not usually manage people at least 1 year post suicide attempt. She is aware. She is going to see [facility] in [place]. Call placed to them, confirmed that they do have straight psychiatric care. Referral placed. She was given the number and will call- they will also call her. Warning signs discussed. Continue to monitor closely.

## 2017-07-09 ENCOUNTER — Other Ambulatory Visit: Payer: Self-pay | Admitting: Family Medicine

## 2017-07-09 ENCOUNTER — Encounter: Payer: Self-pay | Admitting: Family Medicine

## 2017-07-09 NOTE — Telephone Encounter (Signed)
Copied from [place] [id number]. Topic: Quick Communication - Rx Refill/Question >> Jul 09, 2017  1:49 PM Yvette Rack wrote: Medication: amphetamine-dextroamphetamine (ADDERALL) 20 MG tablet  pt states that she would like for this to be filled one day early because she is going out of town tomorrow (Friday)  Has the patient contacted their pharmacy? Yes.   (Agent: If no, request that the patient contact the pharmacy for the refill.) (Agent: If yes, when and what did the pharmacy advise?)  Preferred Pharmacy (with phone number or street name): [id number] - [place], [place] [address] [phone] (Phone) [phone] (Fax)      Agent: Please be advised that RX refills may take up to 3 business days. We ask that you follow-up with your pharmacy.

## 2017-07-10 NOTE — Telephone Encounter (Signed)
Rx refill request: adderall 20 mg  Last filled: 5/1//19 #60  LOV: 07/02/17  PCP: [place]: verified

## 2017-07-10 NOTE — Telephone Encounter (Signed)
Pharmacy notified.

## 2017-07-11 NOTE — Telephone Encounter (Signed)
Please advise 

## 2017-07-14 ENCOUNTER — Encounter: Payer: Self-pay | Admitting: Emergency Medicine

## 2017-07-14 ENCOUNTER — Emergency Department
Admission: EM | Admit: 2017-07-14 | Discharge: 2017-07-14 | Disposition: A | Discharge status: Home / Self Care | Payer: Medicaid Other | Attending: Student in an Organized Health Care Education/Training Program | Admitting: Student in an Organized Health Care Education/Training Program

## 2017-07-14 ENCOUNTER — Other Ambulatory Visit: Payer: Self-pay

## 2017-07-14 ENCOUNTER — Ambulatory Visit: Payer: Medicaid Other | Admitting: Family Medicine

## 2017-07-14 ENCOUNTER — Emergency Department: Payer: Medicaid Other

## 2017-07-14 DIAGNOSIS — F22 Delusional disorders: Secondary | ICD-10-CM | POA: Insufficient documentation

## 2017-07-14 DIAGNOSIS — Y999 Unspecified external cause status: Secondary | ICD-10-CM | POA: Insufficient documentation

## 2017-07-14 DIAGNOSIS — Y92009 Unspecified place in unspecified non-institutional (private) residence as the place of occurrence of the external cause: Secondary | ICD-10-CM | POA: Insufficient documentation

## 2017-07-14 DIAGNOSIS — W228XXA Striking against or struck by other objects, initial encounter: Secondary | ICD-10-CM | POA: Insufficient documentation

## 2017-07-14 DIAGNOSIS — F0280 Dementia in other diseases classified elsewhere without behavioral disturbance: Secondary | ICD-10-CM | POA: Insufficient documentation

## 2017-07-14 DIAGNOSIS — G10 Huntington's disease: Secondary | ICD-10-CM | POA: Diagnosis present

## 2017-07-14 DIAGNOSIS — Y9389 Activity, other specified: Secondary | ICD-10-CM | POA: Insufficient documentation

## 2017-07-14 DIAGNOSIS — R4182 Altered mental status, unspecified: Secondary | ICD-10-CM

## 2017-07-14 DIAGNOSIS — S0990XA Unspecified injury of head, initial encounter: Secondary | ICD-10-CM | POA: Insufficient documentation

## 2017-07-14 DIAGNOSIS — Z79899 Other long term (current) drug therapy: Secondary | ICD-10-CM | POA: Insufficient documentation

## 2017-07-14 LAB — URINE DRUG SCREEN, QUALITATIVE (ARMC ONLY)
Amphetamines, Ur Screen: NOT DETECTED
BARBITURATES, UR SCREEN: NOT DETECTED
Benzodiazepine, Ur Scrn: NOT DETECTED
CANNABINOID 50 NG, UR ~~LOC~~: NOT DETECTED
COCAINE METABOLITE, UR ~~LOC~~: NOT DETECTED
MDMA (Ecstasy)Ur Screen: NOT DETECTED
Methadone Scn, Ur: NOT DETECTED
Opiate, Ur Screen: POSITIVE — AB
Phencyclidine (PCP) Ur S: NOT DETECTED
TRICYCLIC, UR SCREEN: NOT DETECTED

## 2017-07-14 LAB — COMPREHENSIVE METABOLIC PANEL
ALT: 24 U/L (ref 14–54)
AST: 30 U/L (ref 15–41)
Albumin: 4.4 g/dL (ref 3.5–5.0)
Alkaline Phosphatase: 73 U/L (ref 38–126)
Anion gap: 11 (ref 5–15)
BUN: 8 mg/dL (ref 6–20)
CALCIUM: 9.5 mg/dL (ref 8.9–10.3)
CO2: 25 mmol/L (ref 22–32)
CREATININE: 0.64 mg/dL (ref 0.44–1.00)
Chloride: 106 mmol/L (ref 101–111)
GFR calc non Af Amer: >60 mL/min (ref >60)
Glucose, Bld: 96 mg/dL (ref 65–99)
Potassium: 3.6 mmol/L (ref 3.5–5.1)
Sodium: 142 mmol/L (ref 135–145)
Total Bilirubin: 0.2 mg/dL — ABNORMAL LOW (ref 0.3–1.2)
Total Protein: 7.5 g/dL (ref 6.5–8.1)

## 2017-07-14 LAB — CBC
HCT: 37.2 % (ref 35.0–47.0)
HEMOGLOBIN: 13.1 g/dL (ref 12.0–16.0)
MCH: 33.2 pg (ref 26.0–34.0)
MCHC: 35.2 g/dL (ref 32.0–36.0)
MCV: 94.6 fL (ref 80.0–100.0)
Platelets: 302 10*3/uL (ref 150–440)
RBC: 3.94 MIL/uL (ref 3.80–5.20)
RDW: 12.8 % (ref 11.5–14.5)
WBC: 9.2 10*3/uL (ref 3.6–11.0)

## 2017-07-14 LAB — SALICYLATE LEVEL: SALICYLATE LVL: 8.2 mg/dL (ref 2.8–30.0)

## 2017-07-14 LAB — ACETAMINOPHEN LEVEL: Acetaminophen (Tylenol), Serum: 26 ug/mL (ref 10–30)

## 2017-07-14 LAB — POCT PREGNANCY, URINE: Preg Test, Ur: NEGATIVE

## 2017-07-14 LAB — ETHANOL

## 2017-07-14 MED ORDER — BUTALBITAL-APAP-CAFFEINE 50-325-40 MG PO TABS
1.0000 | ORAL_TABLET | Freq: Four times a day (QID) | ORAL | Status: DC | PRN
  Administered 2017-07-14: 1 via ORAL
  Filled 2017-07-14: qty 1

## 2017-07-14 NOTE — Consult Note (Signed)
Psychiatry: This is an update to the previous consult note.  I got to speak to the patient again and also spoke to her husband in person.  The patient is begging not to be admitted to the psychiatric unit making the point that treatment we have to provide down there is not likely to provide long-term benefit and that she dislikes it.  Patient is adamant that she has no intention of harming herself and she also says she is willing to be looked after by her family more closely such that there will be someone with her 24 hours a day.  I spoke with her husband who confirms this plan that between the mother the uncle and the husband and the daughter they are planning to take care of the patient on around-the-clock basis to try and keep her safe.  I have repeated to both of them that she is at high risk for continued episodes of blacking out with potentially dangerous behavior.  I have suggested that she needs constant care and probably is at the point of needing facility level care rather than home care.  Nevertheless patient at this point does not meet commitment criteria and can be discharged home given that she and the family understand the risks and there is no further active treatment to be provided in the ER.  Case reviewed with TTS and emergency room physician.  IVC discontinued.

## 2017-07-14 NOTE — ED Notes (Signed)
Pt. Alert and oriented, warm and dry, in no distress. Pt. Denies SI, HI, and AVH. Husband is here in ED to pick up patient. Pt. Encouraged to let nursing staff know of any concerns or needs.
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
## 2017-07-14 NOTE — ED Notes (Signed)
UNCLE RANDY SUMNER CONCERNED ABOUT PT"S HUNTINGTON DISEASE AND WOULD LIKE TO SPEAK TO THE DR. ABOUT INCIDENTS THAT HAVE BEEN OCCURRING OVER THE PAST FEW DAYS. PASSWORD GIVEN TO FAMILY AT PT'S REQUEST.   UNCLE SITTING IN LOBBY. INFORMED HIM THAT DR. MAY OR MAY NOT WANT TO SPEAK TO FAMILY.  RANDY'S PHONE NUMBER IS ON CHART UNDER DEMOGRAPHICS.

## 2017-07-14 NOTE — ED Provider Notes (Signed)
ED ECG REPORT I, Delman Kitten, the attending physician, personally viewed and interpreted this ECG.  Date: 07/14/2017 EKG Time: 1600 Rate: 75 Rhythm: normal sinus rhythm QRS Axis: normal Intervals: normal ST/T Wave abnormalities: normal Narrative Interpretation: no evidence of acute ischemia    Delman Kitten, MD 07/14/17 1642

## 2017-07-14 NOTE — ED Notes (Signed)
Pt has abrasion noted to forehead, bruising appears to be old at this time.

## 2017-07-14 NOTE — ED Notes (Signed)
Patient in bed with eyes closed, NAD observed and will continue to monitor
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
## 2017-07-14 NOTE — ED Notes (Signed)
PT  IVC  SEEN  BY  DR  CLAPACS  PENDING  PLACEMENT 
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
## 2017-07-14 NOTE — ED Notes (Signed)
MD is currently at her bedside

## 2017-07-14 NOTE — ED Notes (Signed)
IVC  PAPERS  RESCINDED  PER  DR CLAPACS  INFORMED  RN AMY TEAGUE  AND ODS OFFICER AND DR Jacqualine Code MD

## 2017-07-14 NOTE — ED Notes (Signed)
Head pain reassessed and patient said she felt the medication worked and said she was not having any pain

## 2017-07-14 NOTE — ED Provider Notes (Signed)
   Dr. Weber Cooks has updated plan of care. Taking off IVC. Family supporting her and Dr. Weber Cooks advises discharge now with PCP follow-up.   Patient is presently resting comfortably.  Family will be taking her home.  She was offered admission to psychiatric team and potential need for further services beyond the capability of her home, but patient and family comfortable with plan for her to return to home and will provide 24-hour care.  IVC removed by psych MD.   Delman Kitten, MD 07/14/17 [phone]

## 2017-07-14 NOTE — ED Notes (Signed)
Dr. Weber Cooks talking with Patient.

## 2017-07-14 NOTE — BH Assessment (Signed)
Per Dr. Prescott Gum patient will be discharged home. Notified Office manager.

## 2017-07-14 NOTE — ED Notes (Signed)

## 2017-07-14 NOTE — ED Notes (Signed)
Pt VOLUNTARY
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
## 2017-07-14 NOTE — ED Notes (Signed)
Medication given to patient, for head pain 6/10, ordered by MD

## 2017-07-31 ENCOUNTER — Telehealth: Payer: Self-pay | Admitting: Family Medicine

## 2017-07-31 ENCOUNTER — Encounter: Payer: Self-pay | Admitting: Family Medicine

## 2017-07-31 DIAGNOSIS — Z9114 Patient's other noncompliance with medication regimen: Secondary | ICD-10-CM

## 2017-07-31 NOTE — Telephone Encounter (Signed)
Copied from [place] [id number]. Topic: Quick Communication - Rx Refill/Question >> Jul 31, 2017  4:22 PM Marin Olp L wrote: Medication: HYDROcodone-acetaminophen (NORCO) 10-325 MG tablet   Has the patient contacted their pharmacy? No. (Agent: If no, request that the patient contact the pharmacy for the refill.) (Agent: If yes, when and what did the pharmacy advise?)  Preferred Pharmacy (with phone number or street name): [id number] [place], [place] [address] [place] [address] Phone: [phone] Fax: [phone]  Agent: Please be advised that RX refills may take up to 3 business days. We ask that you follow-up with your pharmacy.

## 2017-07-31 NOTE — Telephone Encounter (Signed)
We do not write this for that patient any more.

## 2017-08-03 ENCOUNTER — Encounter: Payer: Self-pay | Admitting: Family Medicine

## 2017-08-03 NOTE — Telephone Encounter (Signed)
Dr. Wynetta Emery,  Per Eustaquio Maize Cox do not respond to the patient's message.  I have forwarded the message to the [facility] [employer]Point site for review.  They will follow up and advise next steps.  Thanks,

## 2017-08-19 ENCOUNTER — Ambulatory Visit: Payer: Medicaid Other | Admitting: Pharmacy Technician

## 2017-08-19 DIAGNOSIS — Z79899 Other long term (current) drug therapy: Secondary | ICD-10-CM

## 2017-08-19 NOTE — Progress Notes (Signed)
Completed [facility] application and contract.  Patient agreed to all terms of the [facility] contract.    Patient approved to receive medication assistance at [facility] through 2019, as long as eligibility criteria continues to be met.    Provided patient with community resource material based on her particular needs.    Referred for MTM.  Assisted patient with the completion of the RX Outreach application, so patient can obtain Klonopin and Adderall at a reduced cost.  Patient understood that she needs to have her provider complete physician portion of application and to obtain prescriptions.  Patient understood that it is her responsibility to mail [facility] application along with prescriptions and payment.  Patient also understood that it is her responsibility to contact [facility] and arrange for refills.   [facility]

## 2017-11-02 ENCOUNTER — Other Ambulatory Visit: Payer: Self-pay | Admitting: Psychiatry

## 2017-11-02 MED ORDER — AMPHETAMINE-DEXTROAMPHETAMINE 20 MG PO TABS: 20.0000 mg | ORAL_TABLET | Freq: Two times a day (BID) | ORAL | 0 refills | Status: DC

## 2017-11-02 MED ORDER — CLONAZEPAM 0.5 MG PO TABS: 0.5000 mg | ORAL_TABLET | Freq: Two times a day (BID) | ORAL | 1 refills | Status: DC

## 2017-11-02 MED ORDER — ZOLPIDEM TARTRATE 5 MG PO TABS: 5.0000 mg | ORAL_TABLET | Freq: Every day | ORAL | 3 refills | Status: DC

## 2017-11-17 ENCOUNTER — Ambulatory Visit: Payer: No Typology Code available for payment source | Admitting: Psychiatry

## 2017-12-01 ENCOUNTER — Ambulatory Visit (INDEPENDENT_AMBULATORY_CARE_PROVIDER_SITE_OTHER): Payer: Self-pay | Admitting: Psychiatry

## 2017-12-01 ENCOUNTER — Telehealth (HOSPITAL_COMMUNITY): Payer: Self-pay

## 2017-12-01 ENCOUNTER — Other Ambulatory Visit: Payer: Self-pay | Admitting: Psychiatry

## 2017-12-01 ENCOUNTER — Encounter: Payer: Self-pay | Admitting: Psychiatry

## 2017-12-01 VITALS — BP 115/80 | HR 102 | Ht 62.0 in | Wt 159.0 lb

## 2017-12-01 DIAGNOSIS — G10 Huntington's disease: Secondary | ICD-10-CM

## 2017-12-01 MED ORDER — VENLAFAXINE HCL ER 150 MG PO CP24: 300.0000 mg | ORAL_CAPSULE | Freq: Every day | ORAL | 2 refills | Status: DC

## 2017-12-01 MED ORDER — AMPHETAMINE-DEXTROAMPHETAMINE 20 MG PO TABS: 20.0000 mg | ORAL_TABLET | Freq: Two times a day (BID) | ORAL | 0 refills | Status: DC

## 2017-12-01 MED ORDER — TRAZODONE HCL 100 MG PO TABS: 100.0000 mg | ORAL_TABLET | Freq: Every evening | ORAL | 1 refills | Status: DC | PRN

## 2017-12-01 MED ORDER — ZOLPIDEM TARTRATE 5 MG PO TABS: 5.0000 mg | ORAL_TABLET | Freq: Every day | ORAL | 3 refills | Status: DC

## 2017-12-01 MED ORDER — CLONAZEPAM 0.5 MG PO TABS: 0.5000 mg | ORAL_TABLET | Freq: Three times a day (TID) | ORAL | 2 refills | Status: DC

## 2017-12-01 NOTE — Telephone Encounter (Signed)
Oh.  Wish she had clarified that during the appointment.  I guess I can go back and change them all although now she will have duplicate prescriptions because I sent everything to the [facility] after talking with her today

## 2017-12-01 NOTE — Progress Notes (Signed)
This is a new intake evaluation for this 37 year old woman with known Huntington's disease.  She is seeking to continue her outpatient psychiatric medicine.  Apparently her primary care doctor no longer feels comfortable with him and other mental health provider she has tried to switch to have not been willing to continue her current combination.  Patient reports that she is currently suffering from anxiety low mood all pretty chronic.  Has some difficulty sleeping at night.  Feels hopeless much of the time.  Not having any psychotic symptoms.  Denies having any suicidal thoughts.  Patient is interested in possibly changing some of her medicines to try and improve her anxiety and low mood.  Social history: Patient is married lives at home not able to work outside the home.  Does have children.  Medical history: Patient has a known diagnosis of Huntington's disease.  She has some neurologic problems especially in her lower extremities that come and go.  The Huntington's disease is thought to be a major contributor to her mental health symptoms as well.  Past psychiatric history: Patient has been on other medications in the past and attempts to boost her effectiveness of her antidepressants.  She has tried Abilify and reck salty without any benefit.  Wellbutrin without any benefit.  She does have a history of at least 1 suicide attempt and a hospitalization.  Mental status exam: Patient is neatly dressed and groomed.  Cooperative and pleasant.  Good eye contact.  Psychomotor activity normal.  I did not observe anything that looked like tardive dyskinesia or inappropriate movements.  Affect anxious and dysphoric but reactive.  Thoughts lucid no sign of loosening of associations.  Short and long-term memory grossly intact.  Denies suicidal thoughts.  Good judgment and insight and understanding of medication risks.  I am familiar with the patient from her hospitalization and from Dr. Mamie Nick.  I am aware that her  current combination of medicines including the stimulants and Klonopin have been stable and helpful for her without any known history of abuse.  I am agreeable to continuing these medicines.  We talked about things that could be tried to improve her anxiety and depression.  Patient feels that increasing the amount of clonazepam would be helpful which does make sense given the low dose she is taking.  We will try going up to 3 times a day.  I will see her back in 3 months.  Patient agrees to the plan.

## 2017-12-01 NOTE — Telephone Encounter (Signed)
This patient called and said that for her non controlled medications she uses [facility]. Patient said if you have any questions please give her a call

## 2017-12-02 NOTE — Telephone Encounter (Signed)
pt called left message that her medications needed to go to [facility] can you please resend medication to correct pharmacy.

## 2017-12-02 NOTE — Telephone Encounter (Signed)
I corrected this as requested yesterday. They should be at medication management

## 2017-12-08 NOTE — Telephone Encounter (Signed)
pt called left message that she needed to speak with dr. Weber Cooks about her medicaitons

## 2017-12-10 ENCOUNTER — Other Ambulatory Visit: Payer: Self-pay | Admitting: Psychiatry

## 2017-12-10 ENCOUNTER — Telehealth: Payer: Self-pay

## 2017-12-10 MED ORDER — HALOPERIDOL 2 MG PO TABS: 2.0000 mg | ORAL_TABLET | Freq: Every day | ORAL | 1 refills | Status: DC | PRN

## 2017-12-10 MED ORDER — BUTALBITAL-APAP-CAFFEINE 50-325-40 MG PO TABS: 2.0000 | ORAL_TABLET | Freq: Four times a day (QID) | ORAL | 2 refills | Status: DC | PRN

## 2017-12-10 NOTE — Telephone Encounter (Signed)
pt called asked if you can send in haldol to [facility] and [facility]

## 2017-12-10 NOTE — Telephone Encounter (Signed)
ok 

## 2017-12-21 ENCOUNTER — Other Ambulatory Visit: Payer: Self-pay

## 2017-12-21 ENCOUNTER — Ambulatory Visit: Payer: Medicaid Other | Admitting: Pharmacist

## 2017-12-21 DIAGNOSIS — Z79899 Other long term (current) drug therapy: Secondary | ICD-10-CM

## 2017-12-21 NOTE — Progress Notes (Signed)
  [facility] Visit Note  Patient: Heather King MRN: [id number] Date of Birth: Jan 05, 1981 PCP: Valerie Roys, DO   Heather King 37 y.o. female presents for a Medication Therapy Management visit today.  There were no vitals taken for this visit.  Patient Information   Past Medical History:  Diagnosis Date  . Acute headache   . ADD (attention deficit disorder)   . Anxiety   . Aseptic meningitis   . Bipolar disorder ([place])   . Breast discharge x 6 months   Bilateral c pain  . Cancer (HCC)    Cervical Cancer  . Cervical pain   . Chronic pain syndrome   . Chronic tension type headache    not intractable  . Depression   . GERD (gastroesophageal reflux disease)   . History of cervical dysplasia   . Huntington disease ([place])   . Insomnia due to medical condition   . Loss of memory   . Mild neurocognitive disorder    attention and concentration problemss  . Rectal bleeding   . Rectal pain   . Seizure-like activity [facility])       Past Surgical History:  Procedure Laterality Date  . ABDOMINAL HYSTERECTOMY  2013  . CHOLECYSTECTOMY  2012  . LEEP  2007     Family History  Problem Relation Age of Onset  . Hypertension Mother   . Diabetes Mother   . Mental illness Mother   . Depression Mother   . Cancer Father        lung  . Alcohol abuse Father   . Dementia Maternal Grandmother   . Cancer Maternal Grandmother        breast  . Dementia Maternal Grandfather   . Cancer Maternal Grandfather        Lung  . Diabetes Brother   . ADD / ADHD Brother   . Breast cancer Maternal Aunt 43   No orders of the defined types were placed in this encounter.    Family Support: Good  Lifestyle Diet: Breakfast:Cereal, eggs Lunch:sandwich, leftovers Dinner: chicken, spaghetti Drinks: water, rare soft drinks, tea    Social History   Substance and Sexual Activity  Alcohol Use Yes   Comment: On occasion      Social History   Tobacco Use   Smoking Status Never Smoker  Smokeless Tobacco Never Used      Health Maintenance  Topic Date Due  . TETANUS/TDAP  02/16/1999  . INFLUENZA VACCINE  09/10/2017  . HIV Screening  Completed     Assessment and Plan: Huntington's Disease: patient was in good spirits today, only takes Haldol prn - aware of side effects. Most recent QTc in 06/2017 was 454.  No other issues/conditions going on.  She does not take any OTCs/herbals other than occasional ibuprofen for migraines. Patient expressed no issues with current medications.   Heather King, PharmD Pharmacy Resident  12/21/2017 4:07 PM

## 2017-12-28 NOTE — Telephone Encounter (Signed)
Patient called requesting a refill on Butalbital -Acetaminophen-caffeine 50-325-40 tabs but [facility]. Patient said that she will call the pharmacy and check on these medications. At this time nothing further is needed

## 2017-12-29 ENCOUNTER — Other Ambulatory Visit: Payer: Self-pay

## 2017-12-29 ENCOUNTER — Telehealth: Payer: Self-pay

## 2017-12-29 DIAGNOSIS — Z87410 Personal history of cervical dysplasia: Secondary | ICD-10-CM | POA: Insufficient documentation

## 2017-12-29 NOTE — Telephone Encounter (Signed)
pt called states that the wrong fioricet was sent in it was suppose to be the fioricet with the codeine in it because of her headaches.

## 2017-12-30 ENCOUNTER — Other Ambulatory Visit: Payer: Self-pay | Admitting: Psychiatry

## 2017-12-30 MED ORDER — BUTALBITAL-ASA-CAFF-CODEINE 50-325-40-30 MG PO CAPS: 1.0000 | ORAL_CAPSULE | ORAL | 3 refills | Status: DC | PRN

## 2017-12-30 NOTE — Telephone Encounter (Signed)
[facility]

## 2018-02-16 ENCOUNTER — Other Ambulatory Visit: Payer: Self-pay | Admitting: Psychiatry

## 2018-02-16 ENCOUNTER — Telehealth: Payer: Self-pay

## 2018-02-16 MED ORDER — CLONAZEPAM 0.5 MG PO TABS: 0.5000 mg | ORAL_TABLET | Freq: Three times a day (TID) | ORAL | 2 refills | Status: DC

## 2018-02-16 NOTE — Telephone Encounter (Signed)
Medication management - Telephone call with Heather King to inform Dr. Weber Cooks had sent in a new Clonazepam order today as she requested.  Patient had left a second message questioning if order was sent.

## 2018-02-16 NOTE — Telephone Encounter (Signed)
Pt called left message that she needs a refill on her klonopin.  States she has a upcoming appt but she will not have enough to get to appt.    clonazePAM (KLONOPIN) 0.5 MG tablet  Medication  Date: 12/01/2017 Department: [facility] Ordering/Authorizing: Clapacs, Madie Reno, MD  Order Providers   Prescribing Provider Encounter Provider  Clapacs, Madie Reno, MD Clapacs, Madie Reno, MD  Outpatient Medication Detail    Disp Refills Start End   clonazePAM (KLONOPIN) 0.5 MG tablet 90 tablet 2 12/01/2017    Sig - Route: Take 1 tablet (0.5 mg total) by mouth 3 (three) times daily. - Oral   Sent to pharmacy as: clonazePAM (KLONOPIN) 0.5 MG tablet   E-Prescribing Status: Receipt confirmed by pharmacy (12/01/2017 8:57 PM EDT)

## 2018-02-18 ENCOUNTER — Telehealth: Payer: Self-pay

## 2018-02-23 ENCOUNTER — Ambulatory Visit (INDEPENDENT_AMBULATORY_CARE_PROVIDER_SITE_OTHER): Payer: Self-pay | Admitting: Psychiatry

## 2018-02-23 ENCOUNTER — Encounter: Payer: Self-pay | Admitting: Psychiatry

## 2018-02-23 ENCOUNTER — Other Ambulatory Visit: Payer: Self-pay

## 2018-02-23 VITALS — BP 106/74 | HR 81 | Temp 98.7°F | Wt 151.4 lb

## 2018-02-23 DIAGNOSIS — G10 Huntington's disease: Secondary | ICD-10-CM

## 2018-02-23 MED ORDER — BUTALBITAL-ASA-CAFF-CODEINE 50-325-40-30 MG PO CAPS: 1.0000 | ORAL_CAPSULE | ORAL | 3 refills | Status: DC | PRN

## 2018-02-23 MED ORDER — CLONAZEPAM 0.5 MG PO TABS: 0.5000 mg | ORAL_TABLET | Freq: Four times a day (QID) | ORAL | 2 refills | Status: DC

## 2018-02-23 MED ORDER — AMPHETAMINE-DEXTROAMPHETAMINE 20 MG PO TABS: 20.0000 mg | ORAL_TABLET | Freq: Two times a day (BID) | ORAL | 0 refills | Status: DC

## 2018-02-23 MED ORDER — TRAZODONE HCL 100 MG PO TABS: 100.0000 mg | ORAL_TABLET | Freq: Every evening | ORAL | 2 refills | Status: DC | PRN

## 2018-02-23 MED ORDER — QUETIAPINE FUMARATE 50 MG PO TABS: 100.0000 mg | ORAL_TABLET | Freq: Two times a day (BID) | ORAL | 2 refills | Status: DC | PRN

## 2018-02-23 MED ORDER — ZOLPIDEM TARTRATE 5 MG PO TABS: 5.0000 mg | ORAL_TABLET | Freq: Every day | ORAL | 3 refills | Status: DC

## 2018-02-23 MED ORDER — FLUOXETINE HCL 20 MG PO CAPS: 40.0000 mg | ORAL_CAPSULE | Freq: Every day | ORAL | 2 refills | Status: DC

## 2018-02-23 MED ORDER — VENLAFAXINE HCL ER 75 MG PO CP24: 225.0000 mg | ORAL_CAPSULE | Freq: Every day | ORAL | 1 refills | Status: DC

## 2018-02-23 NOTE — Progress Notes (Signed)
Follow-up for this patient with Huntington's disease with multiple symptoms including anxiety and mood lability.  Reports that recently anxiety and mood swings have been worse.  Several times a week panic attacks.  Sometimes difficulty sleeping.  More mood instability with big ups and downs.  No suicidal ideation or suicidal behavior.  No report of psychosis.  Chronic stresses of medical illness nothing else really specific as far as new stress.  Neatly dressed and groomed.  Good eye contact.  Appropriate interaction.  Lucid thinking no loosening of associations no delusions.  Denies suicidal ideation.  Good insight and judgment.  Review history of medication usage.  Effexor has been in use for years now with diminishing benefit.  Previous uses of higher doses of antipsychotics have been difficult to tolerate.  After reviewing symptoms discuss with her some options.  I think the most reasonable thing would be a change in antidepressant.  I have written out a taper of Effexor that goes down to 225 mg for 2 weeks followed by 150 mg x 2 weeks followed by 75 mg for 3 weeks.  At the same time initiate fluoxetine 20 mg a day and then increase to 40 mg a day after 2 weeks.  Also increase Klonopin to 4 times a day and add Seroquel as a as needed illuminating Haldol which just makes her feel jittery and akathetic.  Follow-up 2 months.  Patient agrees to plan.

## 2018-03-16 NOTE — Telephone Encounter (Signed)
Error

## 2018-03-29 ENCOUNTER — Encounter: Payer: Self-pay | Admitting: Internal Medicine

## 2018-03-29 ENCOUNTER — Other Ambulatory Visit: Payer: Self-pay

## 2018-03-29 ENCOUNTER — Emergency Department: Payer: Medicaid Other

## 2018-03-29 ENCOUNTER — Observation Stay
Admission: EM | Admit: 2018-03-29 | Discharge: 2018-03-31 | Disposition: A | Discharge status: Home / Self Care | Payer: Medicaid Other | Attending: Internal Medicine | Admitting: Internal Medicine

## 2018-03-29 DIAGNOSIS — R443 Hallucinations, unspecified: Secondary | ICD-10-CM | POA: Diagnosis not present

## 2018-03-29 DIAGNOSIS — Z79899 Other long term (current) drug therapy: Secondary | ICD-10-CM | POA: Insufficient documentation

## 2018-03-29 DIAGNOSIS — Z7982 Long term (current) use of aspirin: Secondary | ICD-10-CM | POA: Insufficient documentation

## 2018-03-29 DIAGNOSIS — W19XXXA Unspecified fall, initial encounter: Secondary | ICD-10-CM | POA: Diagnosis not present

## 2018-03-29 DIAGNOSIS — G894 Chronic pain syndrome: Secondary | ICD-10-CM | POA: Insufficient documentation

## 2018-03-29 DIAGNOSIS — Z888 Allergy status to other drugs, medicaments and biological substances status: Secondary | ICD-10-CM | POA: Diagnosis not present

## 2018-03-29 DIAGNOSIS — F319 Bipolar disorder, unspecified: Secondary | ICD-10-CM | POA: Insufficient documentation

## 2018-03-29 DIAGNOSIS — F419 Anxiety disorder, unspecified: Secondary | ICD-10-CM | POA: Insufficient documentation

## 2018-03-29 DIAGNOSIS — Z8249 Family history of ischemic heart disease and other diseases of the circulatory system: Secondary | ICD-10-CM | POA: Diagnosis not present

## 2018-03-29 DIAGNOSIS — D72829 Elevated white blood cell count, unspecified: Secondary | ICD-10-CM | POA: Diagnosis not present

## 2018-03-29 DIAGNOSIS — G10 Huntington's disease: Secondary | ICD-10-CM | POA: Insufficient documentation

## 2018-03-29 DIAGNOSIS — R531 Weakness: Secondary | ICD-10-CM | POA: Diagnosis not present

## 2018-03-29 DIAGNOSIS — R55 Syncope and collapse: Principal | ICD-10-CM | POA: Diagnosis present

## 2018-03-29 DIAGNOSIS — K219 Gastro-esophageal reflux disease without esophagitis: Secondary | ICD-10-CM | POA: Diagnosis not present

## 2018-03-29 DIAGNOSIS — R739 Hyperglycemia, unspecified: Secondary | ICD-10-CM | POA: Insufficient documentation

## 2018-03-29 LAB — BASIC METABOLIC PANEL
Anion gap: 7 (ref 5–15)
BUN: 13 mg/dL (ref 6–20)
CHLORIDE: 108 mmol/L (ref 98–111)
CO2: 21 mmol/L — AB (ref 22–32)
Calcium: 8.7 mg/dL — ABNORMAL LOW (ref 8.9–10.3)
Creatinine, Ser: 0.71 mg/dL (ref 0.44–1.00)
GFR calc Af Amer: >60 mL/min (ref >60)
GFR calc non Af Amer: >60 mL/min (ref >60)
Glucose, Bld: 121 mg/dL — ABNORMAL HIGH (ref 70–99)
Potassium: 3.5 mmol/L (ref 3.5–5.1)
Sodium: 136 mmol/L (ref 135–145)

## 2018-03-29 LAB — CBC
HEMATOCRIT: 39.2 % (ref 36.0–46.0)
Hemoglobin: 13.1 g/dL (ref 12.0–15.0)
MCH: 31.7 pg (ref 26.0–34.0)
MCHC: 33.4 g/dL (ref 30.0–36.0)
MCV: 94.9 fL (ref 80.0–100.0)
Platelets: 391 10*3/uL (ref 150–400)
RBC: 4.13 MIL/uL (ref 3.87–5.11)
RDW: 12.6 % (ref 11.5–15.5)
WBC: 14.9 10*3/uL — ABNORMAL HIGH (ref 4.0–10.5)
nRBC: 0 % (ref 0.0–0.2)

## 2018-03-29 LAB — URINE DRUG SCREEN, QUALITATIVE (ARMC ONLY)
AMPHETAMINES, UR SCREEN: NOT DETECTED
Barbiturates, Ur Screen: NOT DETECTED
Benzodiazepine, Ur Scrn: NOT DETECTED
Cannabinoid 50 Ng, Ur ~~LOC~~: NOT DETECTED
Cocaine Metabolite,Ur ~~LOC~~: NOT DETECTED
MDMA (Ecstasy)Ur Screen: NOT DETECTED
Methadone Scn, Ur: NOT DETECTED
Opiate, Ur Screen: NOT DETECTED
Phencyclidine (PCP) Ur S: NOT DETECTED
Tricyclic, Ur Screen: POSITIVE — AB

## 2018-03-29 LAB — URINALYSIS, COMPLETE (UACMP) WITH MICROSCOPIC
BACTERIA UA: NONE SEEN
BILIRUBIN URINE: NEGATIVE
GLUCOSE, UA: NEGATIVE mg/dL
Hgb urine dipstick: NEGATIVE
KETONES UR: NEGATIVE mg/dL
Leukocytes,Ua: NEGATIVE
Nitrite: NEGATIVE
PH: 6 (ref 5.0–8.0)
Protein, ur: NEGATIVE mg/dL
SPECIFIC GRAVITY, URINE: 1.004 — AB (ref 1.005–1.030)

## 2018-03-29 LAB — TROPONIN I: Troponin I: <0.03 ng/mL (ref <0.03)

## 2018-03-29 LAB — LACTIC ACID, PLASMA: Lactic Acid, Venous: 0.7 mmol/L (ref 0.5–1.9)

## 2018-03-29 LAB — ETHANOL: Alcohol, Ethyl (B): <10 mg/dL (ref <10)

## 2018-03-29 LAB — GLUCOSE, CAPILLARY: Glucose-Capillary: 94 mg/dL (ref 70–99)

## 2018-03-29 MED ORDER — SODIUM CHLORIDE 0.9 % IV BOLUS
1000.0000 mL | Freq: Once | INTRAVENOUS | Status: AC
  Administered 2018-03-29: 1000 mL via INTRAVENOUS

## 2018-03-29 MED ORDER — PANTOPRAZOLE SODIUM 40 MG PO TBEC
40.0000 mg | DELAYED_RELEASE_TABLET | Freq: Every day | ORAL | Status: DC
  Administered 2018-03-30 – 2018-03-31 (×2): 40 mg via ORAL
  Filled 2018-03-29 (×2): qty 1

## 2018-03-29 MED ORDER — FLUOXETINE HCL 20 MG PO CAPS
40.0000 mg | ORAL_CAPSULE | Freq: Every day | ORAL | Status: DC
  Administered 2018-03-30 – 2018-03-31 (×2): 40 mg via ORAL
  Filled 2018-03-29 (×2): qty 2

## 2018-03-29 MED ORDER — BUTALBITAL-ASA-CAFF-CODEINE 50-325-40-30 MG PO CAPS: 1.0000 | ORAL_CAPSULE | ORAL | Status: DC | PRN

## 2018-03-29 MED ORDER — ENOXAPARIN SODIUM 40 MG/0.4ML ~~LOC~~ SOLN
40.0000 mg | SUBCUTANEOUS | Status: DC
  Administered 2018-03-30: 21:00:00 40 mg via SUBCUTANEOUS
  Filled 2018-03-29: qty 0.4

## 2018-03-29 MED ORDER — TRAZODONE HCL 50 MG PO TABS
100.0000 mg | ORAL_TABLET | Freq: Every evening | ORAL | Status: DC | PRN
  Filled 2018-03-29: qty 2

## 2018-03-29 MED ORDER — BISACODYL 5 MG PO TBEC: 5.0000 mg | DELAYED_RELEASE_TABLET | Freq: Every day | ORAL | Status: DC | PRN

## 2018-03-29 MED ORDER — VENLAFAXINE HCL ER 75 MG PO CP24
225.0000 mg | ORAL_CAPSULE | Freq: Every day | ORAL | Status: DC
  Administered 2018-03-30 – 2018-03-31 (×2): 225 mg via ORAL
  Filled 2018-03-29 (×2): qty 3

## 2018-03-29 MED ORDER — CLONAZEPAM 0.5 MG PO TABS
0.5000 mg | ORAL_TABLET | Freq: Four times a day (QID) | ORAL | Status: DC
  Administered 2018-03-30 – 2018-03-31 (×6): 0.5 mg via ORAL
  Filled 2018-03-29 (×7): qty 1

## 2018-03-29 MED ORDER — ONDANSETRON HCL 4 MG PO TABS: 4.0000 mg | ORAL_TABLET | Freq: Four times a day (QID) | ORAL | Status: DC | PRN

## 2018-03-29 MED ORDER — ZOLPIDEM TARTRATE 5 MG PO TABS
5.0000 mg | ORAL_TABLET | Freq: Every day | ORAL | Status: DC
  Administered 2018-03-30 (×2): 5 mg via ORAL
  Filled 2018-03-29 (×2): qty 1

## 2018-03-29 MED ORDER — AMPHETAMINE-DEXTROAMPHETAMINE 5 MG PO TABS
20.0000 mg | ORAL_TABLET | Freq: Two times a day (BID) | ORAL | Status: DC
  Administered 2018-03-30 – 2018-03-31 (×2): 20 mg via ORAL
  Filled 2018-03-29 (×3): qty 4

## 2018-03-29 MED ORDER — ACETAMINOPHEN 650 MG RE SUPP: 650.0000 mg | Freq: Four times a day (QID) | RECTAL | Status: DC | PRN

## 2018-03-29 MED ORDER — LACTATED RINGERS IV SOLN
INTRAVENOUS | Status: DC
  Administered 2018-03-30: 01:00:00 via INTRAVENOUS

## 2018-03-29 MED ORDER — ACETAMINOPHEN 325 MG PO TABS: 650.0000 mg | ORAL_TABLET | Freq: Four times a day (QID) | ORAL | Status: DC | PRN

## 2018-03-29 MED ORDER — QUETIAPINE FUMARATE 100 MG PO TABS
100.0000 mg | ORAL_TABLET | Freq: Two times a day (BID) | ORAL | Status: DC | PRN
  Administered 2018-03-30: 01:00:00 100 mg via ORAL
  Filled 2018-03-29: qty 1
  Filled 2018-03-29: qty 4

## 2018-03-29 MED ORDER — HYDROCODONE-ACETAMINOPHEN 10-325 MG PO TABS
1.0000 | ORAL_TABLET | Freq: Four times a day (QID) | ORAL | Status: DC | PRN
  Administered 2018-03-30 – 2018-03-31 (×5): 1 via ORAL
  Filled 2018-03-29 (×5): qty 1

## 2018-03-29 MED ORDER — SENNOSIDES-DOCUSATE SODIUM 8.6-50 MG PO TABS: 1.0000 | ORAL_TABLET | Freq: Every evening | ORAL | Status: DC | PRN

## 2018-03-29 MED ORDER — ONDANSETRON HCL 4 MG/2ML IJ SOLN: 4.0000 mg | Freq: Four times a day (QID) | INTRAMUSCULAR | Status: DC | PRN

## 2018-03-29 NOTE — ED Notes (Signed)
Report was called to the floor at this time.  

## 2018-03-29 NOTE — ED Notes (Signed)
Pt stated that she passed out earlier today while in the kitchen. Pt stated that when she woke up she has starry vision and seeing multiple people. Pt stated that she felt dizzy earlier as well. Pt is sitting up in the bed speaking to the visitors that are present. Respirations even/unlabored. Nadn.

## 2018-03-29 NOTE — ED Provider Notes (Addendum)
[facility] Emergency Department Provider Note  ____________________________________________   I have reviewed the triage vital signs and the nursing notes. Where available I have reviewed prior notes and, if possible and indicated, outside hospital notes.    HISTORY  Chief Complaint Dizziness and Shortness of Breath    HPI Heather King is a 38 y.o. female who history of Huntington's disease, bipolar disorder, anxiety, on Klonopin, history of multiple muscle disorders including right lower extremity weakness for "months" presents today complaining of having a episode of syncope.  She was watching Dr. Abbe Amsterdam on TV she got up to go to the kitchen, and she passed out.  She states that she has passed out only one other time in the past.  She was told she may have "epilepsy" but she states that she does not believe it.  The other time that she passed out was in the context of aseptic meningitis when she was having fever and neck pain which she is not having this time.  Patient states that she was seeing "4 of everything" initially after waking up but now her vision is restored and she feels back to baseline she is very nervous about this event.  There was no prodrome that she can think of.  She did have breakfast.  She does take Klonopin 4 times a day.  She states that after she woke up on the floor, she was not sure how she got there, she did take a Klonopin because she was worried that she was having a panic attack at that time.  However she does not recall having a pain attack before that.  She has not missed any doses of her Klonopin she takes it every day took it yesterday and did take some this morning.  She does not feel that she is in withdrawal.  She is not hearing voices she has no SI or HI she is alert and oriented.  She is very concerned about this passing out event as it is atypical for her.  She denies any injury from the fall.  She was only on the ground for a few  moments because she can tell by the progression of the TV show that it had not even gone to commercial she states.  Denies any focal numbness or weakness over her baseline from her Huntington disease.    Past Medical History:  Diagnosis Date  . Acute headache   . ADD (attention deficit disorder)   . Anxiety   . Aseptic meningitis   . Bipolar disorder ([place])   . Breast discharge x 6 months   Bilateral c pain  . Cancer (HCC)    Cervical Cancer  . Cervical pain   . Chronic pain syndrome   . Chronic tension type headache    not intractable  . Depression   . GERD (gastroesophageal reflux disease)   . History of cervical dysplasia   . Huntington disease ([place])   . Huntington's disease ([place])   . Insomnia due to medical condition   . Loss of memory   . Mild neurocognitive disorder    attention and concentration problemss  . Rectal bleeding   . Rectal pain   . Seizure-like activity [facility])     Patient Active Problem List   Diagnosis Date Noted  . History of cervical dysplasia 12/29/2017  . Controlled substance agreement broken 07/31/2017  . Altered mental status 07/14/2017  . Severe recurrent major depression without psychotic features ([place]) 06/25/2017  .  Suicide attempt ([place]) 06/25/2017  . Drug overdose 06/25/2017  . 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 Migraine headache with aura 04/08/2016  . Chronic migraine without aura 08/31/2015  . Elevated prolactin level ([place]) 08/17/2015  . Anxiety disorder 07/19/2015  . Huntington's disease ([place]) 07/19/2015  . Chronic low back pain 07/19/2015  . Chronic headaches 07/19/2015  . GERD (gastroesophageal reflux disease) 07/19/2015  . ADD (attention deficit disorder) 07/19/2015  . Mild neurocognitive disorder 05/11/2015  . Attention deficit 05/11/2015  . Insomnia due to medical condition 05/11/2015  . Loss of memory 03/28/2015  . Seizure-like activity ([place]) 03/20/2015  . Acute headache 01/22/2015  . Rectal pain 06/21/2013  . Rectal  bleeding 06/21/2013    Past Surgical History:  Procedure Laterality Date  . ABDOMINAL HYSTERECTOMY  2013  . CHOLECYSTECTOMY  2012  . LEEP  2007    Prior to Admission medications   Medication Sig Start Date End Date Taking? Authorizing Provider  amphetamine-dextroamphetamine (ADDERALL) 20 MG tablet Take 1 tablet (20 mg total) by mouth 2 (two) times daily. 02/23/18   Clapacs, Madie Reno, MD  amphetamine-dextroamphetamine (ADDERALL) 20 MG tablet Take 1 tablet (20 mg total) by mouth 2 (two) times daily with a meal. 02/23/18   Clapacs, Madie Reno, MD  amphetamine-dextroamphetamine (ADDERALL) 20 MG tablet Take 1 tablet (20 mg total) by mouth 2 (two) times daily with a meal. 02/23/18   Clapacs, Madie Reno, MD  butalbital-acetaminophen-caffeine (FIORICET WITH CODEINE) 50-325-40-30 MG capsule TAKE ONE CAPSULE BY MOUTH 4 TIMES A DAY AS NEEDED 12/02/17   [provider]  butalbital-acetaminophen-caffeine (FIORICET, ESGIC) 50-325-40 MG tablet Take 2 tablets by mouth every 6 (six) hours as needed for headache. 12/10/17   Clapacs, Madie Reno, MD  butalbital-aspirin-caffeine-codeine (FIORINAL/CODEINE #3) [phone] MG capsule Take 1 capsule by mouth every 4 (four) hours as needed for pain or headache. 02/23/18   Clapacs, Madie Reno, MD  cetirizine (ZYRTEC) 10 MG tablet Take 1 tablet (10 mg total) by mouth daily. 06/28/17 06/28/18  Pucilowska, Wardell Honour, MD  clonazePAM (KLONOPIN) 0.5 MG tablet Take 1 tablet (0.5 mg total) by mouth 4 (four) times daily. 02/23/18   Clapacs, Madie Reno, MD  esomeprazole (NEXIUM) 40 MG capsule Take 1 capsule (40 mg total) by mouth daily. 06/28/17   Pucilowska, Herma Ard B, MD  FLUoxetine (PROZAC) 20 MG capsule Take 2 capsules (40 mg total) by mouth daily. 20mg  per day for 2 weeks, then increase to 40mg  per day 02/23/18   Clapacs, Madie Reno, MD  HYDROcodone-acetaminophen (NORCO) 10-325 MG tablet Take 1 tablet by mouth every 6 (six) hours as needed.    [provider]  QUEtiapine (SEROQUEL) 50 MG  tablet Take 2 tablets (100 mg total) by mouth 2 (two) times daily as needed (anxiety). 02/23/18   Clapacs, Madie Reno, MD  traZODone (DESYREL) 100 MG tablet Take 1 tablet (100 mg total) by mouth at bedtime as needed for sleep. 02/23/18   Clapacs, Madie Reno, MD  venlafaxine XR (EFFEXOR-XR) 75 MG 24 hr capsule Take 3 capsules (225 mg total) by mouth daily with breakfast. Taper as directed over 7 weeks 02/23/18   Clapacs, Madie Reno, MD  zolpidem (AMBIEN) 5 MG tablet Take 1 tablet (5 mg total) by mouth at bedtime. 02/23/18   Clapacs, Madie Reno, MD    Allergies Abilify [aripiprazole]; Gabapentin; and Nifedipine  Family History  Problem Relation Age of Onset  . Hypertension Mother   . Diabetes Mother   . Mental illness Mother   .  Depression Mother   . Cancer Father        lung  . Alcohol abuse Father   . Dementia Maternal Grandmother   . Cancer Maternal Grandmother        breast  . Dementia Maternal Grandfather   . Cancer Maternal Grandfather        Lung  . Diabetes Brother   . ADD / ADHD Brother   . Breast cancer Maternal Aunt 65    Social History Social History   Tobacco Use  . Smoking status: Never Smoker  . Smokeless tobacco: Never Used  Substance Use Topics  . Alcohol use: Not Currently    Comment: On occasion  . Drug use: No    Review of Systems Constitutional: No fever/chills Eyes: No visual changes. ENT: No sore throat. No stiff neck no neck pain Cardiovascular: Denies chest pain. Respiratory: Denies shortness of breath. Gastrointestinal:   no vomiting.  No diarrhea.  No constipation. Genitourinary: Negative for dysuria. Musculoskeletal: Negative lower extremity swelling Skin: Negative for rash. Neurological: Negative for severe headaches, focal weakness or numbness.   ____________________________________________   PHYSICAL EXAM:  VITAL SIGNS: ED Triage Vitals  Enc Vitals Group     BP 03/29/18 1716 128/83     Pulse Rate 03/29/18 1716 84     Resp 03/29/18 1716 18      Temp 03/29/18 1716 98.1 F (36.7 C)     Temp Source 03/29/18 1716 Oral     SpO2 03/29/18 1716 98 %     Weight 03/29/18 1717 138 lb (62.6 kg)     Height 03/29/18 1717 5\' 2"  (1.575 m)     King Circumference --      Peak Flow --      Pain Score 03/29/18 1717 0     Pain Loc --      Pain Edu? --      Excl. in [place]? --     Constitutional: Alert and oriented. Well appearing and in no acute distress. Eyes: Conjunctivae are normal King: Atraumatic HEENT: No congestion/rhinnorhea. Mucous membranes are moist.  Oropharynx non-erythematous Neck:   Nontender with no meningismus, no masses, no stridor Cardiovascular: Normal rate, regular rhythm. Grossly normal heart sounds.  Good peripheral circulation. Respiratory: Normal respiratory effort.  No retractions. Lungs CTAB. Abdominal: Soft and nontender. No distention. No guarding no rebound Back:  There is no focal tenderness or step off.  there is no midline tenderness there are no lesions noted. there is no CVA tenderness Musculoskeletal: No lower extremity tenderness, no upper extremity tenderness. No joint effusions, no DVT signs strong distal pulses no edema Neurologic:  Normal speech and language.  Finger-to-nose is a little bit unsteady which patient states is her baseline, she has strength and sensation intact in all extremities except for the right lower extremity which is somewhat weak.  Again, patient states this is her baseline.  Nerves are intact.  There is no obvious visual field deficit.  Patient has a little bit of trouble with this and she states that would be normal for her..  Skin:  Skin is warm, dry and intact. No rash noted. Psychiatric: Mood and affect are normal. Speech and behavior are normal.  ____________________________________________   LABS (all labs ordered are listed, but only abnormal results are displayed)  Labs Reviewed  BASIC METABOLIC PANEL - Abnormal; Notable for the following components:      Result Value   CO2  21 (*)    Glucose, Bld 121 (*)  Calcium 8.7 (*)    All other components within normal limits  CBC - Abnormal; Notable for the following components:   WBC 14.9 (*)    All other components within normal limits  TROPONIN I  GLUCOSE, CAPILLARY  URINALYSIS, COMPLETE (UACMP) WITH MICROSCOPIC  CBG MONITORING, ED    Pertinent labs  results that were available during my care of the patient were reviewed by me and considered in my medical decision making (see chart for details). ____________________________________________  EKG  I personally interpreted any EKGs ordered by me or triage Sinus rhythm, rate 87 bpm no acute ST elevation or depression normal axis unremarkable EKG ____________________________________________  RADIOLOGY  Pertinent labs & imaging results that were available during my care of the patient were reviewed by me and considered in my medical decision making (see chart for details). If possible, patient and/or family made aware of any abnormal findings.  Dg Chest 2 View  Result Date: 03/29/2018 CLINICAL DATA:  38 year old female with a history of fall EXAM: CHEST - 2 VIEW COMPARISON:  None. FINDINGS: The heart size and mediastinal contours are within normal limits. Both lungs are clear. The visualized skeletal structures are unremarkable. IMPRESSION: Negative for acute cardiopulmonary disease Electronically Signed   By: Corrie Mckusick D.O.   On: 03/29/2018 18:08   Ct King Wo Contrast  Result Date: 03/29/2018 CLINICAL DATA:  Fall with hallucinations. Reported Huntington's disease EXAM: CT King WITHOUT CONTRAST TECHNIQUE: Contiguous axial images were obtained from the base of the skull through the vertex without intravenous contrast. COMPARISON:  July 14, 2017 FINDINGS: Brain: The ventricles are normal in size and configuration. There is no intracranial mass, hemorrhage, extra-axial fluid collection, or midline shift. Brain parenchyma appears unremarkable. No evident acute  infarct. Vascular: No hyperdense vessel.  No evident vascular lesions. Skull: Bony calvarium appears intact. Sinuses/Orbits: There is opacification in portions of the left sphenoid sinus. Other visualized paranasal sinuses are clear. Visualized orbits appear symmetric bilaterally. Other: Mastoid air cells are clear. IMPRESSION: Left sphenoid sinus disease. Brain parenchyma appears unremarkable. No mass or hemorrhage. Electronically Signed   By: Lowella Grip III M.D.   On: 03/29/2018 17:37   ____________________________________________    PROCEDURES  Procedure(s) performed: None  Procedures  Critical Care performed: None  ____________________________________________   INITIAL IMPRESSION / ASSESSMENT AND PLAN / ED COURSE  Pertinent labs & imaging results that were available during my care of the patient were reviewed by me and considered in my medical decision making (see chart for details).  Patient here with a syncopal event, and then some visual defects which changed back to baseline rapidly, she does not appear to be in withdrawal.  She is in no acute distress.  She denies shortness of breath today although she stated something about that on triage.  I do not think this likely represents PE no risk factors for PE, no pleuritic pain or shortness of breath is reported to me anyway.  Patient is laughing and joking with her family but she states she is very concerned about going home.  She does have some right lower extremity weakness but that she states that she has had for months.  No evidence otherwise of CVA.  CT is negative.  We will see what her urinalysis shows we will watch her in the department and we will reassess.   b ----------------------------------------- 8:48 PM on 03/29/2018 -----------------------------------------  Patient in no acute distress, at baseline in all respects according to patient and family, we talked  about admission to the hospital.  They would much  prefer to go home if possible.  We will give her a liter of fluid and see how she feels.  Husband reports that the patient has been up doing much more than she normally does did not have much to eat or drink and stood up suddenly before she passed out and he feels this is likely why.  My work-up, is comprehensive as I can make it in the ER, is reassuring.  We will see how she feels after fluid and reassess   ____________________________________________   FINAL CLINICAL IMPRESSION(S) / ED DIAGNOSES  Final diagnoses:  None      This chart was dictated using voice recognition software.  Despite best efforts to proofread,  errors can occur which can change meaning.      Schuyler Amor, MD 03/29/18 1948    Schuyler Amor, MD 03/29/18 2050

## 2018-03-29 NOTE — ED Notes (Signed)
Pt remembers last feeling normal at 1500 today.

## 2018-03-29 NOTE — ED Triage Notes (Signed)
Pt arrives to ER via POV. Pt had witnessed fall to ground while walking in ER. Pt hyperventilating, states that she has "starry vision and multiple people". Pt states she awoke on kitchen floor, unsure how long she was on ground for. Pt has hx of Huntington's disease.

## 2018-03-29 NOTE — ED Notes (Signed)
Pt took klonopin approx 1430. Pt states she takes klonopin X 4 per day.

## 2018-03-30 LAB — HEPATIC FUNCTION PANEL
ALT: 17 U/L (ref 0–44)
AST: 19 U/L (ref 15–41)
Albumin: 3.5 g/dL (ref 3.5–5.0)
Alkaline Phosphatase: 72 U/L (ref 38–126)
Bilirubin, Direct: <0.1 mg/dL (ref 0.0–0.2)
Total Bilirubin: 0.4 mg/dL (ref 0.3–1.2)
Total Protein: 6.2 g/dL — ABNORMAL LOW (ref 6.5–8.1)

## 2018-03-30 LAB — GLUCOSE, CAPILLARY
Glucose-Capillary: 83 mg/dL (ref 70–99)
Glucose-Capillary: 120 mg/dL — ABNORMAL HIGH (ref 70–99)
Glucose-Capillary: 80 mg/dL (ref 70–99)
Glucose-Capillary: 88 mg/dL (ref 70–99)

## 2018-03-30 LAB — C DIFFICILE QUICK SCREEN W PCR REFLEX
C Diff antigen: NEGATIVE
C Diff interpretation: NOT DETECTED
C Diff toxin: NEGATIVE

## 2018-03-30 LAB — PHOSPHORUS: Phosphorus: 2.5 mg/dL (ref 2.5–4.6)

## 2018-03-30 LAB — LACTIC ACID, PLASMA: Lactic Acid, Venous: 1.6 mmol/L (ref 0.5–1.9)

## 2018-03-30 LAB — MAGNESIUM: Magnesium: 2 mg/dL (ref 1.7–2.4)

## 2018-03-30 LAB — PREALBUMIN: PREALBUMIN: 24.9 mg/dL (ref 18–38)

## 2018-03-30 MED ORDER — BUTALBITAL-APAP-CAFFEINE 50-325-40 MG PO TABS: 1.0000 | ORAL_TABLET | ORAL | Status: DC | PRN

## 2018-03-30 NOTE — Progress Notes (Signed)
Pt refuses bed alarm.  Education provided.

## 2018-03-30 NOTE — H&P (Signed)
[facility] [place] NAME: Heather King    MR#:  [id number]  DATE OF BIRTH:  December 14, 1980  DATE OF ADMISSION:  03/29/2018  PRIMARY CARE PHYSICIAN: Valerie Roys, DO   REQUESTING/REFERRING PHYSICIAN: Schuyler Amor, MD  CHIEF COMPLAINT:   Chief Complaint  Patient presents with  . Dizziness  . Shortness of Breath    HISTORY OF PRESENT ILLNESS:  Heather King  is a 38 y.o. female with a known history of Huntington's Disease (w/ chronic RLE weakness), anx/dep/BPD p/w syncope. Pt endorses good PO intake. Was at home watching Dr. Abbe Amsterdam in the afternoon (@~1500-1530PM), got up to get something out of the fridge. (+) LH, (+) blackout/LOC. Unknown downtime; pt says Dr. Abbe Amsterdam was still on TV when she regained consciousness. Called husband and family, and was driven to ED. SBP 90s, Orthostatics (+). Exam largely normal. Pt appears well.  PAST MEDICAL HISTORY:   Past Medical History:  Diagnosis Date  . Acute headache   . ADD (attention deficit disorder)   . Anxiety   . Aseptic meningitis   . Bipolar disorder ([place])   . Breast discharge x 6 months   Bilateral c pain  . Cancer (HCC)    Cervical Cancer  . Cervical pain   . Chronic pain syndrome   . Chronic tension type headache    not intractable  . Depression   . GERD (gastroesophageal reflux disease)   . History of cervical dysplasia   . Huntington disease ([place])   . Huntington's disease ([place])   . Insomnia due to medical condition   . Loss of memory   . Mild neurocognitive disorder    attention and concentration problemss  . Rectal bleeding   . Rectal pain   . Seizure-like activity ([place])     PAST SURGICAL HISTORY:   Past Surgical History:  Procedure Laterality Date  . ABDOMINAL HYSTERECTOMY  2013  . CHOLECYSTECTOMY  2012  . LEEP  2007    SOCIAL HISTORY:   Social History   Tobacco Use  . Smoking status: Never Smoker  . Smokeless tobacco: Never Used  Substance Use Topics    . Alcohol use: Not Currently    Comment: On occasion    FAMILY HISTORY:   Family History  Problem Relation Age of Onset  . Hypertension Mother   . Diabetes Mother   . Mental illness Mother   . Depression Mother   . Cancer Father        lung  . Alcohol abuse Father   . Dementia Maternal Grandmother   . Cancer Maternal Grandmother        breast  . Dementia Maternal Grandfather   . Cancer Maternal Grandfather        Lung  . Diabetes Brother   . ADD / ADHD Brother   . Breast cancer Maternal Aunt 43    DRUG ALLERGIES:   Allergies  Allergen Reactions  . Abilify [Aripiprazole] Other (See Comments)    akathisia  . Gabapentin Rash  . Nifedipine Rash    To cream    REVIEW OF SYSTEMS:   Review of Systems  Constitutional: Negative for chills, diaphoresis, fever, malaise/fatigue and weight loss.  HENT: Negative for congestion, ear pain, hearing loss, nosebleeds, sinus pain, sore throat and tinnitus.   Eyes: Negative for blurred vision, double vision and photophobia.  Respiratory: Negative for cough, hemoptysis, sputum production, shortness of breath and wheezing.   Cardiovascular: Negative for  chest pain, palpitations, orthopnea, claudication, leg swelling and PND.  Gastrointestinal: Negative for abdominal pain, blood in stool, constipation, diarrhea, heartburn, melena, nausea and vomiting.  Genitourinary: Negative for dysuria, frequency, hematuria and urgency.  Musculoskeletal: Negative for back pain, joint pain, myalgias and neck pain.  Skin: Negative for itching and rash.  Neurological: Positive for dizziness and loss of consciousness. Negative for tingling, tremors, sensory change, speech change, focal weakness, seizures, weakness and headaches.  Psychiatric/Behavioral: Negative for depression and memory loss. The patient is not nervous/anxious and does not have insomnia.    MEDICATIONS AT HOME:   Prior to Admission medications   Medication Sig Start Date End Date  Taking? Authorizing Provider  amphetamine-dextroamphetamine (ADDERALL) 20 MG tablet Take 1 tablet (20 mg total) by mouth 2 (two) times daily. 02/23/18  Yes Clapacs, Madie Reno, MD  butalbital-aspirin-caffeine-codeine (FIORINAL/CODEINE #3) [phone] MG capsule Take 1 capsule by mouth every 4 (four) hours as needed for pain or headache. 02/23/18  Yes Clapacs, Madie Reno, MD  cetirizine (ZYRTEC) 10 MG tablet Take 1 tablet (10 mg total) by mouth daily. 06/28/17 06/28/18 Yes Pucilowska, Jolanta B, MD  clonazePAM (KLONOPIN) 0.5 MG tablet Take 1 tablet (0.5 mg total) by mouth 4 (four) times daily. 02/23/18  Yes Clapacs, Madie Reno, MD  esomeprazole (NEXIUM) 40 MG capsule Take 1 capsule (40 mg total) by mouth daily. 06/28/17  Yes Pucilowska, Jolanta B, MD  FLUoxetine (PROZAC) 20 MG capsule Take 2 capsules (40 mg total) by mouth daily. 20mg  per day for 2 weeks, then increase to 40mg  per day 02/23/18  Yes Clapacs, Madie Reno, MD  HYDROcodone-acetaminophen (NORCO) 10-325 MG tablet Take 1 tablet by mouth every 6 (six) hours as needed.   Yes [provider]  QUEtiapine (SEROQUEL) 50 MG tablet Take 2 tablets (100 mg total) by mouth 2 (two) times daily as needed (anxiety). 02/23/18  Yes Clapacs, Madie Reno, MD  traZODone (DESYREL) 100 MG tablet Take 1 tablet (100 mg total) by mouth at bedtime as needed for sleep. 02/23/18  Yes Clapacs, Madie Reno, MD  venlafaxine XR (EFFEXOR-XR) 75 MG 24 hr capsule Take 3 capsules (225 mg total) by mouth daily with breakfast. Taper as directed over 7 weeks 02/23/18  Yes Clapacs, Madie Reno, MD  zolpidem (AMBIEN) 5 MG tablet Take 1 tablet (5 mg total) by mouth at bedtime. 02/23/18  Yes Clapacs, Madie Reno, MD      VITAL SIGNS:  Blood pressure 102/62, pulse 78, temperature 97.6 F (36.4 C), temperature source Oral, resp. rate 19, height 5\' 2"  (1.575 m), weight 62.6 kg, SpO2 99 %.  PHYSICAL EXAMINATION:  Physical Exam Constitutional:      General: She is not in acute distress.    Appearance: She is not  ill-appearing, toxic-appearing or diaphoretic.  HENT:     Head: Atraumatic.     Mouth/Throat:     Pharynx: Oropharynx is clear.  Eyes:     General: No scleral icterus.    Extraocular Movements: Extraocular movements intact.     Conjunctiva/sclera: Conjunctivae normal.  Neck:     Musculoskeletal: Neck supple.  Cardiovascular:     Rate and Rhythm: Normal rate and regular rhythm.  No extrasystoles are present.    Heart sounds: S1 normal and S2 normal. Heart sounds not distant. No murmur. No friction rub. No gallop. No S3 or S4 sounds.   Pulmonary:     Effort: No respiratory distress.     Breath sounds: Normal breath sounds. No stridor. No wheezing, rhonchi  or rales.  Abdominal:     General: Bowel sounds are normal. There is no distension.     Palpations: Abdomen is soft.     Tenderness: There is no abdominal tenderness. There is no guarding or rebound.     Hernia: No hernia is present.  Musculoskeletal: Normal range of motion.        General: No swelling or tenderness.     Right lower leg: No edema.     Left lower leg: No edema.  Lymphadenopathy:     Cervical: No cervical adenopathy.  Skin:    General: Skin is warm and dry.     Findings: No erythema or rash.  Neurological:     Mental Status: She is alert and oriented to person, place, and time. Mental status is at baseline.  Psychiatric:        Mood and Affect: Mood normal.        Behavior: Behavior normal.        Thought Content: Thought content normal.        Judgment: Judgment normal.     LABORATORY PANEL:   CBC Recent Labs  Lab 03/29/18 1722  WBC 14.9*  HGB 13.1  HCT 39.2  PLT 391   ------------------------------------------------------------------------------------------------------------------  Chemistries  Recent Labs  Lab 03/29/18 1722 03/30/18 0054  NA 136  --   K 3.5  --   CL 108  --   CO2 21*  --   GLUCOSE 121*  --   BUN 13  --   CREATININE 0.71  --   CALCIUM 8.7*  --   MG  --  2.0  AST  --   19  ALT  --  17  ALKPHOS  --  72  BILITOT  --  0.4   ------------------------------------------------------------------------------------------------------------------  Cardiac Enzymes Recent Labs  Lab 03/29/18 1722  TROPONINI <0.03   ------------------------------------------------------------------------------------------------------------------  RADIOLOGY:  Dg Chest 2 View  Result Date: 03/29/2018 CLINICAL DATA:  38 year old female with a history of fall EXAM: CHEST - 2 VIEW COMPARISON:  None. FINDINGS: The heart size and mediastinal contours are within normal limits. Both lungs are clear. The visualized skeletal structures are unremarkable. IMPRESSION: Negative for acute cardiopulmonary disease Electronically Signed   By: Corrie Mckusick D.O.   On: 03/29/2018 18:08   Ct Head Wo Contrast  Result Date: 03/29/2018 CLINICAL DATA:  Fall with hallucinations. Reported Huntington's disease EXAM: CT HEAD WITHOUT CONTRAST TECHNIQUE: Contiguous axial images were obtained from the base of the skull through the vertex without intravenous contrast. COMPARISON:  July 14, 2017 FINDINGS: Brain: The ventricles are normal in size and configuration. There is no intracranial mass, hemorrhage, extra-axial fluid collection, or midline shift. Brain parenchyma appears unremarkable. No evident acute infarct. Vascular: No hyperdense vessel.  No evident vascular lesions. Skull: Bony calvarium appears intact. Sinuses/Orbits: There is opacification in portions of the left sphenoid sinus. Other visualized paranasal sinuses are clear. Visualized orbits appear symmetric bilaterally. Other: Mastoid air cells are clear. IMPRESSION: Left sphenoid sinus disease. Brain parenchyma appears unremarkable. No mass or hemorrhage. Electronically Signed   By: Lowella Grip III M.D.   On: 03/29/2018 17:37   IMPRESSION AND PLAN:   A/P: 70F syncope, orthostasis. Hyperglycemia, hypocalcemia, leukocytosis. -Syncope: (+) orthostasis.  Exam unimpressive. Trop-I (-). EKG NSR, QTc 464. CXR, CT head unimpressive. IVF, rpt orthostatic VS in AM. FSG qHS/AC. Not ordered for neuro checks, cardiac enzymes or Echo (low suspicion for neurogenic/cardiogenic syncope). Cardiac monitoring. -Hyperglycemia, leukocytosis: 2/2 stress/demargination vs. dehydration/hemoconcentration.  IVF. -Hypocalcemia: Ionized calcium. -c/w home meds/formulary subs as tolerated. -FEN/GI: Regular diet. -DVT PPx: Lovenox. -Code status: Full code. -Disposition: Observation, < 2 midnights. May likely D/C home in short order.   All the records are reviewed and case discussed with ED provider. Management plans discussed with the patient, family and they are in agreement.  CODE STATUS: Full code.  TOTAL TIME TAKING CARE OF THIS PATIENT: 60 minutes.    Arta Silence M.D on 03/30/2018 at 1:45 AM  Between 7am to 6pm - Pager - [phone]  After 6pm go to [email or web address] - Proofreader  [facility]  Office  [phone]  CC: Primary care physician; Valerie Roys, DO   Note: This dictation was prepared with Dragon dictation along with smaller phrase technology. Any transcriptional errors that result from this process are unintentional.

## 2018-03-30 NOTE — Progress Notes (Signed)
[facility] [place] NAME: Heather King    MR#:  [id number]  DATE OF BIRTH:  1981/01/26  SUBJECTIVE:   Patient came into the hospital with a syncopal episode while she was watching TV at home in the afternoon. Not sure if there was anyone present at home. However when she regained consciousness the same sure was discontinued it could be for few minutes. When she came to the emergency room her orthostatic vitals signs were positive for hypotension.  She feels better today. She is complaining of this right lower extremity weakness which has been for several months. She has not followed [facility] neurology for Huntington's chorea. She also has some stars that she intermittently sees. No seizures noted by family or in the ER by nursing staff here. REVIEW OF SYSTEMS:   Review of Systems  Constitutional: Negative for chills, fever and weight loss.  HENT: Negative for ear discharge, ear pain and nosebleeds.   Eyes: Negative for blurred vision, pain and discharge.  Respiratory: Negative for sputum production, shortness of breath, wheezing and stridor.   Cardiovascular: Negative for chest pain, palpitations, orthopnea and PND.  Gastrointestinal: Negative for abdominal pain, diarrhea, nausea and vomiting.  Genitourinary: Negative for frequency and urgency.  Musculoskeletal: Negative for back pain and joint pain.  Neurological: Positive for weakness. Negative for sensory change, speech change and focal weakness.  Psychiatric/Behavioral: Negative for depression and hallucinations. The patient is not nervous/anxious.    Tolerating Diet:yes Tolerating PT:   DRUG ALLERGIES:   Allergies  Allergen Reactions  . Abilify [Aripiprazole] Other (See Comments)    akathisia  . Gabapentin Rash  . Nifedipine Rash    To cream    VITALS:  Blood pressure (!) 81/60, pulse 72, temperature 98 F (36.7 C), temperature source Oral, resp. rate 16, height 5\' 2"  (1.575  m), weight 62.6 kg, SpO2 100 %.  PHYSICAL EXAMINATION:   Physical Exam  GENERAL:  38 y.o.-year-old patient lying in the bed with no acute distress.  EYES: Pupils equal, round, reactive to light and accommodation. No scleral icterus. Extraocular muscles intact.  HEENT: Head atraumatic, normocephalic. Oropharynx and nasopharynx clear.  NECK:  Supple, no jugular venous distention. No thyroid enlargement, no tenderness.  LUNGS: Normal breath sounds bilaterally, no wheezing, rales, rhonchi. No use of accessory muscles of respiration.  CARDIOVASCULAR: S1, S2 normal. No murmurs, rubs, or gallops.  ABDOMEN: Soft, nontender, nondistended. Bowel sounds present. No organomegaly or mass.  EXTREMITIES: No cyanosis, clubbing or edema b/l.    NEUROLOGIC: Cranial nerves II through XII are intact. No focal Motor or sensory deficits b/l. Patient ambulated very well with me. No ataxia or unsteady gait noted. She has some weakness in her right LE (subjective) PSYCHIATRIC:  patient is alert and oriented x 3.  SKIN: No obvious rash, lesion, or ulcer.   LABORATORY PANEL:  CBC Recent Labs  Lab 03/29/18 1722  WBC 14.9*  HGB 13.1  HCT 39.2  PLT 391    Chemistries  Recent Labs  Lab 03/29/18 1722 03/30/18 0054  NA 136  --   K 3.5  --   CL 108  --   CO2 21*  --   GLUCOSE 121*  --   BUN 13  --   CREATININE 0.71  --   CALCIUM 8.7*  --   MG  --  2.0  AST  --  19  ALT  --  17  ALKPHOS  --  72  BILITOT  --  0.4   Cardiac Enzymes Recent Labs  Lab 03/29/18 1722  TROPONINI <0.03   RADIOLOGY:  Dg Chest 2 View  Result Date: 03/29/2018 CLINICAL DATA:  38 year old female with a history of fall EXAM: CHEST - 2 VIEW COMPARISON:  None. FINDINGS: The heart size and mediastinal contours are within normal limits. Both lungs are clear. The visualized skeletal structures are unremarkable. IMPRESSION: Negative for acute cardiopulmonary disease Electronically Signed   By: Corrie Mckusick D.O.   On: 03/29/2018  18:08   Ct Head Wo Contrast  Result Date: 03/29/2018 CLINICAL DATA:  Fall with hallucinations. Reported Huntington's disease EXAM: CT HEAD WITHOUT CONTRAST TECHNIQUE: Contiguous axial images were obtained from the base of the skull through the vertex without intravenous contrast. COMPARISON:  July 14, 2017 FINDINGS: Brain: The ventricles are normal in size and configuration. There is no intracranial mass, hemorrhage, extra-axial fluid collection, or midline shift. Brain parenchyma appears unremarkable. No evident acute infarct. Vascular: No hyperdense vessel.  No evident vascular lesions. Skull: Bony calvarium appears intact. Sinuses/Orbits: There is opacification in portions of the left sphenoid sinus. Other visualized paranasal sinuses are clear. Visualized orbits appear symmetric bilaterally. Other: Mastoid air cells are clear. IMPRESSION: Left sphenoid sinus disease. Brain parenchyma appears unremarkable. No mass or hemorrhage. Electronically Signed   By: Lowella Grip III M.D.   On: 03/29/2018 17:37   ASSESSMENT AND PLAN:   Heather King is a 38 y.o. female who history of Huntington's disease, bipolar disorder, anxiety, on Klonopin, history of multiple muscle disorders including right lower extremity weakness for "months" presents today complaining of having a episode of syncope.  She was watching Dr. Abbe Amsterdam on TV she got up to go to the kitchen, and she passed out. Patient was found to have low blood pressure in the hospital emergency room and positive orthostatic's  1. Syncopal episode suspected due to orthostatic hypotension -CT had negative other than left sphenoid sinusitis. -Patient received IV fluids. She remains sinus rhythm on telemetry. Her orthostatic vital signs checked by me this afternoon will within normal limits. -Patient did ambulate with me without any difficulty. -Asked her to continue oral hydration.  2. History of Huntington's chorea -patient was following up with  [facility] however she stopped following them due to insurance issues. She has insurance now and I discussed with her to follow-up with [facility] neurolog[facility]. I offered her to see if she wanted to see neurology here and get possible EEG she declined. She states she feels better at present and will follow-up with [facility] neurology as outpatient  3. Bipolar disorder continue her meds patient follows with Dr. Weber Cooks  4. DVT prophylaxis subcu Lovenox  I discussed with patient if she would want to go home. She says she would like to stay another day because her husband is out of town will be back tomorrow.    Case discussed with Care Management/Social Worker. Management plans discussed with the patient and in agreement.  CODE STATUS: full  DVT Prophylaxis: **Lovenox*  TOTAL TIME TAKING CARE OF THIS PATIENT: **30* minutes.  >50% time spent on counselling and coordination of care  POSSIBLE D/C IN *one** DAYS, DEPENDING ON CLINICAL CONDITION.  Note: This dictation was prepared with Dragon dictation along with smaller phrase technology. Any transcriptional errors that result from this process are unintentional.  Fritzi Mandes M.D on 03/30/2018 at 3:12 PM  Between 7am to 6pm - Pager - [phone]  After 6pm go to [email or web address] - password [email or web address]  [facility]  Office  [phone]  CC: Primary care physician; No primary care provider on file.Patient ID: Heather King, female   DOB: 26-Feb-1980, 38 y.o.   MRN: [id number]

## 2018-03-31 LAB — GASTROINTESTINAL PANEL BY PCR, STOOL (REPLACES STOOL CULTURE)

## 2018-03-31 LAB — GLUCOSE, CAPILLARY: Glucose-Capillary: 76 mg/dL (ref 70–99)

## 2018-03-31 LAB — CALCIUM, IONIZED: Calcium, Ionized, Serum: 4.4 mg/dL — ABNORMAL LOW (ref 4.5–5.6)

## 2018-03-31 NOTE — Discharge Instructions (Signed)
Patient advised to get appointment with her [facility] neurology for Huntington's chorea on her schedule. She is also advised to get primary care physician in the area.

## 2018-03-31 NOTE — Discharge Summary (Signed)
Heather King at [place] NAME: Heather King    MR#:  [id number]  DATE OF BIRTH:  07/05/1980  DATE OF ADMISSION:  03/29/2018 ADMITTING PHYSICIAN: Arta Silence, MD  DATE OF DISCHARGE: 03/31/2018  PRIMARY CARE PHYSICIAN: No primary care provider on file.    ADMISSION DIAGNOSIS:  Syncope, unspecified syncope type [R55]  DISCHARGE DIAGNOSIS:  syncopal episode suspected due to orthostatic hypotension-- resolved  SECONDARY DIAGNOSIS:   Past Medical History:  Diagnosis Date  . Acute headache   . ADD (attention deficit disorder)   . Anxiety   . Aseptic meningitis   . Bipolar disorder ([place])   . Breast discharge x 6 months   Bilateral c pain  . Cancer (HCC)    Cervical Cancer  . Cervical pain   . Chronic pain syndrome   . Chronic tension type headache    not intractable  . Depression   . GERD (gastroesophageal reflux disease)   . History of cervical dysplasia   . Huntington disease ([place])   . Huntington's disease ([place])   . Insomnia due to medical condition   . Loss of memory   . Mild neurocognitive disorder    attention and concentration problemss  . Rectal bleeding   . Rectal pain   . Seizure-like activity [facility])     HOSPITAL COURSE:   [facility]ather [facility] a 38 y.o.femalewho history of Huntington's disease, bipolar disorder, anxiety, on Klonopin, history of multiple muscle disorders including right lower extremity weakness for "months" presents today complaining of having a episode of syncope. She was watching Dr. Abbe Amsterdam on TV she got up to go to the kitchen, and she passed out. Patient was found to have low blood pressure in the hospital emergency room and positive orthostatic's  1. Syncopal episode suspected due to orthostatic hypotension -CT had negative other than left sphenoid sinusitis. -Patient received IV fluids. She remains sinus rhythm on telemetry. Her orthostatic vital signs checked by me this afternoon will  within normal limits. -Patient did ambulate with me without any difficulty. -Asked her to continue oral hydration. -Blood pressure today 133/65, HR 70  2. History of Huntington's chorea -patient was following up with [place] neurology however she stopped following them due to insurance issues. She has insurance now and I discussed with her to follow-up with [facility] neurology Dr Doralee Albino. I offered her to see if she wanted to see neurology here and get possible EEG she declined. She states she feels better at present and will follow-up with [facility] neurology as outpatient  3. Bipolar disorder continue her meds patient follows with Dr. Weber Cooks  4. DVT prophylaxis subcu Lovenox  Patient says she is ready for discharge. CONSULTS OBTAINED:  Treatment Team:  Arta Silence, MD  DRUG ALLERGIES:   Allergies  Allergen Reactions  . Abilify [Aripiprazole] Other (See Comments)    akathisia  . Gabapentin Rash  . Nifedipine Rash    To cream    DISCHARGE MEDICATIONS:   Allergies as of 03/31/2018      Reactions   Abilify [aripiprazole] Other (See Comments)   akathisia   Gabapentin Rash   Nifedipine Rash   To cream      Medication List    TAKE these medications   amphetamine-dextroamphetamine 20 MG tablet Commonly known as:  ADDERALL Take 1 tablet (20 mg total) by mouth 2 (two) times daily.   butalbital-aspirin-caffeine-codeine 50-325-40-30 MG capsule Commonly known as:  FIORINAL/CODEINE #3 Take 1 capsule by mouth  every 4 (four) hours as needed for pain or headache.   cetirizine 10 MG tablet Commonly known as:  ZYRTEC Take 1 tablet (10 mg total) by mouth daily.   clonazePAM 0.5 MG tablet Commonly known as:  KLONOPIN Take 1 tablet (0.5 mg total) by mouth 4 (four) times daily.   esomeprazole 40 MG capsule Commonly known as:  NEXIUM Take 1 capsule (40 mg total) by mouth daily.   FLUoxetine 20 MG capsule Commonly known as:  PROZAC Take 2 capsules (40 mg total) by  mouth daily. 20mg  per day for 2 weeks, then increase to 40mg  per day   HYDROcodone-acetaminophen 10-325 MG tablet Commonly known as:  NORCO Take 1 tablet by mouth every 6 (six) hours as needed.   QUEtiapine 50 MG tablet Commonly known as:  SEROQUEL Take 2 tablets (100 mg total) by mouth 2 (two) times daily as needed (anxiety).   traZODone 100 MG tablet Commonly known as:  DESYREL Take 1 tablet (100 mg total) by mouth at bedtime as needed for sleep.   venlafaxine XR 75 MG 24 hr capsule Commonly known as:  EFFEXOR-XR Take 3 capsules (225 mg total) by mouth daily with breakfast. Taper as directed over 7 weeks   zolpidem 5 MG tablet Commonly known as:  AMBIEN Take 1 tablet (5 mg total) by mouth at bedtime.       If you experience worsening of your admission symptoms, develop shortness of breath, life threatening emergency, suicidal or homicidal thoughts you must seek medical attention immediately by calling 911 or calling your MD immediately  if symptoms less severe.  You Must read complete instructions/literature along with all the possible adverse reactions/side effects for all the Medicines you take and that have been prescribed to you. Take any new Medicines after you have completely understood and accept all the possible adverse reactions/side effects.   Please note  You were cared for by a hospitalist during your hospital stay. If you have any questions about your discharge medications or the care you received while you were in the hospital after you are discharged, you can call the unit and asked to speak with the hospitalist on call if the hospitalist that took care of you is not available. Once you are discharged, your primary care physician will handle any further medical issues. Please note that NO REFILLS for any discharge medications will be authorized once you are discharged, as it is imperative that you return to your primary care physician (or establish a relationship with  a primary care physician if you do not have one) for your aftercare needs so that they can reassess your need for medications and monitor your lab values.  DATA REVIEW:   CBC  Recent Labs  Lab 03/29/18 1722  WBC 14.9*  HGB 13.1  HCT 39.2  PLT 391    Chemistries  Recent Labs  Lab 03/29/18 1722 03/30/18 0054  NA 136  --   K 3.5  --   CL 108  --   CO2 21*  --   GLUCOSE 121*  --   BUN 13  --   CREATININE 0.71  --   CALCIUM 8.7*  --   MG  --  2.0  AST  --  19  ALT  --  17  ALKPHOS  --  72  BILITOT  --  0.4    Microbiology Results   Recent Results (from the past 240 hour(s))  C difficile quick scan w PCR reflex  Status: None   Collection Time: 03/30/18 10:26 PM  Result Value Ref Range Status   C Diff antigen NEGATIVE NEGATIVE Final   C Diff toxin NEGATIVE NEGATIVE Final   C Diff interpretation No C. difficile detected.  Final    Comment: Performed at [facility], [address] [place], [place] [address]  Gastrointestinal Panel by PCR , Stool     Status: None   Collection Time: 03/30/18 10:26 PM  Result Value Ref Range Status   Campylobacter species NOT DETECTED NOT DETECTED Final   Plesimonas shigelloides NOT DETECTED NOT DETECTED Final   Salmonella species NOT DETECTED NOT DETECTED Final   Yersinia enterocolitica NOT DETECTED NOT DETECTED Final   Vibrio species NOT DETECTED NOT DETECTED Final   Vibrio cholerae NOT DETECTED NOT DETECTED Final   Enteroaggregative E coli (EAEC) NOT DETECTED NOT DETECTED Final   Enteropathogenic E coli (EPEC) NOT DETECTED NOT DETECTED Final   Enterotoxigenic E coli (ETEC) NOT DETECTED NOT DETECTED Final   Shiga like toxin producing E coli (STEC) NOT DETECTED NOT DETECTED Final   Shigella/Enteroinvasive E coli (EIEC) NOT DETECTED NOT DETECTED Final   Cryptosporidium NOT DETECTED NOT DETECTED Final   Cyclospora cayetanensis NOT DETECTED NOT DETECTED Final   Entamoeba histolytica NOT DETECTED NOT DETECTED Final    Giardia lamblia NOT DETECTED NOT DETECTED Final   Adenovirus F40/41 NOT DETECTED NOT DETECTED Final   Astrovirus NOT DETECTED NOT DETECTED Final   Norovirus GI/GII NOT DETECTED NOT DETECTED Final   Rotavirus A NOT DETECTED NOT DETECTED Final   Sapovirus (I, II, IV, and V) NOT DETECTED NOT DETECTED Final    Comment: Performed at [facility], [address] [place], [place] [address]    RADIOLOGY:  Dg Chest 2 View  Result Date: 03/29/2018 CLINICAL DATA:  38 year old female with a history of fall EXAM: CHEST - 2 VIEW COMPARISON:  None. FINDINGS: The heart size and mediastinal contours are within normal limits. Both lungs are clear. The visualized skeletal structures are unremarkable. IMPRESSION: Negative for acute cardiopulmonary disease Electronically Signed   By: Corrie Mckusick D.O.   On: 03/29/2018 18:08   Ct Head Wo Contrast  Result Date: 03/29/2018 CLINICAL DATA:  Fall with hallucinations. Reported Huntington's disease EXAM: CT HEAD WITHOUT CONTRAST TECHNIQUE: Contiguous axial images were obtained from the base of the skull through the vertex without intravenous contrast. COMPARISON:  July 14, 2017 FINDINGS: Brain: The ventricles are normal in size and configuration. There is no intracranial mass, hemorrhage, extra-axial fluid collection, or midline shift. Brain parenchyma appears unremarkable. No evident acute infarct. Vascular: No hyperdense vessel.  No evident vascular lesions. Skull: Bony calvarium appears intact. Sinuses/Orbits: There is opacification in portions of the left sphenoid sinus. Other visualized paranasal sinuses are clear. Visualized orbits appear symmetric bilaterally. Other: Mastoid air cells are clear. IMPRESSION: Left sphenoid sinus disease. Brain parenchyma appears unremarkable. No mass or hemorrhage. Electronically Signed   By: Lowella Grip III M.D.   On: 03/29/2018 17:37     CODE STATUS:     Code Status Orders  (From admission, onward)          Start     Ordered   03/29/18 2346  Full code  Continuous     03/29/18 2345        Code Status History    Date Active Date Inactive Code Status Order ID Comments User Context   06/26/2017 1911 06/28/2017 1254 Full Code [id number]  Clapacs, Madie Reno, MD  Inpatient   06/26/2017 1911 06/26/2017 1911 Full Code [id number]  Gonzella Lex, MD Inpatient   06/25/2017 2129 06/26/2017 1903 Full Code [id number]  Demetrios Loll, MD Inpatient   01/17/2015 0759 01/18/2015 2049 Full Code [id number]  Harrie Foreman, MD Inpatient      TOTAL TIME TAKING CARE OF THIS PATIENT: *40* minutes.    Fritzi Mandes M.D on 03/31/2018 at 9:08 AM  Between 7am to 6pm - Pager - [phone] After 6pm go to [email or web address] - password [email or web address] [place] [facility]  Office  [phone]  CC: Primary care physician; No primary care provider on file.

## 2018-03-31 NOTE — Progress Notes (Signed)
Pt is d/ced home.  BP is stabilized.  Pt improved.  Reviewed d/c instructions; IV removed and f/u appts reviewed.

## 2018-03-31 NOTE — Progress Notes (Signed)
[facility] at [place] NAME: Heather King    MR#:  [id number]  DATE OF BIRTH:  May 27, 1980  SUBJECTIVE:   Feels back to baseline doing well. Blood pressure back to normal. REVIEW OF SYSTEMS:   Review of Systems  Constitutional: Negative for chills, fever and weight loss.  HENT: Negative for ear discharge, ear pain and nosebleeds.   Eyes: Negative for blurred vision, pain and discharge.  Respiratory: Negative for sputum production, shortness of breath, wheezing and stridor.   Cardiovascular: Negative for chest pain, palpitations, orthopnea and PND.  Gastrointestinal: Negative for abdominal pain, diarrhea, nausea and vomiting.  Genitourinary: Negative for frequency and urgency.  Musculoskeletal: Negative for back pain and joint pain.  Neurological: Positive for weakness. Negative for sensory change, speech change and focal weakness.  Psychiatric/Behavioral: Negative for depression and hallucinations. The patient is not nervous/anxious.    Tolerating Diet:yes Tolerating PT: ambulating by herself.  DRUG ALLERGIES:   Allergies  Allergen Reactions  . Abilify [Aripiprazole] Other (See Comments)    akathisia  . Gabapentin Rash  . Nifedipine Rash    To cream    VITALS:  Blood pressure 133/65, pulse 75, temperature 98 F (36.7 C), resp. rate 19, height 5\' 2"  (1.575 m), weight 62.6 kg, SpO2 98 %.  PHYSICAL EXAMINATION:   Physical Exam  GENERAL:  38 y.o.-year-old patient lying in the bed with no acute distress.  EYES: Pupils equal, round, reactive to light and accommodation. No scleral icterus. Extraocular muscles intact.  HEENT: Head atraumatic, normocephalic. Oropharynx and nasopharynx clear.  NECK:  Supple, no jugular venous distention. No thyroid enlargement, no tenderness.  LUNGS: Normal breath sounds bilaterally, no wheezing, rales, rhonchi. No use of accessory muscles of respiration.  CARDIOVASCULAR: S1, S2 normal. No murmurs,  rubs, or gallops.  ABDOMEN: Soft, nontender, nondistended. Bowel sounds present. No organomegaly or mass.  EXTREMITIES: No cyanosis, clubbing or edema b/l.    NEUROLOGIC: Cranial nerves II through XII are intact. No focal Motor or sensory deficits b/l. Patient ambulated very well with me. No ataxia or unsteady gait noted. She has some weakness in her right LE (subjective) PSYCHIATRIC:  patient is alert and oriented x 3.  SKIN: No obvious rash, lesion, or ulcer.   LABORATORY PANEL:  CBC Recent Labs  Lab 03/29/18 1722  WBC 14.9*  HGB 13.1  HCT 39.2  PLT 391    Chemistries  Recent Labs  Lab 03/29/18 1722 03/30/18 0054  NA 136  --   K 3.5  --   CL 108  --   CO2 21*  --   GLUCOSE 121*  --   BUN 13  --   CREATININE 0.71  --   CALCIUM 8.7*  --   MG  --  2.0  AST  --  19  ALT  --  17  ALKPHOS  --  72  BILITOT  --  0.4   Cardiac Enzymes Recent Labs  Lab 03/29/18 1722  TROPONINI <0.03   RADIOLOGY:  Dg Chest 2 View  Result Date: 03/29/2018 CLINICAL DATA:  38 year old female with a history of fall EXAM: CHEST - 2 VIEW COMPARISON:  None. FINDINGS: The heart size and mediastinal contours are within normal limits. Both lungs are clear. The visualized skeletal structures are unremarkable. IMPRESSION: Negative for acute cardiopulmonary disease Electronically Signed   By: Corrie Mckusick D.O.   On: 03/29/2018 18:08   Ct Head Wo Contrast  Result Date: 03/29/2018 CLINICAL DATA:  Fall with hallucinations. Reported Huntington's disease EXAM: CT HEAD WITHOUT CONTRAST TECHNIQUE: Contiguous axial images were obtained from the base of the skull through the vertex without intravenous contrast. COMPARISON:  July 14, 2017 FINDINGS: Brain: The ventricles are normal in size and configuration. There is no intracranial mass, hemorrhage, extra-axial fluid collection, or midline shift. Brain parenchyma appears unremarkable. No evident acute infarct. Vascular: No hyperdense vessel.  No evident vascular  lesions. Skull: Bony calvarium appears intact. Sinuses/Orbits: There is opacification in portions of the left sphenoid sinus. Other visualized paranasal sinuses are clear. Visualized orbits appear symmetric bilaterally. Other: Mastoid air cells are clear. IMPRESSION: Left sphenoid sinus disease. Brain parenchyma appears unremarkable. No mass or hemorrhage. Electronically Signed   By: Lowella Grip III M.D.   On: 03/29/2018 17:37   ASSESSMENT AND PLAN:   Heather King is a 38 y.o. female who history of Huntington's disease, bipolar disorder, anxiety, on Klonopin, history of multiple muscle disorders including right lower extremity weakness for "months" presents today complaining of having a episode of syncope.  She was watching Dr. Abbe Amsterdam on TV she got up to go to the kitchen, and she passed out. Patient was found to have low blood pressure in the hospital emergency room and positive orthostatic's  1. Syncopal episode suspected due to orthostatic hypotension -CT had negative other than left sphenoid sinusitis. -Patient received IV fluids. She remains sinus rhythm on telemetry. Her orthostatic vital signs checked by me this afternoon will within normal limits. -Patient did ambulate with me without any difficulty. -Asked her to continue oral hydration. -Blood pressure today 133/65, HR 70  2. History of Huntington's chorea -patient was following up with [place] neurology however she stopped following them due to insurance issues. She has insurance now and I discussed with her to follow-up with [facility] neurology Dr Doralee Albino. I offered her to see if she wanted to see neurology here and get possible EEG she declined. She states she feels better at present and will follow-up with [facility] neurology as outpatient  3. Bipolar disorder continue her meds patient follows with Dr. Weber Cooks  4. DVT prophylaxis subcu Lovenox  Patient says she is ready for discharge.    CODE STATUS: full  DVT  Prophylaxis: **Lovenox*  TOTAL TIME TAKING CARE OF THIS PATIENT: *25* minutes.  >50% time spent on counselling and coordination of care  POSSIBLE D/C IN *one** DAYS, DEPENDING ON CLINICAL CONDITION.  Note: This dictation was prepared with Dragon dictation along with smaller phrase technology. Any transcriptional errors that result from this process are unintentional.  Fritzi Mandes M.D on 03/31/2018 at 9:03 AM  Between 7am to 6pm - Pager - [phone]  After 6pm go to [email or web address] - password [email or web address] [facility]  Office  [phone]  CC: Primary care physician; No primary care provider on file.Patient ID: Heather King, female   DOB: 1981/01/18, 38 y.o.   MRN: [id number]

## 2018-03-31 NOTE — Care Management (Signed)
No discharge needs identified by members of the care team 

## 2018-04-20 ENCOUNTER — Ambulatory Visit: Payer: No Typology Code available for payment source | Admitting: Psychiatry

## 2018-05-04 ENCOUNTER — Ambulatory Visit (INDEPENDENT_AMBULATORY_CARE_PROVIDER_SITE_OTHER): Payer: Medicaid Other | Admitting: Psychiatry

## 2018-05-04 ENCOUNTER — Other Ambulatory Visit: Payer: Self-pay

## 2018-05-04 ENCOUNTER — Encounter: Payer: Self-pay | Admitting: Psychiatry

## 2018-05-04 VITALS — BP 116/83 | HR 103 | Temp 98.3°F | Wt 160.4 lb

## 2018-05-04 DIAGNOSIS — G10 Huntington's disease: Secondary | ICD-10-CM

## 2018-05-04 MED ORDER — AMPHETAMINE-DEXTROAMPHETAMINE 20 MG PO TABS: 20.0000 mg | ORAL_TABLET | Freq: Two times a day (BID) | ORAL | 0 refills | Status: DC

## 2018-05-04 MED ORDER — QUETIAPINE FUMARATE 50 MG PO TABS: 100.0000 mg | ORAL_TABLET | Freq: Two times a day (BID) | ORAL | 2 refills | Status: DC | PRN

## 2018-05-04 MED ORDER — BUTALBITAL-ASA-CAFF-CODEINE 50-325-40-30 MG PO CAPS: 1.0000 | ORAL_CAPSULE | ORAL | 3 refills | Status: DC | PRN

## 2018-05-04 MED ORDER — TRAZODONE HCL 100 MG PO TABS: 100.0000 mg | ORAL_TABLET | Freq: Every evening | ORAL | 2 refills | Status: DC | PRN

## 2018-05-04 MED ORDER — ZOLPIDEM TARTRATE 5 MG PO TABS: 5.0000 mg | ORAL_TABLET | Freq: Every day | ORAL | 3 refills | Status: DC

## 2018-05-04 MED ORDER — CLONAZEPAM 0.5 MG PO TABS: 0.5000 mg | ORAL_TABLET | Freq: Four times a day (QID) | ORAL | 2 refills | Status: DC

## 2018-05-04 MED ORDER — PANTOPRAZOLE SODIUM 40 MG PO TBEC: 40.0000 mg | DELAYED_RELEASE_TABLET | Freq: Every day | ORAL | 3 refills | Status: DC

## 2018-05-04 MED ORDER — VENLAFAXINE HCL ER 75 MG PO CP24: 75.0000 mg | ORAL_CAPSULE | Freq: Every day | ORAL | 3 refills | Status: DC

## 2018-05-04 MED ORDER — VENLAFAXINE HCL ER 150 MG PO CP24: 300.0000 mg | ORAL_CAPSULE | Freq: Every day | ORAL | 3 refills | Status: DC

## 2018-05-04 NOTE — Progress Notes (Signed)
Follow-up for this patient with chronic symptoms of depression and anxiety.  She tells me that the Prozac did not work for her.  She went to the 40 mg dose and it has made her feel by her estimate more depressed and irritable at times.  Still feeling very anxious.  On the other hand she is sleeping much better.  Nerves during the day are under okay control.  Not having any active suicidal thoughts.  Not having any active psychosis.  She is concerned about gaining weight.  Patient seen chart reviewed.  Neatly dressed and groomed.  Good eye contact and normal psychomotor activity.  Speech normal rate tone and volume.  Affect euthymic.  Denies suicidal or homicidal ideation.  Although the patient says she is feeling much more depressed she is fortunately looking like she is functioning okay.  We reviewed various possible options for treatment.  I suggest that we discontinue the Prozac and just go back to Effexor she is in agreement with that.  We will try to go up to 375 mg if possible as she had no problems tolerating 300 before.  Continue other medicines as prescribed.  Follow-up in 3 months.

## 2018-05-14 ENCOUNTER — Telehealth: Payer: Self-pay

## 2018-05-14 NOTE — Telephone Encounter (Signed)
pt wants to speak with you about changing her medication she states that she has gained 30 pounds.

## 2018-05-17 NOTE — Telephone Encounter (Signed)
Taken care of

## 2018-05-17 NOTE — Telephone Encounter (Signed)
pt called and left a message about wanting a diet medication. states that she discussed with her last visit.

## 2018-08-03 ENCOUNTER — Other Ambulatory Visit: Payer: Self-pay | Admitting: Psychiatry

## 2018-08-03 MED ORDER — AMPHETAMINE-DEXTROAMPHETAMINE 20 MG PO TABS: 20.0000 mg | ORAL_TABLET | Freq: Two times a day (BID) | ORAL | 0 refills | Status: DC

## 2018-08-03 MED ORDER — QUETIAPINE FUMARATE 50 MG PO TABS: 100.0000 mg | ORAL_TABLET | Freq: Two times a day (BID) | ORAL | 2 refills | Status: DC | PRN

## 2018-08-05 ENCOUNTER — Other Ambulatory Visit: Payer: Self-pay

## 2018-08-05 ENCOUNTER — Encounter: Payer: Self-pay | Admitting: Psychiatry

## 2018-08-05 ENCOUNTER — Ambulatory Visit (INDEPENDENT_AMBULATORY_CARE_PROVIDER_SITE_OTHER): Payer: Medicaid Other | Admitting: Psychiatry

## 2018-08-05 DIAGNOSIS — G10 Huntington's disease: Secondary | ICD-10-CM

## 2018-08-05 MED ORDER — PANTOPRAZOLE SODIUM 40 MG PO TBEC: 40.0000 mg | DELAYED_RELEASE_TABLET | Freq: Every day | ORAL | 3 refills | Status: DC

## 2018-08-05 MED ORDER — BUTALBITAL-ASA-CAFF-CODEINE 50-325-40-30 MG PO CAPS: 1.0000 | ORAL_CAPSULE | ORAL | 3 refills | Status: DC | PRN

## 2018-08-05 MED ORDER — ZOLPIDEM TARTRATE 5 MG PO TABS: 5.0000 mg | ORAL_TABLET | Freq: Every day | ORAL | 3 refills | Status: DC

## 2018-08-05 MED ORDER — QUETIAPINE FUMARATE 200 MG PO TABS: 200.0000 mg | ORAL_TABLET | Freq: Every day | ORAL | 3 refills | Status: DC

## 2018-08-05 MED ORDER — VENLAFAXINE HCL ER 150 MG PO CP24: 300.0000 mg | ORAL_CAPSULE | Freq: Every day | ORAL | 3 refills | Status: DC

## 2018-08-05 MED ORDER — VENLAFAXINE HCL ER 75 MG PO CP24: 75.0000 mg | ORAL_CAPSULE | Freq: Every day | ORAL | 3 refills | Status: DC

## 2018-08-05 MED ORDER — TRAZODONE HCL 100 MG PO TABS: 100.0000 mg | ORAL_TABLET | Freq: Every evening | ORAL | 2 refills | Status: DC | PRN

## 2018-08-05 MED ORDER — CLONAZEPAM 0.5 MG PO TABS: 0.5000 mg | ORAL_TABLET | Freq: Four times a day (QID) | ORAL | 2 refills | Status: DC

## 2018-08-05 NOTE — Progress Notes (Signed)
Follow-up for this patient with chronic anxiety and mood symptoms as well as cognitive problems.  Patient was reached by telephone.  She was appropriate and forthcoming in the interview.  Affect sounded normal and reactive.  Thoughts lucid.  No sign of delusions or confusion or delirium.  Denied suicidal thoughts.  Patient reports that overall mood is been pretty good.  She is having some trouble sleeping at night and would like to consider increasing the dose of Seroquel.  Patient has recently had some worsening leg pain and side pain which seems to be related to Huntington's disease.  Has not had any major mood swings.  No suicidal thoughts.  Tolerating medicine well including the Adderall.  Plan is to increase the dose of Seroquel.  Continue other medicines.  Prescriptions will be put in for follow-up in another 3 months.

## 2018-09-16 ENCOUNTER — Telehealth: Payer: Self-pay | Admitting: Pharmacy Technician

## 2018-09-16 NOTE — Telephone Encounter (Signed)
Received 2020 proof of income.  Patient eligible to receive medication assistance at [facility] as long as eligibility requirements continue to be met.  [facility]

## 2018-10-07 ENCOUNTER — Other Ambulatory Visit: Payer: Medicaid Other | Admitting: Pharmacist

## 2018-11-09 ENCOUNTER — Other Ambulatory Visit: Payer: Self-pay | Admitting: Psychiatry

## 2018-11-09 MED ORDER — VENLAFAXINE HCL ER 75 MG PO CP24: 75.0000 mg | ORAL_CAPSULE | Freq: Every day | ORAL | 3 refills | Status: DC

## 2018-11-09 MED ORDER — TRAZODONE HCL 100 MG PO TABS: 100.0000 mg | ORAL_TABLET | Freq: Every evening | ORAL | 2 refills | Status: DC | PRN

## 2018-11-09 MED ORDER — CLONAZEPAM 0.5 MG PO TABS: 0.5000 mg | ORAL_TABLET | Freq: Four times a day (QID) | ORAL | 2 refills | Status: DC

## 2018-11-09 MED ORDER — AMPHETAMINE-DEXTROAMPHETAMINE 20 MG PO TABS: 20.0000 mg | ORAL_TABLET | Freq: Two times a day (BID) | ORAL | 0 refills | Status: DC

## 2018-11-09 MED ORDER — ZOLPIDEM TARTRATE 5 MG PO TABS: 5.0000 mg | ORAL_TABLET | Freq: Every day | ORAL | 3 refills | Status: DC

## 2018-11-09 MED ORDER — QUETIAPINE FUMARATE 200 MG PO TABS: 200.0000 mg | ORAL_TABLET | Freq: Every day | ORAL | 3 refills | Status: DC

## 2018-11-09 MED ORDER — BUTALBITAL-ASA-CAFF-CODEINE 50-325-40-30 MG PO CAPS: 1.0000 | ORAL_CAPSULE | ORAL | 3 refills | Status: DC | PRN

## 2018-11-09 MED ORDER — VENLAFAXINE HCL ER 150 MG PO CP24: 300.0000 mg | ORAL_CAPSULE | Freq: Every day | ORAL | 3 refills | Status: DC

## 2018-11-16 ENCOUNTER — Other Ambulatory Visit: Payer: Self-pay

## 2018-11-16 ENCOUNTER — Ambulatory Visit (INDEPENDENT_AMBULATORY_CARE_PROVIDER_SITE_OTHER): Payer: Medicare HMO | Admitting: Psychiatry

## 2018-11-16 ENCOUNTER — Encounter: Payer: Self-pay | Admitting: Psychiatry

## 2018-11-16 DIAGNOSIS — G10 Huntington's disease: Secondary | ICD-10-CM | POA: Diagnosis not present

## 2018-11-16 MED ORDER — AMPHETAMINE-DEXTROAMPHETAMINE 20 MG PO TABS: 20.0000 mg | ORAL_TABLET | Freq: Two times a day (BID) | ORAL | 0 refills | Status: DC

## 2018-11-16 NOTE — Progress Notes (Signed)
Follow-up for this patient with Huntington's disease multiple symptomatologies.  Patient is having some pain and trouble moving 1 of her legs but is planning to see her primary care doctor soon.  We discussed possible referral to a neurologist but since she is seeing a primary care doctor next week I think it would be best to start there.  Meanwhile mood and anxiety stay about the same cognitive functioning about the same.  No new mental health complaints.  Alert and oriented.  Appropriate interaction.  Affect euthymic.  Denies suicidal or homicidal ideation.  No sign of psychosis.  Continue all medicines including multiple controlled substances.  Reviewed risks and benefits of medication.  Patient asked me whether I think the Seroquel could be causing her to gain weight and I agreed that it probably was but she prefers to stay with it for now because it is uniquely helpful for her sleep.  Follow-up in another 3 months.

## 2018-11-18 DIAGNOSIS — L720 Epidermal cyst: Secondary | ICD-10-CM | POA: Diagnosis not present

## 2018-11-23 ENCOUNTER — Ambulatory Visit: Payer: Medicare HMO | Admitting: Internal Medicine

## 2018-11-25 ENCOUNTER — Encounter: Payer: Self-pay | Admitting: Internal Medicine

## 2018-11-25 ENCOUNTER — Ambulatory Visit (INDEPENDENT_AMBULATORY_CARE_PROVIDER_SITE_OTHER): Payer: Medicare HMO | Admitting: Internal Medicine

## 2018-11-25 ENCOUNTER — Other Ambulatory Visit (INDEPENDENT_AMBULATORY_CARE_PROVIDER_SITE_OTHER): Payer: Medicare HMO

## 2018-11-25 ENCOUNTER — Ambulatory Visit (INDEPENDENT_AMBULATORY_CARE_PROVIDER_SITE_OTHER)
Admission: RE | Admit: 2018-11-25 | Discharge: 2018-11-25 | Disposition: A | Discharge status: Home / Self Care | Payer: Medicare HMO | Source: Ambulatory Visit | Attending: Internal Medicine | Admitting: Internal Medicine

## 2018-11-25 ENCOUNTER — Other Ambulatory Visit: Payer: Self-pay

## 2018-11-25 VITALS — BP 120/82 | HR 103 | Temp 98.8°F | Ht 62.0 in | Wt 177.0 lb

## 2018-11-25 DIAGNOSIS — M79605 Pain in left leg: Secondary | ICD-10-CM | POA: Diagnosis not present

## 2018-11-25 DIAGNOSIS — G10 Huntington's disease: Secondary | ICD-10-CM

## 2018-11-25 DIAGNOSIS — R7989 Other specified abnormal findings of blood chemistry: Secondary | ICD-10-CM

## 2018-11-25 DIAGNOSIS — K219 Gastro-esophageal reflux disease without esophagitis: Secondary | ICD-10-CM

## 2018-11-25 DIAGNOSIS — M545 Low back pain, unspecified: Secondary | ICD-10-CM

## 2018-11-25 DIAGNOSIS — G4701 Insomnia due to medical condition: Secondary | ICD-10-CM

## 2018-11-25 DIAGNOSIS — F909 Attention-deficit hyperactivity disorder, unspecified type: Secondary | ICD-10-CM

## 2018-11-25 DIAGNOSIS — G8929 Other chronic pain: Secondary | ICD-10-CM

## 2018-11-25 DIAGNOSIS — R519 Headache, unspecified: Secondary | ICD-10-CM | POA: Diagnosis not present

## 2018-11-25 DIAGNOSIS — R635 Abnormal weight gain: Secondary | ICD-10-CM

## 2018-11-25 DIAGNOSIS — M79604 Pain in right leg: Secondary | ICD-10-CM | POA: Diagnosis not present

## 2018-11-25 DIAGNOSIS — E039 Hypothyroidism, unspecified: Secondary | ICD-10-CM

## 2018-11-25 DIAGNOSIS — R69 Illness, unspecified: Secondary | ICD-10-CM | POA: Diagnosis not present

## 2018-11-25 DIAGNOSIS — F411 Generalized anxiety disorder: Secondary | ICD-10-CM

## 2018-11-25 DIAGNOSIS — F332 Major depressive disorder, recurrent severe without psychotic features: Secondary | ICD-10-CM

## 2018-11-25 LAB — VITAMIN D 25 HYDROXY (VIT D DEFICIENCY, FRACTURES): VITD: 48.01 ng/mL (ref 30.00–100.00)

## 2018-11-25 LAB — TSH: TSH: 9.17 u[IU]/mL — ABNORMAL HIGH (ref 0.35–4.50)

## 2018-11-25 LAB — VITAMIN B12: Vitamin B-12: 282 pg/mL (ref 211–911)

## 2018-11-25 LAB — CK: Total CK: 75 U/L (ref 7–177)

## 2018-11-25 LAB — T3, FREE: T3, Free: 3.2 pg/mL (ref 2.3–4.2)

## 2018-11-25 LAB — FOLATE: Folate: 17.9 ng/mL (ref >5.9)

## 2018-11-25 LAB — T4, FREE: Free T4: 0.73 ng/dL (ref 0.60–1.60)

## 2018-11-25 MED ORDER — PANTOPRAZOLE SODIUM 40 MG PO TBEC: 40.0000 mg | DELAYED_RELEASE_TABLET | Freq: Two times a day (BID) | ORAL | 3 refills | Status: DC

## 2018-11-25 NOTE — Assessment & Plan Note (Signed)
Persistent Controlled on Seroquel, Clonazepam and Effexor She will continue to follow with Behavior [facility]

## 2018-11-25 NOTE — Assessment & Plan Note (Signed)
Likely needs preventative therapy Can continue [place] prn but I will not fill She is establishing care with a new neurologist in 2 weeks

## 2018-11-25 NOTE — Assessment & Plan Note (Signed)
Establishing with neurology in the next 2 weeks

## 2018-11-25 NOTE — Assessment & Plan Note (Signed)
Discussed how weight loss and avoiding foods that trigger reflux Some breakthrough on Pantoprazole Consider checking for H Pylori vs referral to GI for upper GI

## 2018-11-25 NOTE — Assessment & Plan Note (Signed)
Continue Adderall per [facility]

## 2018-11-25 NOTE — Progress Notes (Signed)
HPI  Pt presents to the clinic today to establish care and for management of the conditions listed below. She is transferring care from [facility] (where she was dismissed due to + cocaine on UDS).  ADD: Managed on Adderall. She follows with Dr. Norville Haggard at [facility].  Anxiety and Bipolar Depression: Triggered by driving, social situations. She is managed on Seroquel, Clonazepam and Effexor. She sees a therapist. She follows with Dr. Norville Haggard  Chronic Headaches: These occur 3-4 times per week. She is not sure what triggers this. She takes Florinal as needed with good relief of symptoms. She is not following with neurology but has an appt scheduled in 2 weeks.   GERD: Triggered by weight gain, tomato based foods. She does have breakthrough on Pantoprazole 40 BID. There is no upper GI on file.   Huntington's Disease: Diagnosed 4 years ago. She has scheduled an appt with neurologist at [facility] in 2 weeks.  Insomnia: She has trouble falling asleep and staying asleep. She is taking Seroquel and Ambien as prescribed by Dr. Norville Haggard. There is no sleep study on file.   Flu: 11/2018 Tetanus: ? 2015 Pap Smear: Partial Hysterectomy 2012 Dentist: as needed  Past Medical History:  Diagnosis Date  . Acute headache   . ADD (attention deficit disorder)   . Anxiety   . Aseptic meningitis   . Bipolar disorder ([place])   . Breast discharge x 6 months   Bilateral c pain  . Cancer (HCC)    Cervical Cancer  . Cervical pain   . Chronic pain syndrome   . Chronic tension type headache    not intractable  . Depression   . GERD (gastroesophageal reflux disease)   . History of cervical dysplasia   . Huntington disease ([place])   . Huntington's disease ([place])   . Insomnia due to medical condition   . Loss of memory   . Mild neurocognitive disorder    attention and concentration problemss  . Rectal bleeding   . Rectal pain   . Seizure-like activity ([place])     Current Outpatient  Medications  Medication Sig Dispense Refill  . amphetamine-dextroamphetamine (ADDERALL) 20 MG tablet Take 1 tablet (20 mg total) by mouth 2 (two) times daily with a meal. 60 tablet 0  . amphetamine-dextroamphetamine (ADDERALL) 20 MG tablet Take 1 tablet (20 mg total) by mouth 2 (two) times daily with a meal. 60 tablet 0  . amphetamine-dextroamphetamine (ADDERALL) 20 MG tablet Take 1 tablet (20 mg total) by mouth 2 (two) times daily. 60 tablet 0  . butalbital-aspirin-caffeine-codeine (FIORINAL/CODEINE #3) 50-325-40-30 MG capsule Take 1 capsule by mouth every 4 (four) hours as needed for pain or headache. 120 capsule 3  . cetirizine (ZYRTEC) 10 MG tablet Take 1 tablet (10 mg total) by mouth daily. 30 tablet 11  . clonazePAM (KLONOPIN) 0.5 MG tablet Take 1 tablet (0.5 mg total) by mouth 4 (four) times daily. 120 tablet 2  . esomeprazole (NEXIUM) 40 MG capsule Take 1 capsule (40 mg total) by mouth daily. 30 capsule 3  . HYDROcodone-acetaminophen (NORCO) 10-325 MG tablet Take 1 tablet by mouth every 6 (six) hours as needed.    . pantoprazole (PROTONIX) 40 MG tablet Take 1 tablet (40 mg total) by mouth daily. 30 tablet 3  . QUEtiapine (SEROQUEL) 200 MG tablet Take 1 tablet (200 mg total) by mouth at bedtime. 30 tablet 3  . traZODone (DESYREL) 100 MG tablet Take 1 tablet (100 mg total) by mouth at bedtime  as needed for sleep. 30 tablet 2  . venlafaxine XR (EFFEXOR-XR) 150 MG 24 hr capsule Take 2 capsules (300 mg total) by mouth daily with breakfast. Taper as directed over 7 weeks 60 capsule 3  . venlafaxine XR (EFFEXOR-XR) 75 MG 24 hr capsule Take 1 capsule (75 mg total) by mouth daily with breakfast. 30 capsule 3  . zolpidem (AMBIEN) 5 MG tablet Take 1 tablet (5 mg total) by mouth at bedtime. 30 tablet 3   No current facility-administered medications for this visit.     Allergies  Allergen Reactions  . Abilify [Aripiprazole] Other (See Comments)    akathisia  . Gabapentin Rash  . Nifedipine Rash     To cream    Family History  Problem Relation Age of Onset  . Hypertension Mother   . Diabetes Mother   . Mental illness Mother   . Depression Mother   . Cancer Father        lung  . Alcohol abuse Father   . Dementia Maternal Grandmother   . Cancer Maternal Grandmother        breast  . Dementia Maternal Grandfather   . Cancer Maternal Grandfather        Lung  . Diabetes Brother   . ADD / ADHD Brother   . Breast cancer Maternal Aunt 59    Social History   Socioeconomic History  . Marital status: Married    Spouse name: Not on file  . Number of children: 1  . Years of education: Not on file  . Highest education level: Some college, no degree  Occupational History  . Not on file  Social Needs  . Financial resource strain: Not hard at all  . Food insecurity    Worry: Never true    Inability: Never true  . Transportation needs    Medical: No    Non-medical: No  Tobacco Use  . Smoking status: Never Smoker  . Smokeless tobacco: Never Used  Substance and Sexual Activity  . Alcohol use: Not Currently    Comment: On occasion  . Drug use: No  . Sexual activity: Not on file  Lifestyle  . Physical activity    Days per week: Patient refused    Minutes per session: Patient refused  . Stress: To some extent  Relationships  . Social Herbalist on phone: Not on file    Gets together: Not on file    Attends religious service: Not on file    Active member of club or organization: Not on file    Attends meetings of clubs or organizations: Not on file    Relationship status: Not on file  . Intimate partner violence    Fear of current or ex partner: No    Emotionally abused: No    Physically abused: No    Forced sexual activity: No  Other Topics Concern  . Not on file  Social History Narrative  . Not on file    ROS:  Constitutional: Pt reports fatigue, headaches and weight gain. Denies fever, malaise.  HEENT: Denies eye pain, eye redness, ear pain,  ringing in the ears, wax buildup, runny nose, nasal congestion, bloody nose, or sore throat. Respiratory: Denies difficulty breathing, shortness of breath, cough or sputum production.   Cardiovascular: Pt reports intermittent swelling in her feet. Denies chest pain, chest tightness, palpitations or swelling in the hands Gastrointestinal: Pt reports reflux. Denies abdominal pain, bloating, constipation, diarrhea or blood in the  stool.  GU: Denies frequency, urgency, pain with urination, blood in urine, odor or discharge. Musculoskeletal: Pt reports low back pain, radiating into her legs. Denies decrease in range of motion, difficulty with gait, or joint swelling.  Skin: Denies redness, rashes, lesions or ulcercations.  Neurological: Pt reports insomnia, difficulty with memory. Denies dizziness, difficulty with speech or problems with balance and coordination.  Psych: Pt has a history of anxiety and depression. Denies SI/HI.  No other specific complaints in a complete review of systems (except as listed in HPI above).  PE: BP 120/82   Pulse (!) 103   Temp 98.8 F (37.1 C) (Temporal)   Ht 5\' 2"  (1.575 m)   Wt 177 lb (80.3 kg)   BMI 32.37 kg/m   Wt Readings from Last 3 Encounters:  03/29/18 138 lb (62.6 kg)  07/14/17 145 lb (65.8 kg)  07/02/17 149 lb 9 oz (67.8 kg)    General: Appears her stated age, obese, in NAD. Neck: Neck supple, trachea midline. No masses, lumps or thyromegaly present.  Cardiovascular: Tachycardic with normal rhythm. S1,S2 noted.  No murmur, rubs or gallops noted. No JVD or BLE edema.  Pulmonary/Chest: Normal effort and positive vesicular breath sounds. No respiratory distress. No wheezes, rales or ronchi noted.  Abdomen: Soft and mildly tender in the epigastric region. Normal bowel sounds. No distention or masses noted. Liver, spleen and kidneys non palpable. Musculoskeletal: Normal flexion, extension and rotation of the spine. Bony tenderness noted over the lumbar  spine.  No difficulty with gait.  Neurological: Alert and oriented. Negative SLR bilaterally. Psychiatric: Fidgety and anxious. Appears well groomed.     BMET    Component Value Date/Time   NA 136 03/29/2018 1722   NA 140 12/11/2016 1516   K 3.5 03/29/2018 1722   CL 108 03/29/2018 1722   CO2 21 (L) 03/29/2018 1722   GLUCOSE 121 (H) 03/29/2018 1722   BUN 13 03/29/2018 1722   BUN 13 12/11/2016 1516   CREATININE 0.71 03/29/2018 1722   CALCIUM 8.7 (L) 03/29/2018 1722   GFRNONAA >60 03/29/2018 1722   GFRAA >60 03/29/2018 1722    Lipid Panel     Component Value Date/Time   CHOL 157 06/27/2017 0659   TRIG 242 (H) 06/27/2017 0659   HDL 55 06/27/2017 0659   CHOLHDL 2.9 06/27/2017 0659   VLDL 48 (H) 06/27/2017 0659   LDLCALC 54 06/27/2017 0659    CBC    Component Value Date/Time   WBC 14.9 (H) 03/29/2018 1722   RBC 4.13 03/29/2018 1722   HGB 13.1 03/29/2018 1722   HGB 12.3 12/11/2016 1516   HCT 39.2 03/29/2018 1722   HCT 37.7 12/11/2016 1516   PLT 391 03/29/2018 1722   PLT 309 12/11/2016 1516   MCV 94.9 03/29/2018 1722   MCV 96 12/11/2016 1516   MCV 98 06/04/2011 1151   MCH 31.7 03/29/2018 1722   MCHC 33.4 03/29/2018 1722   RDW 12.6 03/29/2018 1722   RDW 13.1 12/11/2016 1516   RDW 12.5 06/04/2011 1151   LYMPHSABS 1.9 12/11/2016 1516   EOSABS 0.1 12/11/2016 1516   BASOSABS 0.0 12/11/2016 1516    Hgb A1C Lab Results  Component Value Date   HGBA1C 4.6 (L) 06/27/2017     Assessment and Plan:  Chronic Low Back Pain with Bilateral Leg Pain:  Will obtain xray of lumbar spine today Will obtain Vit D, B12, Folate May need EMG testing of BLE She is requesting narcotic pain medication- I declined  to fill at this time Encouraged ice, massage, back exercises and core stregthening  Abnormal Weight Gain:  She is asking for Phentermine, I declined to fill due to risk for Serotonin Syndrome TSH today  Make an appt for your annual exam Webb Silversmith, NP

## 2018-11-25 NOTE — Patient Instructions (Signed)

## 2018-11-25 NOTE — Assessment & Plan Note (Signed)
Controlled on Seroquel and Ambien She will continue to follow with Behavioral Health

## 2018-11-25 NOTE — Assessment & Plan Note (Signed)
Persistent Controlled on Seroquel, Clonazepam and Effexor She will continue to follow with Behavioral Health.

## 2018-11-26 ENCOUNTER — Encounter: Payer: Self-pay | Admitting: Internal Medicine

## 2018-11-26 MED ORDER — LEVOTHYROXINE SODIUM 25 MCG PO TABS: 25.0000 ug | ORAL_TABLET | Freq: Every day | ORAL | 1 refills | Status: DC

## 2018-11-26 NOTE — Addendum Note (Signed)
Addended by: Jearld Fenton on: 11/26/2018 01:51 PM   Modules accepted: Orders

## 2018-12-03 ENCOUNTER — Encounter: Payer: Self-pay | Admitting: Internal Medicine

## 2018-12-07 MED ORDER — PANTOPRAZOLE SODIUM 40 MG PO TBEC: 40.0000 mg | DELAYED_RELEASE_TABLET | Freq: Two times a day (BID) | ORAL | 3 refills | Status: DC

## 2018-12-07 NOTE — Addendum Note (Signed)
Addended by: Jearld Fenton on: 12/07/2018 08:15 AM   Modules accepted: Orders

## 2018-12-15 ENCOUNTER — Other Ambulatory Visit: Payer: Medicare HMO

## 2018-12-16 ENCOUNTER — Other Ambulatory Visit (INDEPENDENT_AMBULATORY_CARE_PROVIDER_SITE_OTHER): Payer: Medicare HMO

## 2018-12-16 ENCOUNTER — Other Ambulatory Visit: Payer: Self-pay

## 2018-12-16 DIAGNOSIS — L720 Epidermal cyst: Secondary | ICD-10-CM | POA: Diagnosis not present

## 2018-12-16 DIAGNOSIS — E039 Hypothyroidism, unspecified: Secondary | ICD-10-CM

## 2018-12-16 DIAGNOSIS — R7989 Other specified abnormal findings of blood chemistry: Secondary | ICD-10-CM

## 2018-12-16 DIAGNOSIS — L7211 Pilar cyst: Secondary | ICD-10-CM | POA: Diagnosis not present

## 2018-12-17 ENCOUNTER — Encounter: Payer: Self-pay | Admitting: Internal Medicine

## 2018-12-17 DIAGNOSIS — E039 Hypothyroidism, unspecified: Secondary | ICD-10-CM

## 2018-12-17 LAB — TSH: TSH: 12.53 u[IU]/mL — ABNORMAL HIGH (ref 0.35–4.50)

## 2018-12-17 MED ORDER — LEVOTHYROXINE SODIUM 50 MCG PO TABS: 50.0000 ug | ORAL_TABLET | Freq: Every day | ORAL | 0 refills | Status: DC

## 2018-12-17 NOTE — Addendum Note (Signed)
Addended by: Lurlean Nanny on: 12/17/2018 04:40 PM   Modules accepted: Orders

## 2018-12-22 ENCOUNTER — Encounter: Payer: Self-pay | Admitting: Internal Medicine

## 2018-12-22 DIAGNOSIS — R635 Abnormal weight gain: Secondary | ICD-10-CM

## 2018-12-27 ENCOUNTER — Other Ambulatory Visit: Payer: Medicare HMO | Admitting: Pharmacist

## 2019-01-11 ENCOUNTER — Other Ambulatory Visit: Payer: Medicare HMO

## 2019-01-17 ENCOUNTER — Encounter: Payer: Self-pay | Admitting: Internal Medicine

## 2019-01-21 ENCOUNTER — Encounter: Payer: Self-pay | Admitting: Internal Medicine

## 2019-01-21 ENCOUNTER — Ambulatory Visit (INDEPENDENT_AMBULATORY_CARE_PROVIDER_SITE_OTHER): Payer: Medicare HMO | Admitting: Internal Medicine

## 2019-01-21 ENCOUNTER — Other Ambulatory Visit: Payer: Self-pay

## 2019-01-21 VITALS — BP 120/76 | HR 102 | Temp 98.2°F | Wt 177.0 lb

## 2019-01-21 DIAGNOSIS — R519 Headache, unspecified: Secondary | ICD-10-CM | POA: Diagnosis not present

## 2019-01-21 DIAGNOSIS — R590 Localized enlarged lymph nodes: Secondary | ICD-10-CM | POA: Diagnosis not present

## 2019-01-21 DIAGNOSIS — E039 Hypothyroidism, unspecified: Secondary | ICD-10-CM | POA: Diagnosis not present

## 2019-01-21 DIAGNOSIS — R635 Abnormal weight gain: Secondary | ICD-10-CM | POA: Diagnosis not present

## 2019-01-21 MED ORDER — KETOROLAC TROMETHAMINE 30 MG/ML IJ SOLN
30.0000 mg | Freq: Once | INTRAMUSCULAR | Status: AC
  Administered 2019-01-21: 30 mg via INTRAMUSCULAR

## 2019-01-21 NOTE — Patient Instructions (Signed)

## 2019-01-21 NOTE — Progress Notes (Signed)
Subjective:    Patient ID: Heather King, female    DOB: Jan 23, 1981, 38 y.o.   MRN: [id number]  HPI  Pt presents to the clinic today with c/o "knots" in her neck. She noticed this about 1 month ago.  She reports they are tender to touch but have not gotten any bigger in size.  She reports headache that started 2 to 3 days ago.  The located in her forehead.  She describes the pain as pressure.  She denies dizziness, visual changes, sensitivity to light or sound, nausea or vomiting.  She denies eye redness, eye drainage, runny nose, nasal congestion, ear pain, sore throat, cough or shortness of breath.  She denies fever, chills or body aches.  She has not taken any medication OTC for this.  She is also due to have her repeat TSH today.  Her Levothyroxine was increased to 50 mcg at her last visit.  She also wants to have her A1c done today to see if she can start [place] for abnormal weight gain.  Review of Systems  Past Medical History:  Diagnosis Date  . ADD (attention deficit disorder)   . Anxiety   . Bipolar disorder ([place])   . Cancer (HCC)    Cervical Cancer  . Depression   . GERD (gastroesophageal reflux disease)   . Huntington's disease ([place])   . Insomnia due to medical condition     Current Outpatient Medications  Medication Sig Dispense Refill  . amphetamine-dextroamphetamine (ADDERALL) 20 MG tablet Take 1 tablet (20 mg total) by mouth 2 (two) times daily with a meal. 60 tablet 0  . amphetamine-dextroamphetamine (ADDERALL) 20 MG tablet Take 1 tablet (20 mg total) by mouth 2 (two) times daily. 60 tablet 0  . butalbital-aspirin-caffeine-codeine (FIORINAL/CODEINE #3) 50-325-40-30 MG capsule Take 1 capsule by mouth every 4 (four) hours as needed for pain or headache. 120 capsule 3  . cetirizine (ZYRTEC) 10 MG tablet Take 1 tablet (10 mg total) by mouth daily. 30 tablet 11  . clonazePAM (KLONOPIN) 0.5 MG tablet Take 1 tablet (0.5 mg total) by mouth 4 (four) times daily. 120  tablet 2  . levothyroxine (SYNTHROID) 50 MCG tablet Take 1 tablet (50 mcg total) by mouth daily. 30 tablet 0  . pantoprazole (PROTONIX) 40 MG tablet Take 1 tablet (40 mg total) by mouth 2 (two) times daily. 60 tablet 3  . QUEtiapine (SEROQUEL) 200 MG tablet Take 1 tablet (200 mg total) by mouth at bedtime. 30 tablet 3  . venlafaxine XR (EFFEXOR-XR) 150 MG 24 hr capsule Take 2 capsules (300 mg total) by mouth daily with breakfast. Taper as directed over 7 weeks 60 capsule 3  . venlafaxine XR (EFFEXOR-XR) 75 MG 24 hr capsule Take 1 capsule (75 mg total) by mouth daily with breakfast. 30 capsule 3  . zolpidem (AMBIEN) 5 MG tablet Take 1 tablet (5 mg total) by mouth at bedtime. 30 tablet 3   No current facility-administered medications for this visit.    Allergies  Allergen Reactions  . Abilify [Aripiprazole] Other (See Comments)    akathisia  . Gabapentin Rash  . Nifedipine Rash    To cream    Family History  Problem Relation Age of Onset  . Hypertension Mother   . Diabetes Mother   . Mental illness Mother   . Depression Mother   . Cancer Father        lung  . Alcohol abuse Father   . Dementia Maternal Grandmother   .  Cancer Maternal Grandmother        breast  . COPD Maternal Grandmother   . Depression Maternal Grandmother   . Dementia Maternal Grandfather   . Cancer Maternal Grandfather        Lung  . Diabetes Brother   . ADD / ADHD Brother   . Breast cancer Maternal Aunt 53    Social History   Socioeconomic History  . Marital status: Married    Spouse name: Not on file  . Number of children: 1  . Years of education: Not on file  . Highest education level: Some college, no degree  Occupational History  . Not on file  Tobacco Use  . Smoking status: Never Smoker  . Smokeless tobacco: Never Used  Substance and Sexual Activity  . Alcohol use: Yes    Comment: occasional  . Drug use: No  . Sexual activity: Not on file  Other Topics Concern  . Not on file  Social  History Narrative  . Not on file   Social Determinants of Health   Financial Resource Strain:   . Difficulty of Paying Living Expenses: Not on file  Food Insecurity:   . Worried About Charity fundraiser in the Last Year: Not on file  . Ran Out of Food in the Last Year: Not on file  Transportation Needs:   . Lack of Transportation (Medical): Not on file  . Lack of Transportation (Non-Medical): Not on file  Physical Activity:   . Days of Exercise per Week: Not on file  . Minutes of Exercise per Session: Not on file  Stress:   . Feeling of Stress : Not on file  Social Connections:   . Frequency of Communication with Friends and Family: Not on file  . Frequency of Social Gatherings with Friends and Family: Not on file  . Attends Religious Services: Not on file  . Active Member of Clubs or Organizations: Not on file  . Attends Archivist Meetings: Not on file  . Marital Status: Not on file  Intimate Partner Violence:   . Fear of Current or Ex-Partner: Not on file  . Emotionally Abused: Not on file  . Physically Abused: Not on file  . Sexually Abused: Not on file     Constitutional: Pt reports weight gain, headache. Denies fever, malaise, fatigue.  HEENT: Denies eye pain, eye redness, ear pain, ringing in the ears, wax buildup, runny nose, nasal congestion, bloody nose, or sore throat. Respiratory: Denies difficulty breathing, shortness of breath, cough or sputum production.   Cardiovascular: Denies chest pain, chest tightness, palpitations or swelling in the hands or feet.  Gastrointestinal: Denies abdominal pain, bloating, constipation, diarrhea or blood in the stool.  Skin: Pt reports lymph node swelling. Denies redness, rashes, lesions or ulcercations.   No other specific complaints in a complete review of systems (except as listed in HPI above).     Objective:   Physical Exam BP 120/76   Pulse (!) 102   Temp 98.2 F (36.8 C) (Temporal)   Wt 177 lb (80.3 kg)    SpO2 98%   BMI 32.37 kg/m   Wt Readings from Last 3 Encounters:  11/25/18 177 lb (80.3 kg)  03/29/18 138 lb (62.6 kg)  07/14/17 145 lb (65.8 kg)    General: Appears her stated age, obese, in NAD. Skin: Warm, dry and intact. No rashes, lesions or ulcerations noted. HEENT: Head: normal shape and size; Eyes: sclera white, no icterus, conjunctiva pink, PERRLA  and EOMs intact; Throat/Mouth: Teeth present, mucosa pink and moist, no exudate, lesions or ulcerations noted.  Neck:  Bilateral anterior cervical adenopathy noted. No thyroid mass noted or thyromegaly. Neurological: Alert and oriented.  Coordination normal.   BMET    Component Value Date/Time   NA 136 03/29/2018 1722   NA 140 12/11/2016 1516   K 3.5 03/29/2018 1722   CL 108 03/29/2018 1722   CO2 21 (L) 03/29/2018 1722   GLUCOSE 121 (H) 03/29/2018 1722   BUN 13 03/29/2018 1722   BUN 13 12/11/2016 1516   CREATININE 0.71 03/29/2018 1722   CALCIUM 8.7 (L) 03/29/2018 1722   GFRNONAA >60 03/29/2018 1722   GFRAA >60 03/29/2018 1722    Lipid Panel     Component Value Date/Time   CHOL 157 06/27/2017 0659   TRIG 242 (H) 06/27/2017 0659   HDL 55 06/27/2017 0659   CHOLHDL 2.9 06/27/2017 0659   VLDL 48 (H) 06/27/2017 0659   LDLCALC 54 06/27/2017 0659    CBC    Component Value Date/Time   WBC 14.9 (H) 03/29/2018 1722   RBC 4.13 03/29/2018 1722   HGB 13.1 03/29/2018 1722   HGB 12.3 12/11/2016 1516   HCT 39.2 03/29/2018 1722   HCT 37.7 12/11/2016 1516   PLT 391 03/29/2018 1722   PLT 309 12/11/2016 1516   MCV 94.9 03/29/2018 1722   MCV 96 12/11/2016 1516   MCV 98 06/04/2011 1151   MCH 31.7 03/29/2018 1722   MCHC 33.4 03/29/2018 1722   RDW 12.6 03/29/2018 1722   RDW 13.1 12/11/2016 1516   RDW 12.5 06/04/2011 1151   LYMPHSABS 1.9 12/11/2016 1516   EOSABS 0.1 12/11/2016 1516   BASOSABS 0.0 12/11/2016 1516    Hgb A1C Lab Results  Component Value Date   HGBA1C 4.6 (L) 06/27/2017           Assessment &  Plan:   Acute Headache, Not Intractable:  Toradol 30 mg IM today Continue Florinal with Codeine as needed  Cervical Adenopathy:  Likely reactive Will check CBC today Consider ultrasound if lymph nodes remain enlarged  Abnormal Weight Gain:  A1C today Consider Saxenda if A1C > 5.8%  Will follow up after labs are back, return precautions discussed  Webb Silversmith, NP This visit occurred during the SARS-CoV-2 public health emergency.  Safety protocols were in place, including screening questions prior to the visit, additional usage of staff PPE, and extensive cleaning of exam room while observing appropriate contact time as indicated for disinfecting solutions.

## 2019-01-21 NOTE — Addendum Note (Signed)
Addended by: Ellamae Sia on: 01/21/2019 03:42 PM   Modules accepted: Orders

## 2019-01-22 LAB — CBC
HCT: 38.6 % (ref 35.0–45.0)
Hemoglobin: 13.2 g/dL (ref 11.7–15.5)
MCH: 32 pg (ref 27.0–33.0)
MCHC: 34.2 g/dL (ref 32.0–36.0)
MCV: 93.7 fL (ref 80.0–100.0)
MPV: 10.5 fL (ref 7.5–12.5)
Platelets: 417 10*3/uL — ABNORMAL HIGH (ref 140–400)
RBC: 4.12 10*6/uL (ref 3.80–5.10)
RDW: 12.8 % (ref 11.0–15.0)
WBC: 11.4 10*3/uL — ABNORMAL HIGH (ref 3.8–10.8)

## 2019-01-22 LAB — HEMOGLOBIN A1C
Hgb A1c MFr Bld: 5 % of total Hgb (ref <5.7)
Mean Plasma Glucose: 97 (calc)
eAG (mmol/L): 5.4 (calc)

## 2019-01-22 LAB — TSH: TSH: 12.54 mIU/L — ABNORMAL HIGH

## 2019-01-22 MED ORDER — LEVOTHYROXINE SODIUM 50 MCG PO TABS: 100.0000 ug | ORAL_TABLET | Freq: Every day | ORAL | 0 refills | Status: DC

## 2019-01-22 NOTE — Addendum Note (Signed)
Addended by: Jearld Fenton on: 01/22/2019 08:49 PM   Modules accepted: Orders

## 2019-01-24 ENCOUNTER — Other Ambulatory Visit: Payer: Self-pay

## 2019-01-24 ENCOUNTER — Ambulatory Visit: Payer: Medicare HMO | Admitting: Pharmacist

## 2019-01-24 ENCOUNTER — Encounter: Payer: Self-pay | Admitting: Internal Medicine

## 2019-01-24 DIAGNOSIS — Z79899 Other long term (current) drug therapy: Secondary | ICD-10-CM

## 2019-01-24 MED ORDER — LEVOTHYROXINE SODIUM 50 MCG PO TABS: 100.0000 ug | ORAL_TABLET | Freq: Every day | ORAL | 0 refills | Status: DC

## 2019-01-24 NOTE — Progress Notes (Signed)
  Medication Management Clinic Visit Note  Patient: Heather King MRN: [id number] Date of Birth: [birth year] PCP: Jearld Fenton, NP   Heather King 38 y.o. female presents for a MTM visit today.  There were no vitals taken for this visit.  Patient Information   Past Medical History:  Diagnosis Date  . ADD (attention deficit disorder)   . Anxiety   . Bipolar disorder ([place])   . Cancer (HCC)    Cervical Cancer  . Depression   . GERD (gastroesophageal reflux disease)   . Huntington's disease ([place])   . Insomnia due to medical condition       Past Surgical History:  Procedure Laterality Date  . ABDOMINAL HYSTERECTOMY  2013  . CHOLECYSTECTOMY  2012  . LEEP  2007     Family History  Problem Relation Age of Onset  . Hypertension Mother   . Diabetes Mother   . Mental illness Mother   . Depression Mother   . Cancer Father        lung  . Alcohol abuse Father   . Dementia Maternal Grandmother   . Cancer Maternal Grandmother        breast  . COPD Maternal Grandmother   . Depression Maternal Grandmother   . Dementia Maternal Grandfather   . Cancer Maternal Grandfather        Lung  . Diabetes Brother   . ADD / ADHD Brother   . Breast cancer Maternal Aunt 63     Social History   Substance and Sexual Activity  Alcohol Use Yes   Comment: occasional      Social History   Tobacco Use  Smoking Status Never Smoker  Smokeless Tobacco Never Used      Health Maintenance  Topic Date Due  . TETANUS/TDAP  02/16/1999  . INFLUENZA VACCINE  09/11/2018  . HIV Screening  Completed     Assessment and Plan:  Renal function:  Baseline serum creatinine appears to be 0.6-0.7   Hyperthyroid disease:  100 mcg daily, which was recently increased from 50 mcg daily.  Recent TSH 12.54.  She returns for follow labs in 1 month.   ADHD:  Adderall   Anxiety:  Clonazepam     Bipolar Depression:  Venlafaxine.  She has taken this for around 15 years.     Chronic  migraines:  Fiorinal.  Experiences migraines 3-4 times/week with light sensitivity.  She also reports throwing up.  She has tried sumatriptan, topamax, beta blockers.  However no relief with these agents.   GERD:  Pantoprazole.  She denies hoarseness, but coughs frequently.  We discussed avoiding meals ~4 hours from bedtime, as well as inclining pillows in the bed to achieve more relief.   Huntington's Disease:  She follows up with neurology, and will see her neurologist again in February.  She reports issues with short-term memory.  Weakness in hands/legs, and twitching.     Insomnia:  Quetiapine, trazodone.  She reports relief with these medicines.     Follow up: 1 year  Gerald Dexter, PharmD Pharmacy Resident  01/24/2019 3:56 PM

## 2019-01-27 ENCOUNTER — Telehealth: Payer: Self-pay | Admitting: Pharmacy Technician

## 2019-01-27 NOTE — Telephone Encounter (Signed)
Patient has prescription drug coverage with [facility].  No longer meets [facility]'s eligibility criteria.  Patient notified.  [facility]

## 2019-02-01 ENCOUNTER — Encounter: Payer: Self-pay | Admitting: Internal Medicine

## 2019-02-01 MED ORDER — LEVOTHYROXINE SODIUM 50 MCG PO TABS: 100.0000 ug | ORAL_TABLET | Freq: Every day | ORAL | 0 refills | Status: DC

## 2019-02-02 ENCOUNTER — Telehealth: Payer: Self-pay

## 2019-02-02 ENCOUNTER — Other Ambulatory Visit: Payer: Self-pay | Admitting: Psychiatry

## 2019-02-02 NOTE — Telephone Encounter (Signed)
Medication refill - Patient left a message she is in need of a new Fiorinal/Codeine #3 order for her headaches.  Requests a new order be sent into her [facility] on [address].

## 2019-02-03 ENCOUNTER — Other Ambulatory Visit: Payer: Self-pay | Admitting: Psychiatry

## 2019-02-03 MED ORDER — BUTALBITAL-ASA-CAFF-CODEINE 50-325-40-30 MG PO CAPS: 1.0000 | ORAL_CAPSULE | ORAL | 3 refills | Status: DC | PRN

## 2019-02-08 ENCOUNTER — Encounter: Payer: Self-pay | Admitting: Internal Medicine

## 2019-02-08 ENCOUNTER — Telehealth: Payer: Self-pay

## 2019-02-08 NOTE — Telephone Encounter (Signed)
received a fax from [place] that a temporay supply of the ascomp/cod cap 30mg  was issues to patient but that next month this medication will not be on the patient drug list of covered medication.

## 2019-02-15 ENCOUNTER — Ambulatory Visit: Payer: Medicare HMO | Admitting: Psychiatry

## 2019-02-15 ENCOUNTER — Other Ambulatory Visit: Payer: Self-pay

## 2019-03-01 ENCOUNTER — Other Ambulatory Visit: Payer: Self-pay

## 2019-03-01 ENCOUNTER — Ambulatory Visit (INDEPENDENT_AMBULATORY_CARE_PROVIDER_SITE_OTHER): Payer: Medicare HMO | Admitting: Psychiatry

## 2019-03-01 ENCOUNTER — Encounter: Payer: Self-pay | Admitting: Psychiatry

## 2019-03-01 DIAGNOSIS — G10 Huntington's disease: Secondary | ICD-10-CM | POA: Diagnosis not present

## 2019-03-01 MED ORDER — VENLAFAXINE HCL ER 150 MG PO CP24: 300.0000 mg | ORAL_CAPSULE | Freq: Every day | ORAL | 3 refills | Status: DC

## 2019-03-01 MED ORDER — AMPHETAMINE-DEXTROAMPHETAMINE 20 MG PO TABS: 20.0000 mg | ORAL_TABLET | Freq: Two times a day (BID) | ORAL | 0 refills | Status: DC

## 2019-03-01 MED ORDER — VENLAFAXINE HCL ER 75 MG PO CP24: 75.0000 mg | ORAL_CAPSULE | Freq: Every day | ORAL | 3 refills | Status: DC

## 2019-03-01 MED ORDER — BUTALBITAL-ASA-CAFF-CODEINE 50-325-40-30 MG PO CAPS: 1.0000 | ORAL_CAPSULE | ORAL | 3 refills | Status: DC | PRN

## 2019-03-01 MED ORDER — QUETIAPINE FUMARATE 200 MG PO TABS: 400.0000 mg | ORAL_TABLET | Freq: Every day | ORAL | 3 refills | Status: DC

## 2019-03-01 MED ORDER — CLONAZEPAM 0.5 MG PO TABS: 0.5000 mg | ORAL_TABLET | Freq: Four times a day (QID) | ORAL | 2 refills | Status: DC

## 2019-03-01 NOTE — Progress Notes (Signed)
Follow-up for this 39 year old woman with Huntington's disease.  Reach patient by telephone.  She was appropriate alert oriented and cooperative with the interview.  Patient states that in the last few months she has begun to notice more frequent spells of depression.  These episodes will come on very suddenly and seemed to have no precipitant.  They can last anywhere from a couple hours to 3 days at a time.  During these she will feel very sad, have crying spells and occasionally have suicidal thoughts.  She has not actually done anything to hurt herself and does not feel that she is out of control as far as suicidality.  She is not experiencing any return of anger problems.  Sleep continues to not be excellent.  She occasionally has been taking increased Seroquel but not consistently.  Patient was alert and oriented.  Affect appropriate.  Thoughts lucid.  Good insight and judgment.  Appeared to be cognitively intact.  Did not have any current thoughts or intent of hurting herself.  Showed a good ability to maintain a positive outlook to oppose any suicidal thoughts.  We discussed how Huntington's disease is a very complicated and unpredictable condition.  We reviewed her medicine.  She is already on the maximum dose of Effexor.  I propose that we increase the dose of Seroquel to a standing dose of 400 mg at night on the thought that this is a bipolar-like manifestation of mood.  Patient understands side effects and is agreeable.  [facility] she now has her insurance and affect.  Follow-up 3 months.

## 2019-03-07 DIAGNOSIS — Z20822 Contact with and (suspected) exposure to covid-19: Secondary | ICD-10-CM | POA: Diagnosis not present

## 2019-03-07 DIAGNOSIS — Z20828 Contact with and (suspected) exposure to other viral communicable diseases: Secondary | ICD-10-CM | POA: Diagnosis not present

## 2019-03-08 ENCOUNTER — Encounter: Payer: Self-pay | Admitting: Internal Medicine

## 2019-03-09 ENCOUNTER — Encounter: Payer: Self-pay | Admitting: Internal Medicine

## 2019-03-09 ENCOUNTER — Other Ambulatory Visit: Payer: Self-pay | Admitting: Psychiatry

## 2019-03-09 ENCOUNTER — Ambulatory Visit (INDEPENDENT_AMBULATORY_CARE_PROVIDER_SITE_OTHER): Payer: Medicare HMO | Admitting: Internal Medicine

## 2019-03-09 DIAGNOSIS — U071 COVID-19: Secondary | ICD-10-CM | POA: Diagnosis not present

## 2019-03-09 MED ORDER — HYDROCODONE-HOMATROPINE 5-1.5 MG/5ML PO SYRP: 5.0000 mL | ORAL_SOLUTION | Freq: Three times a day (TID) | ORAL | 0 refills | Status: DC | PRN

## 2019-03-09 MED ORDER — PREDNISONE 10 MG PO TABS: ORAL_TABLET | ORAL | 0 refills | Status: DC

## 2019-03-09 MED ORDER — ALBUTEROL SULFATE HFA 108 (90 BASE) MCG/ACT IN AERS: 2.0000 | INHALATION_SPRAY | Freq: Four times a day (QID) | RESPIRATORY_TRACT | 0 refills | Status: DC | PRN

## 2019-03-09 NOTE — Patient Instructions (Signed)
COVID-19 COVID-19 is a respiratory infection that is caused by a virus called severe acute respiratory syndrome coronavirus 2 (SARS-CoV-2). The disease is also known as coronavirus disease or novel coronavirus. In some people, the virus may not cause any symptoms. In others, it may cause a serious infection. The infection can get worse quickly and can lead to complications, such as:  Pneumonia, or infection of the lungs.  Acute respiratory distress syndrome or ARDS. This is a condition in which fluid build-up in the lungs prevents the lungs from filling with air and passing oxygen into the blood.  Acute respiratory failure. This is a condition in which there is not enough oxygen passing from the lungs to the body or when carbon dioxide is not passing from the lungs out of the body.  Sepsis or septic shock. This is a serious bodily reaction to an infection.  Blood clotting problems.  Secondary infections due to bacteria or fungus.  Organ failure. This is when your body's organs stop working. The virus that causes COVID-19 is contagious. This means that it can spread from person to person through droplets from coughs and sneezes (respiratory secretions). What are the causes? This illness is caused by a virus. You may catch the virus by:  Breathing in droplets from an infected person. Droplets can be spread by a person breathing, speaking, singing, coughing, or sneezing.  Touching something, like a table or a doorknob, that was exposed to the virus (contaminated) and then touching your mouth, nose, or eyes. What increases the risk? Risk for infection You are more likely to be infected with this virus if you:  Are within 6 feet (2 meters) of a person with COVID-19.  Provide care for or live with a person who is infected with COVID-19.  Spend time in crowded indoor spaces or live in shared housing. Risk for serious illness You are more likely to become seriously ill from the virus if you:   Are 50 years of age or older. The higher your age, the more you are at risk for serious illness.  Live in a nursing home or long-term care facility.  Have cancer.  Have a long-term (chronic) disease such as: ? Chronic lung disease, including chronic obstructive pulmonary disease or asthma. ? A long-term disease that lowers your body's ability to fight infection (immunocompromised). ? Heart disease, including heart failure, a condition in which the arteries that lead to the heart become narrow or blocked (coronary artery disease), a disease which makes the heart muscle thick, weak, or stiff (cardiomyopathy). ? Diabetes. ? Chronic kidney disease. ? Sickle cell disease, a condition in which red blood cells have an abnormal "sickle" shape. ? Liver disease.  Are obese. What are the signs or symptoms? Symptoms of this condition can range from mild to severe. Symptoms may appear any time from 2 to 14 days after being exposed to the virus. They include:  A fever or chills.  A cough.  Difficulty breathing.  Headaches, body aches, or muscle aches.  Runny or stuffy (congested) nose.  A sore throat.  New loss of taste or smell. Some people may also have stomach problems, such as nausea, vomiting, or diarrhea. Other people may not have any symptoms of COVID-19. How is this diagnosed? This condition may be diagnosed based on:  Your signs and symptoms, especially if: ? You live in an area with a COVID-19 outbreak. ? You recently traveled to or from an area where the virus is common. ? You   provide care for or live with a person who was diagnosed with COVID-19. ? You were exposed to a person who was diagnosed with COVID-19.  A physical exam.  Lab tests, which may include: ? Taking a sample of fluid from the back of your nose and throat (nasopharyngeal fluid), your nose, or your throat using a swab. ? A sample of mucus from your lungs (sputum). ? Blood tests.  Imaging tests, which  may include, X-rays, CT scan, or ultrasound. How is this treated? At present, there is no medicine to treat COVID-19. Medicines that treat other diseases are being used on a trial basis to see if they are effective against COVID-19. Your health care provider will talk with you about ways to treat your symptoms. For most people, the infection is mild and can be managed at home with rest, fluids, and over-the-counter medicines. Treatment for a serious infection usually takes places in a hospital intensive care unit (ICU). It may include one or more of the following treatments. These treatments are given until your symptoms improve.  Receiving fluids and medicines through an IV.  Supplemental oxygen. Extra oxygen is given through a tube in the nose, a face mask, or a hood.  Positioning you to lie on your stomach (prone position). This makes it easier for oxygen to get into the lungs.  Continuous positive airway pressure (CPAP) or bi-level positive airway pressure (BPAP) machine. This treatment uses mild air pressure to keep the airways open. A tube that is connected to a motor delivers oxygen to the body.  Ventilator. This treatment moves air into and out of the lungs by using a tube that is placed in your windpipe.  Tracheostomy. This is a procedure to create a hole in the neck so that a breathing tube can be inserted.  Extracorporeal membrane oxygenation (ECMO). This procedure gives the lungs a chance to recover by taking over the functions of the heart and lungs. It supplies oxygen to the body and removes carbon dioxide. Follow these instructions at home: Lifestyle  If you are sick, stay home except to get medical care. Your health care provider will tell you how long to stay home. Call your health care provider before you go for medical care.  Rest at home as told by your health care provider.  Do not use any products that contain nicotine or tobacco, such as cigarettes, e-cigarettes, and  chewing tobacco. If you need help quitting, ask your health care provider.  Return to your normal activities as told by your health care provider. Ask your health care provider what activities are safe for you. General instructions  Take over-the-counter and prescription medicines only as told by your health care provider.  Drink enough fluid to keep your urine pale yellow.  Keep all follow-up visits as told by your health care provider. This is important. How is this prevented?  There is no vaccine to help prevent COVID-19 infection. However, there are steps you can take to protect yourself and others from this virus. To protect yourself:   Do not travel to areas where COVID-19 is a risk. The areas where COVID-19 is reported change often. To identify high-risk areas and travel restrictions, check the CDC travel website: wwwnc.cdc.gov/travel/notices  If you live in, or must travel to, an area where COVID-19 is a risk, take precautions to avoid infection. ? Stay away from people who are sick. ? Wash your hands often with soap and water for 20 seconds. If soap and water   are not available, use an alcohol-based hand sanitizer. ? Avoid touching your mouth, face, eyes, or nose. ? Avoid going out in public, follow guidance from your state and local health authorities. ? If you must go out in public, wear a cloth face covering or face mask. Make sure your mask covers your nose and mouth. ? Avoid crowded indoor spaces. Stay at least 6 feet (2 meters) away from others. ? Disinfect objects and surfaces that are frequently touched every day. This may include:  Counters and tables.  Doorknobs and light switches.  Sinks and faucets.  Electronics, such as phones, remote controls, keyboards, computers, and tablets. To protect others: If you have symptoms of COVID-19, take steps to prevent the virus from spreading to others.  If you think you have a COVID-19 infection, contact your health care  provider right away. Tell your health care team that you think you may have a COVID-19 infection.  Stay home. Leave your house only to seek medical care. Do not use public transport.  Do not travel while you are sick.  Wash your hands often with soap and water for 20 seconds. If soap and water are not available, use alcohol-based hand sanitizer.  Stay away from other members of your household. Let healthy household members care for children and pets, if possible. If you have to care for children or pets, wash your hands often and wear a mask. If possible, stay in your own room, separate from others. Use a different bathroom.  Make sure that all people in your household wash their hands well and often.  Cough or sneeze into a tissue or your sleeve or elbow. Do not cough or sneeze into your hand or into the air.  Wear a cloth face covering or face mask. Make sure your mask covers your nose and mouth. Where to find more information  Centers for Disease Control and Prevention: www.cdc.gov/coronavirus/2019-ncov/index.html  World Health Organization: www.who.int/health-topics/coronavirus Contact a health care provider if:  You live in or have traveled to an area where COVID-19 is a risk and you have symptoms of the infection.  You have had contact with someone who has COVID-19 and you have symptoms of the infection. Get help right away if:  You have trouble breathing.  You have pain or pressure in your chest.  You have confusion.  You have bluish lips and fingernails.  You have difficulty waking from sleep.  You have symptoms that get worse. These symptoms may represent a serious problem that is an emergency. Do not wait to see if the symptoms will go away. Get medical help right away. Call your local emergency services (911 in the [place] Do not drive yourself to the hospital. Let the emergency medical personnel know if you think you have COVID-19. Summary  COVID-19 is a  respiratory infection that is caused by a virus. It is also known as coronavirus disease or novel coronavirus. It can cause serious infections, such as pneumonia, acute respiratory distress syndrome, acute respiratory failure, or sepsis.  The virus that causes COVID-19 is contagious. This means that it can spread from person to person through droplets from breathing, speaking, singing, coughing, or sneezing.  You are more likely to develop a serious illness if you are 50 years of age or older, have a weak immune system, live in a nursing home, or have chronic disease.  There is no medicine to treat COVID-19. Your health care provider will talk with you about ways to treat your symptoms.    Take steps to protect yourself and others from infection. Wash your hands often and disinfect objects and surfaces that are frequently touched every day. Stay away from people who are sick and wear a mask if you are sick. This information is not intended to replace advice given to you by your health care provider. Make sure you discuss any questions you have with your health care provider. Document Revised: 11/26/2018 Document Reviewed: 03/04/2018 Elsevier Patient Education  2020 Elsevier Inc.  

## 2019-03-09 NOTE — Progress Notes (Signed)
Virtual Visit via Video Note  I connected with Heather King on 03/09/19 at  4:15 PM EST by a video enabled telemedicine application and verified that I am speaking with the correct person using two identifiers.  Location: Patient: Home Provider: Office   I discussed the limitations of evaluation and management by telemedicine and the availability of in person appointments. The patient expressed understanding and agreed to proceed.  History of Present Illness:  Pt reports headache, hoarseness, ear pain, sore throat and cough. This started 1 week ago. The headache is located on the top of her pain. She describes the pain as constant throbbing. She has some lightheadedness, sensitivity to light and sound. She is having pain with swallowing. The cough is dry and nonproductive. She does have some SOB. She denies runny nose, nasal congestion, loss of taste or smell. Her temp has been as high as 104, and having body aches and chills. She tested positive for COVID on 03/07/19. She has been taking Ibuprofen, Tylenol, Mucinex, Delsym with minimal relief.   Past Medical History:  Diagnosis Date  . ADD (attention deficit disorder)   . Anxiety   . Bipolar disorder ([name])   . Cancer (HCC)    Cervical Cancer  . Depression   . GERD (gastroesophageal reflux disease)   . Huntington's disease ([name])   . Insomnia due to medical condition     Current Outpatient Medications  Medication Sig Dispense Refill  . albuterol (VENTOLIN HFA) 108 (90 Base) MCG/ACT inhaler Inhale 2 puffs into the lungs every 6 (six) hours as needed for wheezing or shortness of breath. 8 g 0  . amphetamine-dextroamphetamine (ADDERALL) 20 MG tablet Take 1 tablet (20 mg total) by mouth 2 (two) times daily with a meal. 60 tablet 0  . amphetamine-dextroamphetamine (ADDERALL) 20 MG tablet Take 1 tablet (20 mg total) by mouth 2 (two) times daily. 60 tablet 0  . amphetamine-dextroamphetamine (ADDERALL) 20 MG tablet Take 1 tablet (20 mg  total) by mouth 2 (two) times daily. 60 tablet 0  . butalbital-aspirin-caffeine-codeine (FIORINAL/CODEINE #3) 50-325-40-30 MG capsule Take 1 capsule by mouth every 4 (four) hours as needed for pain or headache. 120 capsule 3  . cetirizine (ZYRTEC) 10 MG tablet Take 1 tablet (10 mg total) by mouth daily. (Patient taking differently: Take 10 mg by mouth daily. Patient takes PRN) 30 tablet 11  . clonazePAM (KLONOPIN) 0.5 MG tablet Take 1 tablet (0.5 mg total) by mouth 4 (four) times daily. 120 tablet 2  . HYDROcodone-homatropine (HYCODAN) 5-1.5 MG/5ML syrup Take 5 mLs by mouth every 8 (eight) hours as needed for cough. 120 mL 0  . levothyroxine (SYNTHROID) 50 MCG tablet Take 2 tablets (100 mcg total) by mouth daily. 60 tablet 0  . pantoprazole (PROTONIX) 40 MG tablet Take 1 tablet (40 mg total) by mouth 2 (two) times daily. (Patient taking differently: Take 40 mg by mouth daily. ) 60 tablet 3  . predniSONE (DELTASONE) 10 MG tablet Take 6 tabs day 1, 5 tabs day 2, 4 tabs day 3, 3 tabs day 4, 2 tabs day 5, 1 tab day 6 21 tablet 0  . QUEtiapine (SEROQUEL) 200 MG tablet Take 2 tablets (400 mg total) by mouth at bedtime. 60 tablet 3  . venlafaxine XR (EFFEXOR-XR) 150 MG 24 hr capsule Take 2 capsules (300 mg total) by mouth daily with breakfast. Taper as directed over 7 weeks 60 capsule 3  . venlafaxine XR (EFFEXOR-XR) 75 MG 24 hr capsule Take 1 capsule (  75 mg total) by mouth daily with breakfast. 30 capsule 3   No current facility-administered medications for this visit.    Allergies  Allergen Reactions  . Abilify [Aripiprazole] Other (See Comments)    akathisia  . Gabapentin Rash  . Nifedipine Rash    To cream    Family History  Problem Relation Age of Onset  . Hypertension Mother   . Diabetes Mother   . Mental illness Mother   . Depression Mother   . Cancer Father        lung  . Alcohol abuse Father   . Dementia Maternal Grandmother   . Cancer Maternal Grandmother        breast  . COPD  Maternal Grandmother   . Depression Maternal Grandmother   . Dementia Maternal Grandfather   . Cancer Maternal Grandfather        Lung  . Diabetes Brother   . ADD / ADHD Brother   . Breast cancer Maternal Aunt 62    Social History   Socioeconomic History  . Marital status: Married    Spouse name: Not on file  . Number of children: 1  . Years of education: Not on file  . Highest education level: Some college, no degree  Occupational History  . Not on file  Tobacco Use  . Smoking status: Never Smoker  . Smokeless tobacco: Never Used  Substance and Sexual Activity  . Alcohol use: Yes    Comment: occasional  . Drug use: No  . Sexual activity: Not on file  Other Topics Concern  . Not on file  Social History Narrative  . Not on file   Social Determinants of Health   Financial Resource Strain:   . Difficulty of Paying Living Expenses: Not on file  Food Insecurity:   . Worried About Charity fundraiser in the Last Year: Not on file  . Ran Out of Food in the Last Year: Not on file  Transportation Needs:   . Lack of Transportation (Medical): Not on file  . Lack of Transportation (Non-Medical): Not on file  Physical Activity:   . Days of Exercise per Week: Not on file  . Minutes of Exercise per Session: Not on file  Stress:   . Feeling of Stress : Not on file  Social Connections:   . Frequency of Communication with Friends and Family: Not on file  . Frequency of Social Gatherings with Friends and Family: Not on file  . Attends Religious Services: Not on file  . Active Member of Clubs or Organizations: Not on file  . Attends Archivist Meetings: Not on file  . Marital Status: Not on file  Intimate Partner Violence:   . Fear of Current or Ex-Partner: Not on file  . Emotionally Abused: Not on file  . Physically Abused: Not on file  . Sexually Abused: Not on file     Constitutional: Pt reports fever, headache. Denies malaise, fatigue, or abrupt weight  changes.  HEENT: Pt reports ear pain, sore throat. Denies eye pain, eye redness, ringing in the ears, wax buildup, runny nose, nasal congestion, bloody nose. Respiratory: Pt reports cough and SOB. Denies difficulty breathing, shortness of breath, or sputum production.   Cardiovascular: Denies chest pain, chest tightness, palpitations or swelling in the hands or feet.  Gastrointestinal: Denies abdominal pain, bloating, constipation, diarrhea or blood in the stool.  Musculoskeletal: Pt reports generalized body aches. Denies decrease in range of motion, difficulty with gait, or  joint pain and swelling.  Skin: Denies redness, rashes, lesions or ulcercations.  Neurological: Pt reports lightheadedness, sensitivity to light and sound. Denies dizziness, difficulty with memory, difficulty with speech or problems with balance and coordination.    No other specific complaints in a complete review of systems (except as listed in HPI above).   Observations/Objective:  There were no vitals taken for this visit. Wt Readings from Last 3 Encounters:  01/21/19 177 lb (80.3 kg)  11/25/18 177 lb (80.3 kg)  03/29/18 138 lb (62.6 kg)    General: Appears her stated age, ill appearing but in NAD. Skin: Warm, dry and intact. No rashesnoted. HEENT: Head: normal shape and size; Nose: no congestion noted; Throat/Mouth: hoarseness noted. Pulmonary/Chest: Normal effort. No respiratory distress.  Neurological: Alert and oriented. Coordination normal.  Ps  BMET    Component Value Date/Time   NA 136 03/29/2018 1722   NA 140 12/11/2016 1516   K 3.5 03/29/2018 1722   CL 108 03/29/2018 1722   CO2 21 (L) 03/29/2018 1722   GLUCOSE 121 (H) 03/29/2018 1722   BUN 13 03/29/2018 1722   BUN 13 12/11/2016 1516   CREATININE 0.71 03/29/2018 1722   CALCIUM 8.7 (L) 03/29/2018 1722   GFRNONAA >60 03/29/2018 1722   GFRAA >60 03/29/2018 1722    Lipid Panel     Component Value Date/Time   CHOL 157 06/27/2017 0659    TRIG 242 (H) 06/27/2017 0659   HDL 55 06/27/2017 0659   CHOLHDL 2.9 06/27/2017 0659   VLDL 48 (H) 06/27/2017 0659   LDLCALC 54 06/27/2017 0659    CBC    Component Value Date/Time   WBC 11.4 (H) 01/21/2019 1546   RBC 4.12 01/21/2019 1546   HGB 13.2 01/21/2019 1546   HGB 12.3 12/11/2016 1516   HCT 38.6 01/21/2019 1546   HCT 37.7 12/11/2016 1516   PLT 417 (H) 01/21/2019 1546   PLT 309 12/11/2016 1516   MCV 93.7 01/21/2019 1546   MCV 96 12/11/2016 1516   MCV 98 06/04/2011 1151   MCH 32.0 01/21/2019 1546   MCHC 34.2 01/21/2019 1546   RDW 12.8 01/21/2019 1546   RDW 13.1 12/11/2016 1516   RDW 12.5 06/04/2011 1151   LYMPHSABS 1.9 12/11/2016 1516   EOSABS 0.1 12/11/2016 1516   BASOSABS 0.0 12/11/2016 1516    Hgb A1C Lab Results  Component Value Date   HGBA1C 5.0 01/21/2019       Assessment and Plan:  COVID 19:  Encouraged symptomatic care: rest and fluids Continue Tylenol as needed for fever and body aches RX for Pred Taper x 6 days RX for Albuterol inhaler 1-2 puffs Q4-6H prn RX for Hycodan for cough She will self quarantine for a minimum of 10 days Encouraged frequent handwashing, masking and social distancing, even in the home  ER precautions discussed   Follow Up Instructions:    I discussed the assessment and treatment plan with the patient. The patient was provided an opportunity to ask questions and all were answered. The patient agreed with the plan and demonstrated an understanding of the instructions.   The patient was advised to call back or seek an in-person evaluation if the symptoms worsen or if the condition fails to improve as anticipated.     Webb Silversmith, NP

## 2019-03-10 ENCOUNTER — Other Ambulatory Visit: Payer: Self-pay | Admitting: Internal Medicine

## 2019-03-17 DIAGNOSIS — E063 Autoimmune thyroiditis: Secondary | ICD-10-CM | POA: Diagnosis not present

## 2019-03-17 DIAGNOSIS — E039 Hypothyroidism, unspecified: Secondary | ICD-10-CM | POA: Diagnosis not present

## 2019-03-17 DIAGNOSIS — F341 Dysthymic disorder: Secondary | ICD-10-CM | POA: Diagnosis not present

## 2019-03-17 DIAGNOSIS — Z6831 Body mass index (BMI) 31.0-31.9, adult: Secondary | ICD-10-CM | POA: Diagnosis not present

## 2019-03-17 DIAGNOSIS — G10 Huntington's disease: Secondary | ICD-10-CM | POA: Diagnosis not present

## 2019-03-17 DIAGNOSIS — F4322 Adjustment disorder with anxiety: Secondary | ICD-10-CM | POA: Diagnosis not present

## 2019-03-17 DIAGNOSIS — E6609 Other obesity due to excess calories: Secondary | ICD-10-CM | POA: Diagnosis not present

## 2019-03-17 DIAGNOSIS — E781 Pure hyperglyceridemia: Secondary | ICD-10-CM | POA: Diagnosis not present

## 2019-03-17 DIAGNOSIS — F909 Attention-deficit hyperactivity disorder, unspecified type: Secondary | ICD-10-CM | POA: Diagnosis not present

## 2019-03-17 DIAGNOSIS — E049 Nontoxic goiter, unspecified: Secondary | ICD-10-CM | POA: Diagnosis not present

## 2019-03-17 DIAGNOSIS — E559 Vitamin D deficiency, unspecified: Secondary | ICD-10-CM | POA: Diagnosis not present

## 2019-03-17 DIAGNOSIS — Z1331 Encounter for screening for depression: Secondary | ICD-10-CM | POA: Diagnosis not present

## 2019-03-24 ENCOUNTER — Other Ambulatory Visit: Payer: Self-pay | Admitting: Psychiatry

## 2019-03-31 DIAGNOSIS — E781 Pure hyperglyceridemia: Secondary | ICD-10-CM | POA: Diagnosis not present

## 2019-03-31 DIAGNOSIS — E6609 Other obesity due to excess calories: Secondary | ICD-10-CM | POA: Diagnosis not present

## 2019-03-31 DIAGNOSIS — G10 Huntington's disease: Secondary | ICD-10-CM | POA: Diagnosis not present

## 2019-03-31 DIAGNOSIS — E039 Hypothyroidism, unspecified: Secondary | ICD-10-CM | POA: Diagnosis not present

## 2019-03-31 DIAGNOSIS — E063 Autoimmune thyroiditis: Secondary | ICD-10-CM | POA: Diagnosis not present

## 2019-03-31 DIAGNOSIS — F909 Attention-deficit hyperactivity disorder, unspecified type: Secondary | ICD-10-CM | POA: Diagnosis not present

## 2019-03-31 DIAGNOSIS — E049 Nontoxic goiter, unspecified: Secondary | ICD-10-CM | POA: Diagnosis not present

## 2019-03-31 DIAGNOSIS — F4322 Adjustment disorder with anxiety: Secondary | ICD-10-CM | POA: Diagnosis not present

## 2019-03-31 DIAGNOSIS — F341 Dysthymic disorder: Secondary | ICD-10-CM | POA: Diagnosis not present

## 2019-04-08 ENCOUNTER — Other Ambulatory Visit: Payer: Self-pay | Admitting: Psychiatry

## 2019-04-22 DIAGNOSIS — F4322 Adjustment disorder with anxiety: Secondary | ICD-10-CM | POA: Diagnosis not present

## 2019-04-22 DIAGNOSIS — E049 Nontoxic goiter, unspecified: Secondary | ICD-10-CM | POA: Diagnosis not present

## 2019-04-22 DIAGNOSIS — E781 Pure hyperglyceridemia: Secondary | ICD-10-CM | POA: Diagnosis not present

## 2019-04-22 DIAGNOSIS — E559 Vitamin D deficiency, unspecified: Secondary | ICD-10-CM | POA: Diagnosis not present

## 2019-04-22 DIAGNOSIS — E063 Autoimmune thyroiditis: Secondary | ICD-10-CM | POA: Diagnosis not present

## 2019-04-22 DIAGNOSIS — G10 Huntington's disease: Secondary | ICD-10-CM | POA: Diagnosis not present

## 2019-04-22 DIAGNOSIS — F341 Dysthymic disorder: Secondary | ICD-10-CM | POA: Diagnosis not present

## 2019-04-22 DIAGNOSIS — R002 Palpitations: Secondary | ICD-10-CM | POA: Diagnosis not present

## 2019-04-22 DIAGNOSIS — E039 Hypothyroidism, unspecified: Secondary | ICD-10-CM | POA: Diagnosis not present

## 2019-04-22 DIAGNOSIS — E6609 Other obesity due to excess calories: Secondary | ICD-10-CM | POA: Diagnosis not present

## 2019-04-22 DIAGNOSIS — F909 Attention-deficit hyperactivity disorder, unspecified type: Secondary | ICD-10-CM | POA: Diagnosis not present

## 2019-05-02 DIAGNOSIS — R002 Palpitations: Secondary | ICD-10-CM | POA: Diagnosis not present

## 2019-05-02 DIAGNOSIS — E6609 Other obesity due to excess calories: Secondary | ICD-10-CM | POA: Diagnosis not present

## 2019-05-02 DIAGNOSIS — F4322 Adjustment disorder with anxiety: Secondary | ICD-10-CM | POA: Diagnosis not present

## 2019-05-02 DIAGNOSIS — E559 Vitamin D deficiency, unspecified: Secondary | ICD-10-CM | POA: Diagnosis not present

## 2019-05-02 DIAGNOSIS — F909 Attention-deficit hyperactivity disorder, unspecified type: Secondary | ICD-10-CM | POA: Diagnosis not present

## 2019-05-02 DIAGNOSIS — E063 Autoimmune thyroiditis: Secondary | ICD-10-CM | POA: Diagnosis not present

## 2019-05-02 DIAGNOSIS — G10 Huntington's disease: Secondary | ICD-10-CM | POA: Diagnosis not present

## 2019-05-02 DIAGNOSIS — E781 Pure hyperglyceridemia: Secondary | ICD-10-CM | POA: Diagnosis not present

## 2019-05-02 DIAGNOSIS — E039 Hypothyroidism, unspecified: Secondary | ICD-10-CM | POA: Diagnosis not present

## 2019-05-04 ENCOUNTER — Encounter: Payer: Self-pay | Admitting: Internal Medicine

## 2019-05-07 ENCOUNTER — Other Ambulatory Visit: Payer: Self-pay | Admitting: Psychiatry

## 2019-05-31 ENCOUNTER — Other Ambulatory Visit: Payer: Self-pay | Admitting: Psychiatry

## 2019-05-31 MED ORDER — VENLAFAXINE HCL ER 75 MG PO CP24: 75.0000 mg | ORAL_CAPSULE | Freq: Every day | ORAL | 3 refills | Status: DC

## 2019-05-31 MED ORDER — AMPHETAMINE-DEXTROAMPHETAMINE 20 MG PO TABS: 20.0000 mg | ORAL_TABLET | Freq: Two times a day (BID) | ORAL | 0 refills | Status: DC

## 2019-05-31 MED ORDER — VENLAFAXINE HCL ER 150 MG PO CP24: 300.0000 mg | ORAL_CAPSULE | Freq: Every day | ORAL | 3 refills | Status: DC

## 2019-06-02 ENCOUNTER — Other Ambulatory Visit: Payer: Self-pay

## 2019-06-02 ENCOUNTER — Encounter: Payer: Self-pay | Admitting: Psychiatry

## 2019-06-02 ENCOUNTER — Telehealth (INDEPENDENT_AMBULATORY_CARE_PROVIDER_SITE_OTHER): Payer: Medicare HMO | Admitting: Psychiatry

## 2019-06-02 DIAGNOSIS — G10 Huntington's disease: Secondary | ICD-10-CM

## 2019-06-02 MED ORDER — TRAZODONE HCL 100 MG PO TABS: 100.0000 mg | ORAL_TABLET | Freq: Every day | ORAL | 1 refills | Status: DC

## 2019-06-02 MED ORDER — CLONAZEPAM 0.5 MG PO TABS: 0.5000 mg | ORAL_TABLET | Freq: Four times a day (QID) | ORAL | 1 refills | Status: DC

## 2019-06-02 MED ORDER — QUETIAPINE FUMARATE 200 MG PO TABS: 400.0000 mg | ORAL_TABLET | Freq: Every day | ORAL | 1 refills | Status: DC

## 2019-06-02 MED ORDER — BUTALBITAL-ASA-CAFF-CODEINE 50-325-40-30 MG PO CAPS: 1.0000 | ORAL_CAPSULE | ORAL | 1 refills | Status: DC | PRN

## 2019-06-02 MED ORDER — AMPHETAMINE-DEXTROAMPHETAMINE 20 MG PO TABS: 20.0000 mg | ORAL_TABLET | Freq: Two times a day (BID) | ORAL | 0 refills | Status: DC

## 2019-06-02 MED ORDER — VENLAFAXINE HCL ER 150 MG PO CP24: 300.0000 mg | ORAL_CAPSULE | Freq: Every day | ORAL | 1 refills | Status: DC

## 2019-06-02 MED ORDER — VENLAFAXINE HCL ER 75 MG PO CP24: 75.0000 mg | ORAL_CAPSULE | Freq: Every day | ORAL | 1 refills | Status: DC

## 2019-06-02 NOTE — Progress Notes (Signed)
Follow-up for this 39 year old woman with multiple chronic mood and anxiety symptoms and a history of a diagnosis of Huntington's disease.  Reach patient by telephone.  Established her identity and mine.  Patient reports that she is having a stressful time now because she has had to go to [place] to be with her mother who is sick.  Also having chronic complaints of difficulty with memory and focus.  Patient was alert and oriented with affect appropriate to the situation and reactive.  Thoughts are lucid without any loosening of associations or delusions.  No report of any suicidal or homicidal ideation.  Patient appears to be intact overall and her thinking and comprehension.  Earlier this week a situation arose in which I received a phone call from a pharmacist.  Patient had been requesting that we refill her medications including her Adderall early.  By the time I spoke with the pharmacy to attend to this the patient reportedly had called the pharmacy and misrepresented herself as being Heather King, the nurse in our clinic.  Pharmacist found this alarming.  Reported it to [place] as well.  Because of this kind of situation causing Ms. trust involving dishonesty with the clinic I will no longer be able to see the patient.  I strongly encouraged her to continue follow-up care and encouraged her to consider going to [place] or to [place] [facility] and to notify her primary care doctor.  I have refilled her medications for a total of 2 months because of the difficulty she may have in finding a new provider.  Patient expressed understanding.

## 2019-06-03 ENCOUNTER — Telehealth: Payer: Self-pay

## 2019-06-03 ENCOUNTER — Other Ambulatory Visit: Payer: Self-pay | Admitting: Psychiatry

## 2019-06-03 NOTE — Telephone Encounter (Signed)
Mandy from [facility] called regarding this patient's Adderall 20mg . The script for May states to "Fill after 06/30/2018." The pharmacy was questioning whether it should state 2021 & I confirmed that it should state "Fill after 06/30/2019." Also, they wanted to confirm that you wanted the one for 4/22 filled immediately since this patient has had a history of early refills and calling to state that she's CMA Jess to get them. Please review and advise. Thank you.

## 2019-06-20 ENCOUNTER — Other Ambulatory Visit: Payer: Self-pay | Admitting: Psychiatry

## 2019-07-05 ENCOUNTER — Emergency Department
Admission: EM | Admit: 2019-07-05 | Discharge: 2019-07-06 | Disposition: A | Discharge status: Other Acute Inpatient Hospital | Payer: Medicare HMO | Attending: Emergency Medicine | Admitting: Emergency Medicine

## 2019-07-05 ENCOUNTER — Encounter: Payer: Self-pay | Admitting: Emergency Medicine

## 2019-07-05 DIAGNOSIS — G8929 Other chronic pain: Secondary | ICD-10-CM | POA: Diagnosis present

## 2019-07-05 DIAGNOSIS — F0280 Dementia in other diseases classified elsewhere without behavioral disturbance: Secondary | ICD-10-CM | POA: Insufficient documentation

## 2019-07-05 DIAGNOSIS — R45851 Suicidal ideations: Secondary | ICD-10-CM | POA: Diagnosis not present

## 2019-07-05 DIAGNOSIS — F319 Bipolar disorder, unspecified: Secondary | ICD-10-CM | POA: Insufficient documentation

## 2019-07-05 DIAGNOSIS — F22 Delusional disorders: Secondary | ICD-10-CM | POA: Diagnosis not present

## 2019-07-05 DIAGNOSIS — F329 Major depressive disorder, single episode, unspecified: Secondary | ICD-10-CM | POA: Diagnosis not present

## 2019-07-05 DIAGNOSIS — R4182 Altered mental status, unspecified: Secondary | ICD-10-CM | POA: Diagnosis present

## 2019-07-05 DIAGNOSIS — G4701 Insomnia due to medical condition: Secondary | ICD-10-CM | POA: Diagnosis present

## 2019-07-05 DIAGNOSIS — Z03818 Encounter for observation for suspected exposure to other biological agents ruled out: Secondary | ICD-10-CM | POA: Diagnosis not present

## 2019-07-05 DIAGNOSIS — F909 Attention-deficit hyperactivity disorder, unspecified type: Secondary | ICD-10-CM | POA: Diagnosis not present

## 2019-07-05 DIAGNOSIS — R519 Headache, unspecified: Secondary | ICD-10-CM | POA: Diagnosis present

## 2019-07-05 DIAGNOSIS — F988 Other specified behavioral and emotional disorders with onset usually occurring in childhood and adolescence: Secondary | ICD-10-CM | POA: Diagnosis present

## 2019-07-05 DIAGNOSIS — K219 Gastro-esophageal reflux disease without esophagitis: Secondary | ICD-10-CM | POA: Diagnosis present

## 2019-07-05 DIAGNOSIS — F419 Anxiety disorder, unspecified: Secondary | ICD-10-CM | POA: Diagnosis present

## 2019-07-05 DIAGNOSIS — G10 Huntington's disease: Secondary | ICD-10-CM | POA: Diagnosis not present

## 2019-07-05 DIAGNOSIS — F32A Depression, unspecified: Secondary | ICD-10-CM

## 2019-07-05 DIAGNOSIS — Z20822 Contact with and (suspected) exposure to covid-19: Secondary | ICD-10-CM | POA: Insufficient documentation

## 2019-07-05 DIAGNOSIS — F332 Major depressive disorder, recurrent severe without psychotic features: Secondary | ICD-10-CM | POA: Diagnosis present

## 2019-07-05 LAB — COMPREHENSIVE METABOLIC PANEL
ALT: 18 U/L (ref 0–44)
AST: 18 U/L (ref 15–41)
Albumin: 4.2 g/dL (ref 3.5–5.0)
Alkaline Phosphatase: 101 U/L (ref 38–126)
Anion gap: 11 (ref 5–15)
BUN: 6 mg/dL (ref 6–20)
CO2: 24 mmol/L (ref 22–32)
Calcium: 9 mg/dL (ref 8.9–10.3)
Chloride: 104 mmol/L (ref 98–111)
Creatinine, Ser: 0.46 mg/dL (ref 0.44–1.00)
GFR calc Af Amer: >60 mL/min (ref >60)
GFR calc non Af Amer: >60 mL/min (ref >60)
Glucose, Bld: 109 mg/dL — ABNORMAL HIGH (ref 70–99)
Potassium: 3.5 mmol/L (ref 3.5–5.1)
Sodium: 139 mmol/L (ref 135–145)
Total Bilirubin: 0.5 mg/dL (ref 0.3–1.2)
Total Protein: 7.8 g/dL (ref 6.5–8.1)

## 2019-07-05 LAB — URINE DRUG SCREEN, QUALITATIVE (ARMC ONLY)
Amphetamines, Ur Screen: NOT DETECTED
Barbiturates, Ur Screen: NOT DETECTED
Benzodiazepine, Ur Scrn: NOT DETECTED
Cannabinoid 50 Ng, Ur ~~LOC~~: NOT DETECTED
Cocaine Metabolite,Ur ~~LOC~~: NOT DETECTED
MDMA (Ecstasy)Ur Screen: NOT DETECTED
Methadone Scn, Ur: NOT DETECTED
Opiate, Ur Screen: NOT DETECTED
Phencyclidine (PCP) Ur S: NOT DETECTED
Tricyclic, Ur Screen: NOT DETECTED

## 2019-07-05 LAB — CBC
HCT: 38.2 % (ref 36.0–46.0)
Hemoglobin: 12.9 g/dL (ref 12.0–15.0)
MCH: 31.2 pg (ref 26.0–34.0)
MCHC: 33.8 g/dL (ref 30.0–36.0)
MCV: 92.3 fL (ref 80.0–100.0)
Platelets: 389 10*3/uL (ref 150–400)
RBC: 4.14 MIL/uL (ref 3.87–5.11)
RDW: 13.1 % (ref 11.5–15.5)
WBC: 10.4 10*3/uL (ref 4.0–10.5)
nRBC: 0 % (ref 0.0–0.2)

## 2019-07-05 LAB — ACETAMINOPHEN LEVEL: Acetaminophen (Tylenol), Serum: <10 ug/mL — ABNORMAL LOW (ref 10–30)

## 2019-07-05 LAB — POCT PREGNANCY, URINE: Preg Test, Ur: NEGATIVE

## 2019-07-05 LAB — ETHANOL: Alcohol, Ethyl (B): 89 mg/dL — ABNORMAL HIGH (ref <10)

## 2019-07-05 LAB — SALICYLATE LEVEL: Salicylate Lvl: <7 mg/dL — ABNORMAL LOW (ref 7.0–30.0)
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
## 2019-07-06 ENCOUNTER — Encounter: Payer: Self-pay | Admitting: Psychiatry

## 2019-07-06 ENCOUNTER — Inpatient Hospital Stay
Admit: 2019-07-06 | Discharge: 2019-07-09 | DRG: 885 | Disposition: A | Discharge status: Home / Self Care | Payer: Medicare HMO | Source: Intra-hospital | Attending: Psychiatry | Admitting: Psychiatry

## 2019-07-06 ENCOUNTER — Other Ambulatory Visit: Payer: Self-pay

## 2019-07-06 DIAGNOSIS — F1010 Alcohol abuse, uncomplicated: Secondary | ICD-10-CM

## 2019-07-06 DIAGNOSIS — Z8541 Personal history of malignant neoplasm of cervix uteri: Secondary | ICD-10-CM | POA: Diagnosis not present

## 2019-07-06 DIAGNOSIS — Z79890 Hormone replacement therapy (postmenopausal): Secondary | ICD-10-CM

## 2019-07-06 DIAGNOSIS — F419 Anxiety disorder, unspecified: Secondary | ICD-10-CM | POA: Diagnosis present

## 2019-07-06 DIAGNOSIS — F332 Major depressive disorder, recurrent severe without psychotic features: Principal | ICD-10-CM | POA: Diagnosis present

## 2019-07-06 DIAGNOSIS — Z8249 Family history of ischemic heart disease and other diseases of the circulatory system: Secondary | ICD-10-CM | POA: Diagnosis not present

## 2019-07-06 DIAGNOSIS — K219 Gastro-esophageal reflux disease without esophagitis: Secondary | ICD-10-CM | POA: Diagnosis present

## 2019-07-06 DIAGNOSIS — Z833 Family history of diabetes mellitus: Secondary | ICD-10-CM | POA: Diagnosis not present

## 2019-07-06 DIAGNOSIS — G4700 Insomnia, unspecified: Secondary | ICD-10-CM | POA: Diagnosis present

## 2019-07-06 DIAGNOSIS — R519 Headache, unspecified: Secondary | ICD-10-CM | POA: Diagnosis present

## 2019-07-06 DIAGNOSIS — G8929 Other chronic pain: Secondary | ICD-10-CM | POA: Diagnosis present

## 2019-07-06 DIAGNOSIS — Z825 Family history of asthma and other chronic lower respiratory diseases: Secondary | ICD-10-CM | POA: Diagnosis not present

## 2019-07-06 DIAGNOSIS — J45909 Unspecified asthma, uncomplicated: Secondary | ICD-10-CM | POA: Diagnosis present

## 2019-07-06 DIAGNOSIS — Z811 Family history of alcohol abuse and dependence: Secondary | ICD-10-CM | POA: Diagnosis not present

## 2019-07-06 DIAGNOSIS — Z90710 Acquired absence of both cervix and uterus: Secondary | ICD-10-CM

## 2019-07-06 DIAGNOSIS — R45851 Suicidal ideations: Secondary | ICD-10-CM | POA: Diagnosis present

## 2019-07-06 DIAGNOSIS — F909 Attention-deficit hyperactivity disorder, unspecified type: Secondary | ICD-10-CM | POA: Diagnosis not present

## 2019-07-06 DIAGNOSIS — Z888 Allergy status to other drugs, medicaments and biological substances status: Secondary | ICD-10-CM | POA: Diagnosis not present

## 2019-07-06 DIAGNOSIS — E039 Hypothyroidism, unspecified: Secondary | ICD-10-CM | POA: Diagnosis not present

## 2019-07-06 DIAGNOSIS — Z818 Family history of other mental and behavioral disorders: Secondary | ICD-10-CM | POA: Diagnosis not present

## 2019-07-06 DIAGNOSIS — Z803 Family history of malignant neoplasm of breast: Secondary | ICD-10-CM | POA: Diagnosis not present

## 2019-07-06 DIAGNOSIS — F319 Bipolar disorder, unspecified: Secondary | ICD-10-CM | POA: Diagnosis not present

## 2019-07-06 DIAGNOSIS — Z9049 Acquired absence of other specified parts of digestive tract: Secondary | ICD-10-CM | POA: Diagnosis not present

## 2019-07-06 DIAGNOSIS — F0280 Dementia in other diseases classified elsewhere without behavioral disturbance: Secondary | ICD-10-CM | POA: Diagnosis not present

## 2019-07-06 DIAGNOSIS — F410 Panic disorder [episodic paroxysmal anxiety] without agoraphobia: Secondary | ICD-10-CM | POA: Diagnosis present

## 2019-07-06 DIAGNOSIS — Z915 Personal history of self-harm: Secondary | ICD-10-CM | POA: Diagnosis not present

## 2019-07-06 DIAGNOSIS — G10 Huntington's disease: Secondary | ICD-10-CM | POA: Diagnosis present

## 2019-07-06 DIAGNOSIS — F22 Delusional disorders: Secondary | ICD-10-CM | POA: Diagnosis not present

## 2019-07-06 DIAGNOSIS — F329 Major depressive disorder, single episode, unspecified: Secondary | ICD-10-CM | POA: Diagnosis not present

## 2019-07-06 DIAGNOSIS — Z20822 Contact with and (suspected) exposure to covid-19: Secondary | ICD-10-CM | POA: Diagnosis not present

## 2019-07-06 LAB — SARS CORONAVIRUS 2 BY RT PCR (HOSPITAL ORDER, PERFORMED IN ~~LOC~~ HOSPITAL LAB): SARS Coronavirus 2: NEGATIVE

## 2019-07-06 LAB — TSH: TSH: 25.603 u[IU]/mL — ABNORMAL HIGH (ref 0.350–4.500)

## 2019-07-06 MED ORDER — MAGNESIUM HYDROXIDE 400 MG/5ML PO SUSP: 30.0000 mL | Freq: Every day | ORAL | Status: DC | PRN

## 2019-07-06 MED ORDER — PANTOPRAZOLE SODIUM 40 MG PO TBEC
40.0000 mg | DELAYED_RELEASE_TABLET | Freq: Two times a day (BID) | ORAL | Status: DC
  Administered 2019-07-07 – 2019-07-09 (×4): 40 mg via ORAL
  Filled 2019-07-06 (×4): qty 1

## 2019-07-06 MED ORDER — ALBUTEROL SULFATE HFA 108 (90 BASE) MCG/ACT IN AERS
2.0000 | INHALATION_SPRAY | Freq: Four times a day (QID) | RESPIRATORY_TRACT | Status: DC | PRN
  Filled 2019-07-06: qty 6.7

## 2019-07-06 MED ORDER — THYROID 60 MG PO TABS
60.0000 mg | ORAL_TABLET | Freq: Every day | ORAL | Status: DC
  Administered 2019-07-06 – 2019-07-09 (×4): 60 mg via ORAL
  Filled 2019-07-06 (×6): qty 1

## 2019-07-06 MED ORDER — LEVOTHYROXINE SODIUM 50 MCG PO TABS: 100.0000 ug | ORAL_TABLET | Freq: Every day | ORAL | Status: DC

## 2019-07-06 MED ORDER — ACETAMINOPHEN 325 MG PO TABS
650.0000 mg | ORAL_TABLET | Freq: Four times a day (QID) | ORAL | Status: DC | PRN
  Administered 2019-07-08: 650 mg via ORAL
  Filled 2019-07-06: qty 2

## 2019-07-06 MED ORDER — VENLAFAXINE HCL ER 75 MG PO CP24
75.0000 mg | ORAL_CAPSULE | Freq: Every day | ORAL | Status: DC
  Administered 2019-07-08 – 2019-07-09 (×2): 75 mg via ORAL
  Filled 2019-07-06 (×2): qty 1

## 2019-07-06 MED ORDER — ACETAMINOPHEN 500 MG PO TABS
1000.0000 mg | ORAL_TABLET | Freq: Once | ORAL | Status: AC
  Administered 2019-07-06: 1000 mg via ORAL
  Filled 2019-07-06: qty 2

## 2019-07-06 MED ORDER — PREDNISONE 10 MG PO TABS
10.0000 mg | ORAL_TABLET | Freq: Every day | ORAL | Status: DC
  Administered 2019-07-08: 10 mg via ORAL
  Filled 2019-07-06: qty 1

## 2019-07-06 MED ORDER — CLONAZEPAM 0.5 MG PO TABS: 0.5000 mg | ORAL_TABLET | Freq: Four times a day (QID) | ORAL | Status: DC

## 2019-07-06 MED ORDER — QUETIAPINE FUMARATE 200 MG PO TABS
400.0000 mg | ORAL_TABLET | Freq: Every day | ORAL | Status: DC
  Administered 2019-07-06 – 2019-07-07 (×2): 400 mg via ORAL
  Filled 2019-07-06 (×2): qty 2

## 2019-07-06 MED ORDER — BUTALBITAL-APAP-CAFF-COD 50-325-40-30 MG PO CAPS: 1.0000 | ORAL_CAPSULE | Freq: Four times a day (QID) | ORAL | Status: DC

## 2019-07-06 MED ORDER — QUETIAPINE FUMARATE 200 MG PO TABS
400.0000 mg | ORAL_TABLET | Freq: Every day | ORAL | Status: DC
  Administered 2019-07-06: 400 mg via ORAL
  Filled 2019-07-06: qty 2

## 2019-07-06 MED ORDER — TRAZODONE HCL 100 MG PO TABS: 100.0000 mg | ORAL_TABLET | Freq: Every day | ORAL | Status: DC

## 2019-07-06 MED ORDER — VENLAFAXINE HCL ER 75 MG PO CP24
300.0000 mg | ORAL_CAPSULE | Freq: Every day | ORAL | Status: DC
  Administered 2019-07-07 – 2019-07-09 (×3): 300 mg via ORAL
  Filled 2019-07-06 (×3): qty 4

## 2019-07-06 MED ORDER — LEVOTHYROXINE SODIUM 50 MCG PO TABS
100.0000 ug | ORAL_TABLET | Freq: Every day | ORAL | Status: DC
  Administered 2019-07-09: 100 ug via ORAL
  Filled 2019-07-06 (×2): qty 2

## 2019-07-06 MED ORDER — TRAZODONE HCL 100 MG PO TABS
100.0000 mg | ORAL_TABLET | Freq: Every day | ORAL | Status: DC
  Administered 2019-07-06: 100 mg via ORAL
  Filled 2019-07-06: qty 1

## 2019-07-06 MED ORDER — ALUM & MAG HYDROXIDE-SIMETH 200-200-20 MG/5ML PO SUSP: 30.0000 mL | ORAL | Status: DC | PRN

## 2019-07-06 MED ORDER — LORATADINE 10 MG PO TABS
10.0000 mg | ORAL_TABLET | Freq: Every day | ORAL | Status: DC
  Administered 2019-07-06 – 2019-07-09 (×3): 10 mg via ORAL
  Filled 2019-07-06 (×3): qty 1

## 2019-07-06 MED ORDER — BUTALBITAL-ASA-CAFF-CODEINE 50-325-40-30 MG PO CAPS: 1.0000 | ORAL_CAPSULE | ORAL | Status: DC | PRN

## 2019-07-06 MED ORDER — CLONAZEPAM 0.5 MG PO TABS
0.5000 mg | ORAL_TABLET | Freq: Four times a day (QID) | ORAL | Status: DC
  Administered 2019-07-06 – 2019-07-09 (×10): 0.5 mg via ORAL
  Filled 2019-07-06 (×10): qty 1

## 2019-07-06 NOTE — BHH Counselor (Signed)
LCSW Group Therapy Note  07/06/2019 1:00 PM  Type of Therapy/Topic:  Group Therapy:  Emotion Regulation  Participation Level:  Did Not Attend   Description of Group:   The purpose of this group is to assist patients in learning to regulate negative emotions and experience positive emotions. Patients will be guided to discuss ways in which they have been vulnerable to their negative emotions. These vulnerabilities will be juxtaposed with experiences of positive emotions or situations, and patients will be challenged to use positive emotions to combat negative ones. Special emphasis will be placed on coping with negative emotions in conflict situations, and patients will process healthy conflict resolution skills.  Therapeutic Goals: 1. Patient will identify two positive emotions or experiences to reflect on in order to balance out negative emotions 2. Patient will label two or more emotions that they find the most difficult to experience 3. Patient will demonstrate positive conflict resolution skills through discussion and/or role plays  Summary of Patient Progress: X  Therapeutic Modalities:   Cognitive Behavioral Therapy Feelings Identification Dialectical Behavioral Therapy  Assunta Curtis, MSW, LCSW 07/06/2019 2:21 PM

## 2019-07-06 NOTE — Plan of Care (Signed)
  Problem: Safety: Goal: Periods of time without injury will increase Outcome: Progressing   

## 2019-07-06 NOTE — Progress Notes (Signed)
Pt was withdrawn all day She came out late afternoon and ate dinner. She was pleasant and non compliant. Collier Bullock RN
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
## 2019-07-06 NOTE — ED Notes (Signed)
Patient was accepted for admission to the BMU, room 319, accepting physician is Clapacs, MD.   Case was staffed with Kennyth Lose, NP and Karma Greaser, MD in the ED.   Patient Access was alerted about discharge as well as patient ED nurse, Maudie Mercury, RN.   Airline pilot is, [place], Therapist, sports in the BMU.   Patient to be admitted ASAP.

## 2019-07-06 NOTE — Progress Notes (Signed)
Recreation Therapy Notes  INPATIENT RECREATION THERAPY ASSESSMENT  Patient Details Name: Aparna Aispuro MRN: [id number] DOB: 1980-05-03 Today's Date: 07/06/2019       Information Obtained From: Patient  Able to Participate in Assessment/Interview: Yes  Patient Presentation: Responsive  Reason for Admission (Per Patient): Aggressive/Threatening  Patient Stressors:    Coping Skills:   Meditate, Deep Breathing, Talk, Read, Other (Comment)(Rest)  Leisure Interests (2+):  Individual - Reading, Individual - TV, Art - Draw, Petra [place] - Flower gardening  Frequency of Recreation/Participation:    Awareness of Community Resources:     [employer]:     Current Use:    If no, Barriers?:    Expressed Interest in [place] of Residence:  Insurance underwriter  Patient Main Form of Transportation: Other (Comment)(Family)  Patient Strengths:  Putting others first  Patient Identified Areas of Improvement:  My anger  Patient Goal for Hospitalization:  To get some rest  Current SI (including self-harm):  No  Current HI:  No  Current AVH: No  Staff Intervention Plan: Group Attendance, Collaborate with Interdisciplinary Treatment Team  Consent to Intern Participation: N/A  Prashant Glosser 07/06/2019, 4:26 PM

## 2019-07-06 NOTE — Tx Team (Signed)
Initial Treatment Plan 07/06/2019 5:01 AM Heather King [id number]    PATIENT STRESSORS: Medication change or noncompliance Substance abuse   PATIENT STRENGTHS: Ability for insight Motivation for treatment/growth   PATIENT IDENTIFIED PROBLEMS: Depression     Bipolar Disease                  DISCHARGE CRITERIA:  Improved stabilization in mood, thinking, and/or behavior Motivation to continue treatment in a less acute level of care  PRELIMINARY DISCHARGE PLAN: Outpatient therapy  PATIENT/FAMILY INVOLVEMENT: This treatment plan has been presented to and reviewed with the patient, Heather King [name] patient and family have been given the opportunity to ask questions and make suggestions.  Harl Bowie, RN 07/06/2019, 5:01 AM
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
## 2019-07-06 NOTE — Progress Notes (Signed)
Patient is pleasant and easy to engage. Has been active this evening with peers on th unit as the converse over snack.  Patient received prescribed medication and tolerated without incident. Patient informed to contact staff with any concerns and remains safe on the unit with safety rounds.
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
## 2019-07-06 NOTE — Progress Notes (Signed)
Recreation Therapy Notes     Date: 07/06/2019  Time: 9:30 am   Location: Craft room    Behavioral response: N/A   Intervention Topic: Goals  Discussion/Intervention: Patient did not attend group.   Clinical Observations/Feedback:  Patient did not attend group.   Shaverence Outlaw LRT/CTRS         Shaverence  Outlaw 07/06/2019 10:50 AM
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
## 2019-07-06 NOTE — ED Provider Notes (Signed)
[facility] Emergency Department Provider Note  ____________________________________________   First MD Initiated Contact with Patient 07/06/19 0023     (approximate)  I have reviewed the triage vital signs and the nursing notes.   HISTORY  Chief Complaint Mental Health Problem  Level 5 caveat:  history/ROS limited by active psychosis / mental illness / altered mental status / intoxication   HPI Heather King is a 39 y.o. female with a diagnosis 2 years ago of Huntington's disease as well as other medical and psychiatric issues as listed below.  She presents tonight under involuntary commitment by law enforcement due to aggressive behavior towards her daughter.  The patient admits to acting out and being depressed recently with having thoughts of suicide.  She admits to some alcohol use earlier today.  She said that she feels okay now except for a generalized headache and she said that she usually takes headache medicine.  Nothing particular makes his symptoms better or worse and she has been gradually getting worse over time and they were severe earlier tonight.  She denies sore throat, chest pain, shortness of breath, nausea, vomiting, and abdominal pain.         Past Medical History:  Diagnosis Date  . ADD (attention deficit disorder)   . Anxiety   . Bipolar disorder ([place])   . Cancer (HCC)    Cervical Cancer  . Depression   . GERD (gastroesophageal reflux disease)   . Huntington's disease ([place])   . Insomnia due to medical condition     Patient Active Problem List   Diagnosis Date Noted  . Severe recurrent major depression without psychotic features ([place]) 06/25/2017  . Anxiety disorder 07/19/2015  . Huntington's disease ([place]) 07/19/2015  . Chronic low back pain 07/19/2015  . Chronic headaches 07/19/2015  . GERD (gastroesophageal reflux disease) 07/19/2015  . ADD (attention deficit disorder) 07/19/2015  . Insomnia due to medical condition  05/11/2015    Past Surgical History:  Procedure Laterality Date  . ABDOMINAL HYSTERECTOMY  2013  . CHOLECYSTECTOMY  2012  . LEEP  2007    Prior to Admission medications   Medication Sig Start Date End Date Taking? Authorizing Provider  amphetamine-dextroamphetamine (ADDERALL) 20 MG tablet Take 1 tablet (20 mg total) by mouth 2 (two) times daily. 06/02/19 06/01/20 Yes Clapacs, Madie Reno, MD  ARMOUR THYROID 60 MG tablet Take 1 tablet by mouth daily. 05/16/19  Yes [provider]  butalbital-acetaminophen-caffeine (FIORICET WITH CODEINE) 50-325-40-30 MG capsule Take 1 capsule by mouth 4 (four) times daily. 07/04/19  Yes [provider]  cetirizine (ZYRTEC) 10 MG tablet Take 1 tablet (10 mg total) by mouth daily. Patient taking differently: Take 10 mg by mouth daily. Patient takes PRN 06/28/17 01/24/20 Yes Pucilowska, Jolanta B, MD  clonazePAM (KLONOPIN) 0.5 MG tablet Take 1 tablet (0.5 mg total) by mouth 4 (four) times daily. 06/02/19  Yes Clapacs, Madie Reno, MD  pantoprazole (PROTONIX) 40 MG tablet Take 1 tablet (40 mg total) by mouth 2 (two) times daily. Patient taking differently: Take 40 mg by mouth daily.  12/07/18  Yes Jearld Fenton, NP  QUEtiapine (SEROQUEL) 200 MG tablet Take 2 tablets (400 mg total) by mouth at bedtime. 06/02/19  Yes Clapacs, Madie Reno, MD  traZODone (DESYREL) 100 MG tablet Take 1 tablet (100 mg total) by mouth at bedtime. 06/02/19  Yes Clapacs, Madie Reno, MD  venlafaxine XR (EFFEXOR-XR) 150 MG 24 hr capsule Take 2 capsules (300 mg total) by  mouth daily with breakfast. 06/02/19  Yes Clapacs, Madie Reno, MD  venlafaxine XR (EFFEXOR-XR) 75 MG 24 hr capsule Take 1 capsule (75 mg total) by mouth daily with breakfast. 06/02/19  Yes Clapacs, Madie Reno, MD  albuterol (VENTOLIN HFA) 108 (90 Base) MCG/ACT inhaler Inhale 2 puffs into the lungs every 6 (six) hours as needed for wheezing or shortness of breath. Patient not taking: Reported on 07/06/2019 03/09/19   Jearld Fenton, NP    amphetamine-dextroamphetamine (ADDERALL) 20 MG tablet Take 1 tablet (20 mg total) by mouth 2 (two) times daily. Patient not taking: Reported on 07/06/2019 06/02/19 06/01/20  Clapacs, Madie Reno, MD  butalbital-aspirin-caffeine-codeine (FIORINAL/CODEINE #3) [phone] MG capsule Take 1 capsule by mouth every 4 (four) hours as needed for pain or headache. Patient not taking: Reported on 07/06/2019 06/02/19   Clapacs, Madie Reno, MD  HYDROcodone-homatropine [facility]) 5-1.5 MG/5ML syrup Take 5 mLs by mouth every 8 (eight) hours as needed for cough. Patient not taking: Reported on 07/06/2019 03/09/19   Jearld Fenton, NP  levothyroxine (SYNTHROID) 50 MCG tablet TAKE 2 TABLETS (100 MCG TOTAL) BY MOUTH DAILY. 03/10/19   Jearld Fenton, NP  predniSONE (DELTASONE) 10 MG tablet Take 6 tabs day 1, 5 tabs day 2, 4 tabs day 3, 3 tabs day 4, 2 tabs day 5, 1 tab day 6 Patient not taking: Reported on 07/06/2019 03/09/19   Jearld Fenton, NP    Allergies Abilify [aripiprazole], Gabapentin, and Nifedipine  Family History  Problem Relation Age of Onset  . Hypertension Mother   . Diabetes Mother   . Mental illness Mother   . Depression Mother   . Cancer Father        lung  . Alcohol abuse Father   . Dementia Maternal Grandmother   . Cancer Maternal Grandmother        breast  . COPD Maternal Grandmother   . Depression Maternal Grandmother   . Dementia Maternal Grandfather   . Cancer Maternal Grandfather        Lung  . Diabetes Brother   . ADD / ADHD Brother   . Breast cancer Maternal Aunt 69    Social History Social History   Tobacco Use  . Smoking status: Never Smoker  . Smokeless tobacco: Never Used  Substance Use Topics  . Alcohol use: Yes    Comment: occasional  . Drug use: No    Review of Systems Level 5 caveat:  history/ROS limited by active psychosis / mental illness / altered mental status / intoxication  Constitutional: No fever/chills Eyes: No visual changes. ENT: No sore  throat. Cardiovascular: Denies chest pain. Respiratory: Denies shortness of breath. Gastrointestinal: No abdominal pain.  No nausea, no vomiting.  No diarrhea.  No constipation. Genitourinary: Negative for dysuria. Musculoskeletal: Negative for neck pain.  Negative for back pain. Integumentary: Negative for rash. Neurological: Generalized headache, no focal weakness or numbness. Psychiatric:  Depression, suicidal ideation, violent behavior, alcohol use.  ____________________________________________   PHYSICAL EXAM:  VITAL SIGNS: ED Triage Vitals  Enc Vitals Group     BP 07/05/19 2246 108/69     Pulse Rate 07/05/19 2246 98     Resp 07/05/19 2251 18     Temp 07/05/19 2246 98.3 F (36.8 C)     Temp Source 07/05/19 2246 Oral     SpO2 07/05/19 2246 99 %     Weight --      Height --      Head Circumference --  Peak Flow --      Pain Score --      Pain Loc --      Pain Edu? --      Excl. in [place]? --     Constitutional: Alert and oriented.  Eyes: Conjunctivae are normal.  Head: Atraumatic. Nose: No congestion/rhinnorhea. Mouth/Throat: Patient is wearing a mask. Neck: No stridor.  No meningeal signs.   Cardiovascular: Normal rate, regular rhythm. Good peripheral circulation. Grossly normal heart sounds. Respiratory: Normal respiratory effort.  No retractions. Gastrointestinal: Soft and nontender. No distention.  Musculoskeletal: No lower extremity tenderness nor edema. No gross deformities of extremities. Neurologic:  Normal speech and language. No gross focal neurologic deficits are appreciated.  Skin:  Skin is warm, dry and intact. Psychiatric: Mood and affect are normal. Speech and behavior are normal.  Patient is calm and cooperative and recognizes that she needs help.  ____________________________________________   LABS (all labs ordered are listed, but only abnormal results are displayed)  Labs Reviewed  COMPREHENSIVE METABOLIC PANEL - Abnormal; Notable for the  following components:      Result Value   Glucose, Bld 109 (*)    All other components within normal limits  ETHANOL - Abnormal; Notable for the following components:   Alcohol, Ethyl (B) 89 (*)    All other components within normal limits  SALICYLATE LEVEL - Abnormal; Notable for the following components:   Salicylate Lvl [id number] (*)    All other components within normal limits  ACETAMINOPHEN LEVEL - Abnormal; Notable for the following components:   Acetaminophen (Tylenol), Serum <10 (*)    All other components within normal limits  SARS CORONAVIRUS 2 BY RT PCR (HOSPITAL ORDER, [facility])  CBC  URINE DRUG SCREEN, QUALITATIVE ([place])  POC URINE PREG, ED  POCT PREGNANCY, URINE   ____________________________________________  EKG  No indication for emergent EKG   ____________________________________________  RADIOLOGY I, Hinda Kehr, personally viewed and evaluated these images (plain radiographs) as part of my medical decision making, as well as reviewing the written report by the radiologist.  ED MD interpretation: No indication for emergent imaging  Official radiology report(s): No results found.  ____________________________________________   PROCEDURES   Procedure(s) performed (including Critical Care):  Procedures   ____________________________________________   INITIAL IMPRESSION / MDM / ASSESSMENT AND PLAN / ED COURSE  As part of my medical decision making, I reviewed the following data within the [facility] notes reviewed and incorporated, Labs reviewed , Old chart reviewed, A consult was requested and obtained from this/these consultant(s) Psychiatry and Notes from prior ED visits   Differential diagnosis includes, but is not limited to, depression, adjustment disorder, mood disorder, complication from Huntington's disease.  No indication of acute medical issue.  The patient is mildly intoxicated  and admits to drinking earlier today.  No acute lab abnormalities including no evidence of overdose.  Hemodynamically stable.  Medically cleared for psychiatric disposition.  The patient has been placed in psychiatric observation due to the need to provide a safe environment for the patient while obtaining psychiatric consultation and evaluation, as well as ongoing medical and medication management to treat the patient's condition.  The patient has been placed under full IVC at this time.       Clinical Course as of Jul 05 209  Wed Jul 06, 2019  0106 Discussed case in person with Kennyth Lose with psychiatry who assessed the patient in the ED.  She feels the  patient meets criteria for admission and will plan to admit her to the behavioral unit tonight.   [CF]    Clinical Course User Index [CF] Hinda Kehr, MD     ____________________________________________  FINAL CLINICAL IMPRESSION(S) / ED DIAGNOSES  Final diagnoses:  Depression, unspecified depression type  Huntington's disease ([place])     MEDICATIONS GIVEN DURING THIS VISIT:  Medications  traZODone (DESYREL) tablet 100 mg (has no administration in time range)  QUEtiapine (SEROQUEL) tablet 400 mg (has no administration in time range)  clonazePAM (KLONOPIN) tablet 0.5 mg (has no administration in time range)  acetaminophen (TYLENOL) tablet 1,000 mg (1,000 mg Oral Given 07/06/19 0140)     ED Discharge Orders    None      *Please note:  Tannia Nikolina Tanori was evaluated in Emergency Department on 07/06/2019 for the symptoms described in the history of present illness. She was evaluated in the context of the global COVID-19 pandemic, which necessitated consideration that the patient might be at risk for infection with the SARS-CoV-2 virus that causes COVID-19. Institutional protocols and algorithms that pertain to the evaluation of patients at risk for COVID-19 are in a state of rapid change based on information released by  regulatory bodies including the CDC and federal and state organizations. These policies and algorithms were followed during the patient's care in the ED.  Some ED evaluations and interventions may be delayed as a result of limited staffing during the pandemic.*  Note:  This document was prepared using Dragon voice recognition software and may include unintentional dictation errors.   Hinda Kehr, MD 07/06/19 [phone]

## 2019-07-06 NOTE — Plan of Care (Signed)
Pt rates depression and hopelessness both 8/10. Pt rates anxiety 10/10. Pt denies SI, HI and AVH. Pt was educated on care plan and verbalizes understanding. Collier Bullock RN Problem: Education: Goal: Knowledge of [facility] Education information/materials will improve Outcome: Not Progressing Goal: Emotional status will improve Outcome: Not Progressing Goal: Mental status will improve Outcome: Not Progressing Goal: Verbalization of understanding the information provided will improve Outcome: Not Progressing   Problem: Activity: Goal: Interest or engagement in activities will improve Outcome: Not Progressing Goal: Sleeping patterns will improve Outcome: Not Progressing   Problem: Coping: Goal: Ability to verbalize frustrations and anger appropriately will improve Outcome: Not Progressing Goal: Ability to demonstrate self-control will improve Outcome: Not Progressing   Problem: Health Behavior/Discharge Planning: Goal: Identification of resources available to assist in meeting health care needs will improve Outcome: Not Progressing Goal: Compliance with treatment plan for underlying cause of condition will improve Outcome: Not Progressing   Problem: Physical Regulation: Goal: Ability to maintain clinical measurements within normal limits will improve Outcome: Not Progressing   Problem: Safety: Goal: Periods of time without injury will increase Outcome: Not Progressing   Problem: Education: Goal: Ability to state activities that reduce stress will improve Outcome: Not Progressing   Problem: Coping: Goal: Ability to identify and develop effective coping behavior will improve Outcome: Not Progressing   Problem: Self-Concept: Goal: Ability to identify factors that promote anxiety will improve Outcome: Not Progressing Goal: Level of anxiety will decrease Outcome: Not Progressing Goal: Ability to modify response to factors that promote anxiety will improve Outcome:  Not Progressing   Problem: Education: Goal: Utilization of techniques to improve thought processes will improve Outcome: Not Progressing Goal: Knowledge of the prescribed therapeutic regimen will improve Outcome: Not Progressing   Problem: Activity: Goal: Interest or engagement in leisure activities will improve Outcome: Not Progressing Goal: Imbalance in normal sleep/wake cycle will improve Outcome: Not Progressing   Problem: Coping: Goal: Coping ability will improve Outcome: Not Progressing Goal: Will verbalize feelings Outcome: Not Progressing   Problem: Health Behavior/Discharge Planning: Goal: Ability to make decisions will improve Outcome: Not Progressing Goal: Compliance with therapeutic regimen will improve Outcome: Not Progressing   Problem: Role Relationship: Goal: Will demonstrate positive changes in social behaviors and relationships Outcome: Not Progressing   Problem: Safety: Goal: Ability to disclose and discuss suicidal ideas will improve Outcome: Not Progressing Goal: Ability to identify and utilize support systems that promote safety will improve Outcome: Not Progressing   Problem: Self-Concept: Goal: Will verbalize positive feelings about self Outcome: Not Progressing Goal: Level of anxiety will decrease Outcome: Not Progressing

## 2019-07-06 NOTE — [facility] Suicide Risk Assessment (Signed)
[place] INPATIENT:  Family/Significant Other Suicide Prevention Education  Suicide Prevention Education:  Patient Refusal for Family/Significant Other Suicide Prevention Education: The patient Heather King has refused to provide written consent for family/significant other to be provided Family/Significant Other Suicide Prevention Education during admission and/or prior to discharge.  Physician notified.  SPE completed with pt, as pt refused to consent to family contact. SPI pamphlet provided to pt and pt was encouraged to share information with support network, ask questions, and talk about any concerns relating to SPE. Pt denies access to guns/firearms and verbalized understanding of information provided. Mobile Crisis information also provided to pt.   Rozann Lesches 07/06/2019, 12:59 PM
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
## 2019-07-07 NOTE — Tx Team (Signed)
Interdisciplinary Treatment and Diagnostic Plan Update  07/07/2019 Time of Session: 9am Heather King MRN: [id number]  Principal Diagnosis: MDD (major depressive disorder), recurrent episode, severe ([place])  Secondary Diagnoses: Principal Problem:   MDD (major depressive disorder), recurrent episode, severe ([place]) Active Problems:   Anxiety disorder   Chronic headaches   GERD (gastroesophageal reflux disease)   Hypothyroidism   Alcohol abuse   Current Medications:  Current Facility-Administered Medications  Medication Dose Route Frequency Provider Last Rate Last Admin  . acetaminophen (TYLENOL) tablet 650 mg  650 mg Oral Q6H PRN Caroline Sauger, NP      . albuterol (VENTOLIN HFA) 108 (90 Base) MCG/ACT inhaler 2 puff  2 puff Inhalation Q6H PRN Caroline Sauger, NP      . alum & mag hydroxide-simeth (MAALOX/MYLANTA) 200-200-20 MG/5ML suspension 30 mL  30 mL Oral Q4H PRN Caroline Sauger, NP      . clonazePAM Bobbye Charleston) tablet 0.5 mg  0.5 mg Oral QID Caroline Sauger, NP   0.5 mg at 07/07/19 1434  . levothyroxine (SYNTHROID) tablet 100 mcg  100 mcg Oral Q0600 Clapacs, John T, MD      . loratadine (CLARITIN) tablet 10 mg  10 mg Oral Daily Caroline Sauger, NP   10 mg at 07/06/19 1753  . magnesium hydroxide (MILK OF MAGNESIA) suspension 30 mL  30 mL Oral Daily PRN Caroline Sauger, NP      . pantoprazole (PROTONIX) EC tablet 40 mg  40 mg Oral BID Caroline Sauger, NP   40 mg at 07/07/19 0818  . predniSONE (DELTASONE) tablet 10 mg  10 mg Oral Q breakfast Caroline Sauger, NP      . QUEtiapine (SEROQUEL) tablet 400 mg  400 mg Oral QHS Caroline Sauger, NP   400 mg at 07/06/19 2141  . thyroid (ARMOUR) tablet 60 mg  60 mg Oral Daily Caroline Sauger, NP   60 mg at 07/07/19 0818  . venlafaxine XR (EFFEXOR-XR) 24 hr capsule 300 mg  300 mg Oral Q breakfast Caroline Sauger, NP   300 mg at 07/07/19 0819  . venlafaxine XR (EFFEXOR-XR) 24 hr capsule 75  mg  75 mg Oral Q breakfast Caroline Sauger, NP       PTA Medications: Medications Prior to Admission  Medication Sig Dispense Refill Last Dose  . albuterol (VENTOLIN HFA) 108 (90 Base) MCG/ACT inhaler Inhale 2 puffs into the lungs every 6 (six) hours as needed for wheezing or shortness of breath. (Patient not taking: Reported on 07/06/2019) 8 g 0   . amphetamine-dextroamphetamine (ADDERALL) 20 MG tablet Take 1 tablet (20 mg total) by mouth 2 (two) times daily. 60 tablet 0   . amphetamine-dextroamphetamine (ADDERALL) 20 MG tablet Take 1 tablet (20 mg total) by mouth 2 (two) times daily. (Patient not taking: Reported on 07/06/2019) 60 tablet 0   . ARMOUR THYROID 60 MG tablet Take 1 tablet by mouth daily.     . butalbital-acetaminophen-caffeine (FIORICET WITH CODEINE) 50-325-40-30 MG capsule Take 1 capsule by mouth 4 (four) times daily.     . butalbital-aspirin-caffeine-codeine (FIORINAL/CODEINE #3) 50-325-40-30 MG capsule Take 1 capsule by mouth every 4 (four) hours as needed for pain or headache. (Patient not taking: Reported on 07/06/2019) 120 capsule 1   . cetirizine (ZYRTEC) 10 MG tablet Take 1 tablet (10 mg total) by mouth daily. (Patient taking differently: Take 10 mg by mouth daily. Patient takes PRN) 30 tablet 11   . clonazePAM (KLONOPIN) 0.5 MG tablet Take 1 tablet (0.5 mg total) by mouth  4 (four) times daily. 120 tablet 1   . HYDROcodone-homatropine (HYCODAN) 5-1.5 MG/5ML syrup Take 5 mLs by mouth every 8 (eight) hours as needed for cough. (Patient not taking: Reported on 07/06/2019) 120 mL 0   . levothyroxine (SYNTHROID) 50 MCG tablet TAKE 2 TABLETS (100 MCG TOTAL) BY MOUTH DAILY. 60 tablet 0   . pantoprazole (PROTONIX) 40 MG tablet Take 1 tablet (40 mg total) by mouth 2 (two) times daily. (Patient taking differently: Take 40 mg by mouth daily. ) 60 tablet 3   . predniSONE (DELTASONE) 10 MG tablet Take 6 tabs day 1, 5 tabs day 2, 4 tabs day 3, 3 tabs day 4, 2 tabs day 5, 1 tab day 6  (Patient not taking: Reported on 07/06/2019) 21 tablet 0   . QUEtiapine (SEROQUEL) 200 MG tablet Take 2 tablets (400 mg total) by mouth at bedtime. 60 tablet 1   . traZODone (DESYREL) 100 MG tablet Take 1 tablet (100 mg total) by mouth at bedtime. 30 tablet 1   . venlafaxine XR (EFFEXOR-XR) 150 MG 24 hr capsule Take 2 capsules (300 mg total) by mouth daily with breakfast. 60 capsule 1   . venlafaxine XR (EFFEXOR-XR) 75 MG 24 hr capsule Take 1 capsule (75 mg total) by mouth daily with breakfast. 30 capsule 1     Patient Stressors: Medication change or noncompliance Substance abuse  Patient Strengths: Ability for insight Motivation for treatment/growth  Treatment Modalities: Medication Management, Group therapy, Case management,  1 to 1 session with clinician, Psychoeducation, Recreational therapy.   Physician Treatment Plan for Primary Diagnosis: MDD (major depressive disorder), recurrent episode, severe ([place]) Long Term Goal(s): Improvement in symptoms so as ready for discharge Improvement in symptoms so as ready for discharge   Short Term Goals: Ability to disclose and discuss suicidal ideas Ability to demonstrate self-control will improve Ability to verbalize feelings will improve Ability to maintain clinical measurements within normal limits will improve Compliance with prescribed medications will improve  Medication Management: Evaluate patient's response, side effects, and tolerance of medication regimen.  Therapeutic Interventions: 1 to 1 sessions, Unit Group sessions and Medication administration.  Evaluation of Outcomes: Not Met  Physician Treatment Plan for Secondary Diagnosis: Principal Problem:   MDD (major depressive disorder), recurrent episode, severe ([facility]) Active Problems:   Anxiety disorder   Chronic headaches   GERD (gastroesophageal reflux disease)   Hypothyroidism   Alcohol abuse  Long Term Goal(s): Improvement in symptoms so as ready for  discharge Improvement in symptoms so as ready for discharge   Short Term Goals: Ability to disclose and discuss suicidal ideas Ability to demonstrate self-control will improve Ability to verbalize feelings will improve Ability to maintain clinical measurements within normal limits will improve Compliance with prescribed medications will improve     Medication Management: Evaluate patient's response, side effects, and tolerance of medication regimen.  Therapeutic Interventions: 1 to 1 sessions, Unit Group sessions and Medication administration.  Evaluation of Outcomes: Not Met   RN Treatment Plan for Primary Diagnosis: MDD (major depressive disorder), recurrent episode, severe ([place]) Long Term Goal(s): Knowledge of disease and therapeutic regimen to maintain health will improve  Short Term Goals: Ability to participate in decision making will improve, Ability to verbalize feelings will improve, Ability to identify and develop effective coping behaviors will improve and Compliance with prescribed medications will improve  Medication Management: RN will administer medications as ordered by provider, will assess and evaluate patient's response and provide education to patient for prescribed medication.  RN will report any adverse and/or side effects to prescribing provider.  Therapeutic Interventions: 1 on 1 counseling sessions, Psychoeducation, Medication administration, Evaluate responses to treatment, Monitor vital signs and CBGs as ordered, Perform/monitor CIWA, COWS, AIMS and Fall Risk screenings as ordered, Perform wound care treatments as ordered.  Evaluation of Outcomes: Not Met   LCSW Treatment Plan for Primary Diagnosis: MDD (major depressive disorder), recurrent episode, severe ([place]) Long Term Goal(s): Safe transition to appropriate next level of care at discharge, Engage patient in therapeutic group addressing interpersonal concerns.  Short Term Goals: Engage patient in aftercare  planning with referrals and resources  Therapeutic Interventions: Assess for all discharge needs, 1 to 1 time with Social worker, Explore available resources and support systems, Assess for adequacy in community support network, Educate family and significant other(s) on suicide prevention, Complete Psychosocial Assessment, Interpersonal group therapy.  Evaluation of Outcomes: Not Met   Progress in Treatment: Attending groups: No. Participating in groups: No. Taking medication as prescribed: Yes. Toleration medication: Yes. Family/Significant other contact made: Yes, individual(s) contacted:  pt declined family contact Patient understands diagnosis: Yes. Discussing patient identified problems/goals with staff: Yes. Medical problems stabilized or resolved: No. Denies suicidal/homicidal ideation: Yes. Issues/concerns per patient self-inventory: No. Other: NA  New problem(s) identified: No, Describe:  none reported  New Short Term/Long Term Goal(s):Attend outpatient treatment, take medication as prescribed, develop and implement healthy coping methods.  Patient Goals:  "I needed to get some rest"  Discharge Plan or Barriers: Pt will return home and follow up with outpatient treatment.  Reason for Continuation of Hospitalization: Depression Medication stabilization  Estimated Length of Stay:1-7 days  Attendees: Patient:Heather King 07/07/2019 3:48 PM  Physician: Alethia Berthold 07/07/2019 3:48 PM  Nursing: Junita Push Manuaritti 07/07/2019 3:48 PM  RN Care Manager: 07/07/2019 3:48 PM  Social Worker: Anise Salvo 07/07/2019 3:48 PM  Recreational Therapist: Roanna Epley 07/07/2019 3:48 PM  Other:  07/07/2019 3:48 PM  Other:  07/07/2019 3:48 PM  Other: 07/07/2019 3:48 PM    Scribe for Treatment Team: Yvette Rack, LCSW 07/07/2019 3:48 PM

## 2019-07-07 NOTE — Progress Notes (Signed)
Pt is alert and oriented to person, place, time and to situation. Pt is calm, cooperative, mildly irritable at times, denies suicidal and homicidal ideation, denies hallucinations, denies feeling depressed or anxious, instead reports she needs rest and spends a good deal of time resting in her room in bed, quietly, minimally interactive with staff or peers, out for meds and meals, is guarded, spends some time talking on the phone, affect is flat but brightens some on approach. No distress noted, none reported, will continue to monitor pt per Q15 minute face checks and monitor for safety and progress.

## 2019-07-07 NOTE — Progress Notes (Signed)
[facility] MD Progress Note  07/07/2019 11:48 AM Heather King  MRN:  [id number] Subjective: Patient seen chart reviewed.  Patient continues to say she feels depressed and anxious.  She got a decent night sleep last night and so feels a little bit better.  She was able to attend treatment team today.  Individually however she still became tearful.  She had a talk with her daughter today that made her feel even more sad and negative about herself.  Not having any obvious withdrawal symptoms right now but still very dysphoric. Principal Problem: MDD (major depressive disorder), recurrent episode, severe ([place]) Diagnosis: Principal Problem:   MDD (major depressive disorder), recurrent episode, severe (H[facility]) Active Problems:   Anxiety disorder   Chronic headaches   GERD (gastroesophageal reflux disease)   Hypothyroidism   Alcohol abuse  Total Time spent with patient: 30 minutes  Past Psychiatric History: Past history of chronic anxiety depression and I suspect more substance abuse than she acknowledges.  Past Medical History:  Past Medical History:  Diagnosis Date  . ADD (attention deficit disorder)   . Anxiety   . Bipolar disorder ([place])   . Cancer (HCC)    Cervical Cancer  . Depression   . GERD (gastroesophageal reflux disease)   . Huntington's disease ([place])   . Insomnia due to medical condition     Past Surgical History:  Procedure Laterality Date  . ABDOMINAL HYSTERECTOMY  2013  . CHOLECYSTECTOMY  2012  . LEEP  2007   Family History:  Family History  Problem Relation Age of Onset  . Hypertension Mother   . Diabetes Mother   . Mental illness Mother   . Depression Mother   . Cancer Father        lung  . Alcohol abuse Father   . Dementia Maternal Grandmother   . Cancer Maternal Grandmother        breast  . COPD Maternal Grandmother   . Depression Maternal Grandmother   . Dementia Maternal Grandfather   . Cancer Maternal Grandfather        Lung  . Diabetes Brother   .  ADD / ADHD Brother   . Breast cancer Maternal Aunt 45   Family Psychiatric  History: See previous Social History:  Social History   Substance and Sexual Activity  Alcohol Use Yes   Comment: occasional     Social History   Substance and Sexual Activity  Drug Use No    Social History   Socioeconomic History  . Marital status: Married    Spouse name: Not on file  . Number of children: 1  . Years of education: Not on file  . Highest education level: Some college, no degree  Occupational History  . Not on file  Tobacco Use  . Smoking status: Never Smoker  . Smokeless tobacco: Never Used  Substance and Sexual Activity  . Alcohol use: Yes    Comment: occasional  . Drug use: No  . Sexual activity: Not on file  Other Topics Concern  . Not on file  Social History Narrative  . Not on file   Social Determinants of Health   Financial Resource Strain:   . Difficulty of Paying Living Expenses:   Food Insecurity:   . Worried About Charity fundraiser in the Last Year:   . Arboriculturist in the Last Year:   Transportation Needs:   . Film/video editor (Medical):   Marland Kitchen Lack of Transportation (Non-Medical):  Physical Activity:   . Days of Exercise per Week:   . Minutes of Exercise per Session:   Stress:   . Feeling of Stress :   Social Connections:   . Frequency of Communication with Friends and Family:   . Frequency of Social Gatherings with Friends and Family:   . Attends Religious Services:   . Active Member of Clubs or Organizations:   . Attends Archivist Meetings:   Marland Kitchen Marital Status:    Additional Social History:                         Sleep: Fair  Appetite:  Fair  Current Medications: Current Facility-Administered Medications  Medication Dose Route Frequency Provider Last Rate Last Admin  . acetaminophen (TYLENOL) tablet 650 mg  650 mg Oral Q6H PRN Caroline Sauger, NP      . albuterol (VENTOLIN HFA) 108 (90 Base) MCG/ACT  inhaler 2 puff  2 puff Inhalation Q6H PRN Caroline Sauger, NP      . alum & mag hydroxide-simeth (MAALOX/MYLANTA) 200-200-20 MG/5ML suspension 30 mL  30 mL Oral Q4H PRN Caroline Sauger, NP      . clonazePAM Bobbye Charleston) tablet 0.5 mg  0.5 mg Oral QID Caroline Sauger, NP   0.5 mg at 07/07/19 0820  . levothyroxine (SYNTHROID) tablet 100 mcg  100 mcg Oral Q0600 Damon Baisch T, MD      . loratadine (CLARITIN) tablet 10 mg  10 mg Oral Daily Caroline Sauger, NP   10 mg at 07/06/19 1753  . magnesium hydroxide (MILK OF MAGNESIA) suspension 30 mL  30 mL Oral Daily PRN Caroline Sauger, NP      . pantoprazole (PROTONIX) EC tablet 40 mg  40 mg Oral BID Caroline Sauger, NP   40 mg at 07/07/19 0818  . predniSONE (DELTASONE) tablet 10 mg  10 mg Oral Q breakfast Caroline Sauger, NP      . QUEtiapine (SEROQUEL) tablet 400 mg  400 mg Oral QHS Caroline Sauger, NP   400 mg at 07/06/19 2141  . thyroid (ARMOUR) tablet 60 mg  60 mg Oral Daily Caroline Sauger, NP   60 mg at 07/07/19 0818  . venlafaxine XR (EFFEXOR-XR) 24 hr capsule 300 mg  300 mg Oral Q breakfast Caroline Sauger, NP   300 mg at 07/07/19 0819  . venlafaxine XR (EFFEXOR-XR) 24 hr capsule 75 mg  75 mg Oral Q breakfast Caroline Sauger, NP        Lab Results:  Results for orders placed or performed during the hospital encounter of 07/06/19 (from the past 48 hour(s))  TSH     Status: Abnormal   Collection Time: 07/05/19 10:56 PM  Result Value Ref Range   TSH 25.603 (H) 0.350 - 4.500 uIU/mL    Comment: Performed by a 3rd Generation assay with a functional sensitivity of <=0.01 uIU/mL. Performed at [facility], [address] [place], [place] [address]     Blood Alcohol level:  Lab Results  Component Value Date   ETH 89 (H) 07/05/2019   ETH <10 123456    Metabolic Disorder Labs: Lab Results  Component Value Date   HGBA1C 5.0 01/21/2019   MPG 97 01/21/2019   MPG 85.32  06/27/2017   Lab Results  Component Value Date   PROLACTIN 26.8 (H) 08/16/2015   Lab Results  Component Value Date   CHOL 157 06/27/2017   TRIG 242 (H) 06/27/2017   HDL 55 06/27/2017  CHOLHDL 2.9 06/27/2017   VLDL 48 (H) 06/27/2017   LDLCALC 54 06/27/2017    Physical Findings: AIMS:  , ,  ,  ,    CIWA:    COWS:     Musculoskeletal: Strength & Muscle Tone: within normal limits Gait & Station: normal Patient leans: N/A  Psychiatric Specialty Exam: Physical Exam  Nursing note and vitals reviewed. Constitutional: She appears well-developed and well-nourished.  HENT:  Head: Normocephalic and atraumatic.  Eyes: Pupils are equal, round, and reactive to light. Conjunctivae are normal.  Cardiovascular: Regular rhythm and normal heart sounds.  Respiratory: Effort normal.  GI: Soft.  Musculoskeletal:        General: Normal range of motion.     Cervical back: Normal range of motion.  Neurological: She is alert.  Skin: Skin is warm and dry.  Psychiatric: Her affect is blunt. Her speech is delayed. She is slowed. Thought content is not paranoid. She expresses impulsivity. She exhibits a depressed mood. She expresses no homicidal and no suicidal ideation. She exhibits abnormal recent memory.    Review of Systems  Constitutional: Negative.   HENT: Negative.   Eyes: Negative.   Respiratory: Negative.   Cardiovascular: Negative.   Gastrointestinal: Negative.   Musculoskeletal: Negative.   Skin: Negative.   Neurological: Negative.   Psychiatric/Behavioral: Positive for confusion, decreased concentration and dysphoric mood. The patient is nervous/anxious.     Blood pressure 108/75, pulse (!) 103, temperature 98 F (36.7 C), temperature source Oral, resp. rate 17, height 5\' 5"  (1.651 m), weight 68 kg, SpO2 96 %.Body mass index is 24.94 kg/m.  General Appearance: Disheveled  Eye Contact:  Minimal  Speech:  Slow  Volume:  Decreased  Mood:  Dysphoric  Affect:  Congruent   Thought Process:  Goal Directed  Orientation:  Full (Time, Place, and Person)  Thought Content:  Rumination and Tangential  Suicidal Thoughts:  No  Homicidal Thoughts:  No  Memory:  Immediate;   Fair Recent;   Poor Remote;   Poor  Judgement:  Impaired  Insight:  Shallow  Psychomotor Activity:  Decreased  Concentration:  Concentration: Fair  Recall:  [employer] of Knowledge:  Fair  Language:  Fair  Akathisia:  No  Handed:  Right  AIMS (if indicated):     Assets:  Desire for Improvement Physical Health  ADL's:  Impaired  Cognition:  Impaired,  Mild  Sleep:  Number of Hours: 8.25     Treatment Plan Summary: Daily contact with patient to assess and evaluate symptoms and progress in treatment, Medication management and Plan Supportive counseling.  Allowed her to ventilate.  Offered support and encouragement.  We both agreed that she needed more rest and more time to reset and recuperate.  No clear indication to change any medicine right now or to restart anything.  I encouraged her to attend groups.  She uses the excuse that she does not like to be around other people but I have seen her able to converse with others without fully panicking.  Hopefully we will get her more active before making discharge plan  Alethia Berthold, MD 07/07/2019, 11:48 AM

## 2019-07-07 NOTE — [facility] Group Notes (Signed)
Feelings Around Relapse 07/07/2019 9:30AM/1PM  Type of Therapy and Topic:  Group Therapy:  Feelings around Relapse and Recovery  Participation Level:  Did Not Attend   Description of Group:    Patients in this group will discuss emotions they experience before and after a relapse. They will process how experiencing these feelings, or avoidance of experiencing them, relates to having a relapse. Facilitator will guide patients to explore emotions they have related to recovery. Patients will be encouraged to process which emotions are more powerful. They will be guided to discuss the emotional reaction significant others in their lives may have to patients' relapse or recovery. Patients will be assisted in exploring ways to respond to the emotions of others without this contributing to a relapse.  Therapeutic Goals: 1. Patient will identify two or more emotions that lead to a relapse for them 2. Patient will identify two emotions that result when they relapse 3. Patient will identify two emotions related to recovery 4. Patient will demonstrate ability to communicate their needs through discussion and/or role plays   Summary of Patient Progress:     Therapeutic Modalities:   Cognitive Behavioral Therapy Solution-Focused Therapy Assertiveness Training Relapse Prevention Therapy   Yvette Rack, LCSW 07/07/2019 3:37 PM

## 2019-07-07 NOTE — Progress Notes (Signed)
Recreation Therapy Notes  Date: 07/07/2019  Time: 9:30 am   Location: Room 21   Behavioral response: N/A   Intervention Topic: Animal Assisted therapy    Discussion/Intervention: Patient did not attend group.   Clinical Observations/Feedback:  Patient did not attend group.   Terasa Orsini LRT/CTRS         Heather King 07/07/2019 11:57 AM

## 2019-07-08 MED ORDER — LEVOTHYROXINE SODIUM 100 MCG PO TABS: 100.0000 ug | ORAL_TABLET | Freq: Every day | ORAL | 1 refills | Status: AC

## 2019-07-08 MED ORDER — QUETIAPINE FUMARATE 300 MG PO TABS: 300.0000 mg | ORAL_TABLET | Freq: Every day | ORAL | 1 refills | Status: AC

## 2019-07-08 MED ORDER — LORATADINE 10 MG PO TABS: 10.0000 mg | ORAL_TABLET | Freq: Every day | ORAL | 1 refills | Status: AC

## 2019-07-08 MED ORDER — VENLAFAXINE HCL ER 75 MG PO CP24: 75.0000 mg | ORAL_CAPSULE | Freq: Every day | ORAL | 1 refills | Status: AC

## 2019-07-08 MED ORDER — VENLAFAXINE HCL ER 150 MG PO CP24: 300.0000 mg | ORAL_CAPSULE | Freq: Every day | ORAL | 1 refills | Status: AC

## 2019-07-08 MED ORDER — ALBUTEROL SULFATE HFA 108 (90 BASE) MCG/ACT IN AERS: 2.0000 | INHALATION_SPRAY | Freq: Four times a day (QID) | RESPIRATORY_TRACT | 0 refills | Status: AC | PRN

## 2019-07-08 MED ORDER — CLONAZEPAM 0.5 MG PO TABS: 0.5000 mg | ORAL_TABLET | Freq: Four times a day (QID) | ORAL | 1 refills | Status: AC

## 2019-07-08 MED ORDER — ARMOUR THYROID 60 MG PO TABS: 60.0000 mg | ORAL_TABLET | Freq: Every day | ORAL | 1 refills | Status: AC

## 2019-07-08 MED ORDER — QUETIAPINE FUMARATE 100 MG PO TABS
300.0000 mg | ORAL_TABLET | Freq: Every day | ORAL | Status: DC
  Administered 2019-07-08: 300 mg via ORAL
  Filled 2019-07-08: qty 3

## 2019-07-08 MED ORDER — PANTOPRAZOLE SODIUM 40 MG PO TBEC: 40.0000 mg | DELAYED_RELEASE_TABLET | Freq: Every day | ORAL | 1 refills | Status: AC

## 2019-07-08 MED ORDER — BUTALBITAL-APAP-CAFFEINE 50-325-40 MG PO TABS
2.0000 | ORAL_TABLET | ORAL | Status: AC
  Administered 2019-07-08: 2 via ORAL
  Filled 2019-07-08: qty 2

## 2019-07-08 NOTE — [facility] Group Notes (Signed)
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
## 2019-07-08 NOTE — Plan of Care (Signed)
Patient denies SI / HI /AVH. Patient is calm and cooperative. Patient is focused on "night meds" and request PRN for sleep. Patient has no complaints.    Problem: Education: Goal: Knowledge of [facility] Education information/materials will improve Outcome: Progressing Goal: Emotional status will improve Outcome: Progressing Goal: Mental status will improve Outcome: Progressing

## 2019-07-08 NOTE — BHH Suicide Risk Assessment (Signed)
[facility] Discharge Suicide Risk Assessment   Principal Problem: MDD (major depressive disorder), recurrent episode, severe ([place]) Discharge Diagnoses: Principal Problem:   MDD (major depressive disorder), recurrent episode, severe ([place]) Active Problems:   Anxiety disorder   Chronic headaches   GERD (gastroesophageal reflux disease)   Hypothyroidism   Alcohol abuse   Total Time spent with patient: 30 minutes  Musculoskeletal: Strength & Muscle Tone: within normal limits Gait & Station: normal Patient leans: N/A  Psychiatric Specialty Exam: Review of Systems  Constitutional: Negative.   HENT: Negative.   Eyes: Negative.   Respiratory: Negative.   Cardiovascular: Negative.   Gastrointestinal: Negative.   Musculoskeletal: Negative.   Skin: Negative.   Neurological: Negative.   Psychiatric/Behavioral: Negative for self-injury, sleep disturbance and suicidal ideas. The patient is nervous/anxious.     Blood pressure 107/68, pulse (!) 105, temperature 97.9 F (36.6 C), temperature source Oral, resp. rate 17, height 5\' 5"  (1.651 m), weight 68 kg, SpO2 98 %.Body mass index is 24.94 kg/m.  General Appearance: Casual  Eye Contact::  Fair  Speech:  Clear and [id number]  Volume:  Normal  Mood:  Euthymic  Affect:  Congruent  Thought Process:  Goal Directed  Orientation:  Full (Time, Place, and Person)  Thought Content:  Logical  Suicidal Thoughts:  No  Homicidal Thoughts:  No  Memory:  Immediate;   Fair Recent;   Fair Remote;   Fair  Judgement:  Fair  Insight:  Fair  Psychomotor Activity:  Normal  Concentration:  Fair  Recall:  [employer] of [place]  Language: Fair  Akathisia:  No  Handed:  Right  AIMS (if indicated):     Assets:  Agricultural consultant Housing Resilience Social Support  Sleep:  Number of Hours: 7.45  Cognition: WNL  ADL's:  Intact   Mental Status Per Nursing Assessment::   On Admission:  NA  Demographic Factors:   Caucasian  Loss Factors: Decline in physical health  Historical Factors: Impulsivity  Risk Reduction Factors:   Sense of responsibility to family, Religious beliefs about death, Living with another person, especially a relative, Positive social support and Positive coping skills or problem solving skills  Continued Clinical Symptoms:  Bipolar Disorder:   Mixed State  Cognitive Features That Contribute To Risk:  Loss of executive function    Suicide Risk:  Minimal: No identifiable suicidal ideation.  Patients presenting with no risk factors but with morbid ruminations; may be classified as minimal risk based on the severity of the depressive symptoms  Follow-up Information    [facility].   Why: Appointment is scheudled for 07/25/2019 at [facility].  Please give a 24 hr notice if appointment needs to be cancelled, if you no show appointment you won't be rescheduled at any of their locations & if you cancel 2 appts the agency has deiscretion to end case Contact information: [place] [address] [place] recommendations:  Activity:  Activity as tolerated Diet:  Regular diet Other:  Follow-up with outpatient mental health treatment  Alethia Berthold, MD 07/08/2019, 4:22 PM

## 2019-07-08 NOTE — Progress Notes (Signed)
[facility] MD Progress Note  07/08/2019 4:19 PM Heather King  MRN:  [id number] Subjective: Follow-up for this patient with complex longstanding mood disorder.  Patient feeling much better today.  Slept adequately last night.  Had a headache this afternoon but it got better with 2 Fioricet.  Patient is denying any suicidal or homicidal ideation.  Requesting discharge tomorrow.  Her behavior has been calm and lucid.  She is agreeable to outpatient follow-up and has a reasonable plan in place. Principal Problem: MDD (major depressive disorder), recurrent episode, severe ([place]) Diagnosis: Principal Problem:   MDD (major depressive disorder), recurrent episode, severe (HCC) Active Problems:   Anxiety disorder   Chronic headaches   GERD (gastroesophageal reflux disease)   Hypothyroidism   Alcohol abuse  Total Time spent with patient: 30 minutes  Past Psychiatric History: Past history of recurrent episodes of anxiety and depression  Past Medical History:  Past Medical History:  Diagnosis Date  . ADD (attention deficit disorder)   . Anxiety   . Bipolar disorder ([place])   . Cancer (HCC)    Cervical Cancer  . Depression   . GERD (gastroesophageal reflux disease)   . Huntington's disease ([place])   . Insomnia due to medical condition     Past Surgical History:  Procedure Laterality Date  . ABDOMINAL HYSTERECTOMY  2013  . CHOLECYSTECTOMY  2012  . LEEP  2007   Family History:  Family History  Problem Relation Age of Onset  . Hypertension Mother   . Diabetes Mother   . Mental illness Mother   . Depression Mother   . Cancer Father        lung  . Alcohol abuse Father   . Dementia Maternal Grandmother   . Cancer Maternal Grandmother        breast  . COPD Maternal Grandmother   . Depression Maternal Grandmother   . Dementia Maternal Grandfather   . Cancer Maternal Grandfather        Lung  . Diabetes Brother   . ADD / ADHD Brother   . Breast cancer Maternal Aunt 90   Family  Psychiatric  History: See previous Social History:  Social History   Substance and Sexual Activity  Alcohol Use Yes   Comment: occasional     Social History   Substance and Sexual Activity  Drug Use No    Social History   Socioeconomic History  . Marital status: Married    Spouse name: Not on file  . Number of children: 1  . Years of education: Not on file  . Highest education level: Some college, no degree  Occupational History  . Not on file  Tobacco Use  . Smoking status: Never Smoker  . Smokeless tobacco: Never Used  Substance and Sexual Activity  . Alcohol use: Yes    Comment: occasional  . Drug use: No  . Sexual activity: Not on file  Other Topics Concern  . Not on file  Social History Narrative  . Not on file   Social Determinants of Health   Financial Resource Strain:   . Difficulty of Paying Living Expenses:   Food Insecurity:   . Worried About Charity fundraiser in the Last Year:   . Arboriculturist in the Last Year:   Transportation Needs:   . Film/video editor (Medical):   Marland Kitchen Lack of Transportation (Non-Medical):   Physical Activity:   . Days of Exercise per Week:   . Minutes of Exercise  per Session:   Stress:   . Feeling of Stress :   Social Connections:   . Frequency of Communication with Friends and Family:   . Frequency of Social Gatherings with Friends and Family:   . Attends Religious Services:   . Active Member of Clubs or Organizations:   . Attends Archivist Meetings:   Marland Kitchen Marital Status:    Additional Social History:                         Sleep: Fair  Appetite:  Fair  Current Medications: Current Facility-Administered Medications  Medication Dose Route Frequency Provider Last Rate Last Admin  . acetaminophen (TYLENOL) tablet 650 mg  650 mg Oral Q6H PRN Caroline Sauger, NP   650 mg at 07/08/19 0818  . albuterol (VENTOLIN HFA) 108 (90 Base) MCG/ACT inhaler 2 puff  2 puff Inhalation Q6H PRN  Caroline Sauger, NP      . alum & mag hydroxide-simeth (MAALOX/MYLANTA) 200-200-20 MG/5ML suspension 30 mL  30 mL Oral Q4H PRN Caroline Sauger, NP      . clonazePAM Bobbye [place]) tablet 0.5 mg  0.5 mg Oral QID Caroline Sauger, NP   0.5 mg at 07/08/19 1153  . levothyroxine (SYNTHROID) tablet 100 mcg  100 mcg Oral Q0600 Markeria Goetsch T, MD      . loratadine (CLARITIN) tablet 10 mg  10 mg Oral Daily Caroline Sauger, NP   10 mg at 07/08/19 0815  . magnesium hydroxide (MILK OF MAGNESIA) suspension 30 mL  30 mL Oral Daily PRN Caroline Sauger, NP      . pantoprazole (PROTONIX) EC tablet 40 mg  40 mg Oral BID Caroline Sauger, NP   40 mg at 07/08/19 0814  . QUEtiapine (SEROQUEL) tablet 300 mg  300 mg Oral QHS Kylian Loh T, MD      . thyroid (ARMOUR) tablet 60 mg  60 mg Oral Daily Caroline Sauger, NP   60 mg at 07/08/19 0817  . venlafaxine XR (EFFEXOR-XR) 24 hr capsule 300 mg  300 mg Oral Q breakfast Caroline Sauger, NP   300 mg at 07/08/19 0814  . venlafaxine XR (EFFEXOR-XR) 24 hr capsule 75 mg  75 mg Oral Q breakfast Caroline Sauger, NP   75 mg at 07/08/19 [id number]    Lab Results: No results found for this or any previous visit (from the past 48 hour(s)).  Blood Alcohol level:  Lab Results  Component Value Date   ETH 89 (H) 07/05/2019   ETH <10 123456    Metabolic Disorder Labs: Lab Results  Component Value Date   HGBA1C 5.0 01/21/2019   MPG 97 01/21/2019   MPG 85.32 06/27/2017   Lab Results  Component Value Date   PROLACTIN 26.8 (H) 08/16/2015   Lab Results  Component Value Date   CHOL 157 06/27/2017   TRIG 242 (H) 06/27/2017   HDL 55 06/27/2017   CHOLHDL 2.9 06/27/2017   VLDL 48 (H) 06/27/2017   LDLCALC 54 06/27/2017    Physical Findings: AIMS:  , ,  ,  ,    CIWA:    COWS:     Musculoskeletal: Strength & Muscle Tone: within normal limits Gait & Station: normal Patient leans: N/A  Psychiatric Specialty Exam: Physical Exam   Nursing note and vitals reviewed. Constitutional: She appears well-developed and well-nourished.  HENT:  Head: Normocephalic and atraumatic.  Eyes: Pupils are equal, round, and reactive to light. Conjunctivae are normal.  Cardiovascular: Regular rhythm  and normal heart sounds.  Respiratory: Effort normal.  GI: Soft.  Musculoskeletal:        General: Normal range of motion.     Cervical back: Normal range of motion.  Neurological: She is alert.  Skin: Skin is warm and dry.  Psychiatric: Her speech is normal and behavior is normal. Judgment and thought content normal. Her mood appears anxious. Cognition and memory are normal.    Review of Systems  Constitutional: Negative.   HENT: Negative.   Eyes: Negative.   Respiratory: Negative.   Cardiovascular: Negative.   Gastrointestinal: Negative.   Musculoskeletal: Negative.   Skin: Negative.   Neurological: Negative.   Psychiatric/Behavioral: Negative for self-injury, sleep disturbance and suicidal ideas. The patient is nervous/anxious.     Blood pressure 107/68, pulse (!) 105, temperature 97.9 F (36.6 C), temperature source Oral, resp. rate 17, height 5\' 5"  (1.651 m), weight 68 kg, SpO2 98 %.Body mass index is 24.94 kg/m.  General Appearance: Casual  Eye Contact:  Good  Speech:  Clear and Coherent  Volume:  Normal  Mood:  Euthymic  Affect:  Congruent  Thought Process:  Goal Directed  Orientation:  Full (Time, Place, and Person)  Thought Content:  Logical  Suicidal Thoughts:  No  Homicidal Thoughts:  No  Memory:  Immediate;   Fair Recent;   Fair Remote;   Fair  Judgement:  Fair  Insight:  Fair  Psychomotor Activity:  Normal  Concentration:  Concentration: Fair  Recall:  [employer] of Knowledge:  Fair  Language:  Fair  Akathisia:  No  Handed:  Right  AIMS (if indicated):     Assets:  Desire for Improvement Housing Resilience Social Support  ADL's:  Intact  Cognition:  WNL  Sleep:  Number of Hours: 7.45      Treatment Plan Summary: Daily contact with patient to assess and evaluate symptoms and progress in treatment, Medication management and Plan At her request we are cutting the Seroquel dose down by a little bit.  I told her I did not want to increase her clonazepam.  Discontinue prednisone as no longer of any benefit.  Not going to return to frequent Fioricet.  I agreed with her that we can plan on discharge tomorrow she says she will follow-up with [place] [facility]  Alethia Berthold, MD 07/08/2019, 4:19 PM

## 2019-07-08 NOTE — Progress Notes (Signed)
Recreation Therapy Notes  Date: 07/08/2019  Time: 9:30 am   Location: Craft room   Behavioral response: N/A   Intervention Topic: Happiness   Discussion/Intervention: Patient did not attend group.   Clinical Observations/Feedback:  Patient did not attend group.   Areyanna Figeroa LRT/CTRS        Mima Cranmore 07/08/2019 12:14 PM

## 2019-07-08 NOTE — Plan of Care (Signed)
D: Pt alert and oriented x 4. Pt rates depression 6/10, hopelessness 5/10, and anxiety 7/10.Pt goal: "anxiety." Pt reports energy leave as low and concentration as being poor. Pt reports sleep last night as being fair. Pt did receive medications for sleep and did find them helpful. Pt reports experiencing a headache rated 8/10, prn medication even and found helpful. Pt denies experiencing any SI/HI, or AVH at this time.   A: Scheduled medications administered to pt, per MD orders. Support and encouragement provided. Frequent verbal contact made. Routine safety checks conducted q15 minutes.   R: No adverse drug reactions noted. Pt verbally contracts for safety at this time. Pt complaint with medications and treatment plan. Pt interacts well with others on the unit. Pt remains safe at this time. Will continue to monitor.   Problem: Coping: Goal: Ability to verbalize frustrations and anger appropriately will improve Outcome: Progressing   Problem: Activity: Goal: Interest or engagement in leisure activities will improve Outcome: Progressing   Problem: Coping: Goal: Will verbalize feelings Outcome: Progressing

## 2019-07-08 NOTE — BHH Group Notes (Signed)
[place] Group Notes:  (Nursing/MHT/Case Management/Adjunct)  Date:  07/08/2019  Time:  10:40 PM  Type of Therapy:  Group Therapy  Participation Level:  Active  Participation Quality:  Appropriate  Affect:  Appropriate  Cognitive:  Alert  Insight:  Good  Engagement in Group:  Engaged and talk about being discharge.  Modes of Intervention:  Support  Summary of Progress/Problems:  Heather King 07/08/2019, 10:40 PM

## 2019-07-09 NOTE — Progress Notes (Signed)
Discharge Note:   Pt discharged today at this time. Pt left with staff/MHT to meet her adult daughter picking her up, her ride home. Upon discharge, pt denies suicidal and homicidal ideation, denies hallucinations, denies feelings of depression or anxiety, no distress noted, none reported, voices no complaints, is calm, cooperative, pleasant, affect appropriately bright. Pt given discharge instructions, which include pt's mental health follow up appointments, discharge medication prescriptions, discharge medication education; pt verbalized understanding of all. All personal belongings returned to pt upon discharge.

## 2019-07-09 NOTE — BHH Suicide Risk Assessment (Signed)
[facility] Discharge Suicide Risk Assessment   Principal Problem: MDD (major depressive disorder), recurrent episode, severe ([place]) Discharge Diagnoses: Principal Problem:   MDD (major depressive disorder), recurrent episode, severe ([place]) Active Problems:   Anxiety disorder   Chronic headaches   GERD (gastroesophageal reflux disease)   Hypothyroidism   Alcohol abuse   Total Time spent with patient: 20 minutes  Musculoskeletal: Strength & Muscle Tone: within normal limits Gait & Station: normal Patient leans: N/A  Psychiatric Specialty Exam: Review of Systems  All other systems reviewed and are negative.   Blood pressure 112/68, pulse 97, temperature 98.3 F (36.8 C), temperature source Oral, resp. rate 18, height 5\' 5"  (1.651 m), weight 68 kg, SpO2 100 %.Body mass index is 24.94 kg/m.  General Appearance: Casual  Eye Contact::  Good  Speech:  Normal Rate409  Volume:  Increased  Mood:  Anxious  Affect:  Congruent  Thought Process:  Coherent and Descriptions of Associations: Intact  Orientation:  Full (Time, Place, and Person)  Thought Content:  Logical  Suicidal Thoughts:  No  Homicidal Thoughts:  No  Memory:  Immediate;   Fair Recent;   Fair Remote;   Fair  Judgement:  Intact  Insight:  Fair  Psychomotor Activity:  Increased  Concentration:  Good  Recall:  [employer] of Knowledge:Fair  Language: Fair  Akathisia:  Negative  Handed:  Right  AIMS (if indicated):     Assets:  Desire for Improvement Housing Resilience Social Support  Sleep:  Number of Hours: 7  Cognition: WNL  ADL's:  Intact   Mental Status Per Nursing Assessment::   On Admission:  NA  Demographic Factors:  Caucasian and Unemployed  Loss Factors: NA  Historical Factors: Impulsivity  Risk Reduction Factors:   Responsible for children under 2 years of age, Sense of responsibility to family, Living with another person, especially a relative and Positive social support  Continued Clinical  Symptoms:  Bipolar Disorder:   Mixed State  Cognitive Features That Contribute To Risk:  None    Suicide Risk:  Minimal: No identifiable suicidal ideation.  Patients presenting with no risk factors but with morbid ruminations; may be classified as minimal risk based on the severity of the depressive symptoms  Follow-up Information    [facility] [place] [facility]   Why: Appointment is scheudled for 07/25/2019 at [place].  Please give a 24 hr notice if appointment needs to be cancelled, if you no show appointment you won't be rescheduled at any of their locations & if you cancel 2 appts the agency has deiscretion to end case Contact information: [place] [address] [place] recommendations:  Activity:  ad lib  Sharma Covert, MD 07/09/2019, 11:30 AM

## 2019-07-09 NOTE — Progress Notes (Signed)
  [facility] Adult Case Management Discharge Plan :  Will you be returning to the same living situation after discharge:  Yes patient is returning to her home on [address] in [place] At discharge, do you have transportation home?: Yes,  transportation is being provided by patient's husband Iniki Bough. Do you have the ability to pay for your medications: Yes,  patient has means to get medication.  Release of information consent forms completed and in the chart;  Patient's signature needed at discharge.  Patient to Follow up at: Follow-up Information    [facility]   Why: Appointment is scheudled for 07/25/2019 at [place].  Please give a 24 hr notice if appointment needs to be cancelled, if you no show appointment you won't be rescheduled at any of their locations & if you cancel 2 appts the agency has deiscretion to end case Contact information: [place] [address] [phone]           Next level of care provider has access to [place] and Suicide Prevention discussed: No. patient declined identifying an individual to receive SPE.     Has patient been referred to the Quitline?: N/A patient is not a smoker  Patient has been referred for addiction treatment: Gloria [place], LCSW 07/09/2019, 10:50 AM

## 2019-07-18 ENCOUNTER — Other Ambulatory Visit: Payer: Self-pay | Admitting: Psychiatry

## 2019-07-25 ENCOUNTER — Other Ambulatory Visit: Payer: Self-pay | Admitting: Psychiatry

## 2019-07-28 DIAGNOSIS — F341 Dysthymic disorder: Secondary | ICD-10-CM | POA: Diagnosis not present

## 2019-07-28 DIAGNOSIS — E039 Hypothyroidism, unspecified: Secondary | ICD-10-CM | POA: Diagnosis not present

## 2019-07-28 DIAGNOSIS — F4322 Adjustment disorder with anxiety: Secondary | ICD-10-CM | POA: Diagnosis not present

## 2019-07-28 DIAGNOSIS — F909 Attention-deficit hyperactivity disorder, unspecified type: Secondary | ICD-10-CM | POA: Diagnosis not present

## 2019-07-28 DIAGNOSIS — E063 Autoimmune thyroiditis: Secondary | ICD-10-CM | POA: Diagnosis not present

## 2019-07-28 DIAGNOSIS — E6609 Other obesity due to excess calories: Secondary | ICD-10-CM | POA: Diagnosis not present

## 2019-07-28 DIAGNOSIS — G10 Huntington's disease: Secondary | ICD-10-CM | POA: Diagnosis not present

## 2019-07-28 DIAGNOSIS — E049 Nontoxic goiter, unspecified: Secondary | ICD-10-CM | POA: Diagnosis not present

## 2019-07-28 DIAGNOSIS — E781 Pure hyperglyceridemia: Secondary | ICD-10-CM | POA: Diagnosis not present

## 2019-08-01 ENCOUNTER — Other Ambulatory Visit: Payer: Self-pay | Admitting: Psychiatry

## 2019-08-05 DIAGNOSIS — G10 Huntington's disease: Secondary | ICD-10-CM | POA: Diagnosis not present

## 2019-08-05 DIAGNOSIS — R278 Other lack of coordination: Secondary | ICD-10-CM | POA: Diagnosis not present

## 2019-08-05 DIAGNOSIS — G43709 Chronic migraine without aura, not intractable, without status migrainosus: Secondary | ICD-10-CM | POA: Diagnosis not present

## 2019-08-05 DIAGNOSIS — Z5321 Procedure and treatment not carried out due to patient leaving prior to being seen by health care provider: Secondary | ICD-10-CM | POA: Diagnosis not present

## 2019-08-05 DIAGNOSIS — F909 Attention-deficit hyperactivity disorder, unspecified type: Secondary | ICD-10-CM | POA: Diagnosis not present

## 2019-08-05 DIAGNOSIS — F4001 Agoraphobia with panic disorder: Secondary | ICD-10-CM | POA: Diagnosis not present

## 2019-08-05 DIAGNOSIS — Z820 Family history of epilepsy and other diseases of the nervous system: Secondary | ICD-10-CM | POA: Diagnosis not present

## 2019-08-05 DIAGNOSIS — F064 Anxiety disorder due to known physiological condition: Secondary | ICD-10-CM | POA: Diagnosis not present

## 2019-08-10 DIAGNOSIS — E063 Autoimmune thyroiditis: Secondary | ICD-10-CM | POA: Diagnosis not present

## 2019-08-10 DIAGNOSIS — E6609 Other obesity due to excess calories: Secondary | ICD-10-CM | POA: Diagnosis not present

## 2019-08-10 DIAGNOSIS — E781 Pure hyperglyceridemia: Secondary | ICD-10-CM | POA: Diagnosis not present

## 2019-08-10 DIAGNOSIS — F4322 Adjustment disorder with anxiety: Secondary | ICD-10-CM | POA: Diagnosis not present

## 2019-08-10 DIAGNOSIS — E039 Hypothyroidism, unspecified: Secondary | ICD-10-CM | POA: Diagnosis not present

## 2019-08-10 DIAGNOSIS — F909 Attention-deficit hyperactivity disorder, unspecified type: Secondary | ICD-10-CM | POA: Diagnosis not present

## 2019-08-10 DIAGNOSIS — F341 Dysthymic disorder: Secondary | ICD-10-CM | POA: Diagnosis not present

## 2019-08-10 DIAGNOSIS — Z6830 Body mass index (BMI) 30.0-30.9, adult: Secondary | ICD-10-CM | POA: Diagnosis not present

## 2019-08-10 DIAGNOSIS — E049 Nontoxic goiter, unspecified: Secondary | ICD-10-CM | POA: Diagnosis not present

## 2019-11-18 ENCOUNTER — Other Ambulatory Visit: Payer: Self-pay | Admitting: Internal Medicine

## 2019-12-13 ENCOUNTER — Other Ambulatory Visit: Payer: Self-pay | Admitting: Internal Medicine

## 2019-12-23 ENCOUNTER — Encounter: Payer: Self-pay | Admitting: Internal Medicine

## 2019-12-24 ENCOUNTER — Encounter: Payer: Self-pay | Admitting: Family Medicine

## 2019-12-24 ENCOUNTER — Telehealth (INDEPENDENT_AMBULATORY_CARE_PROVIDER_SITE_OTHER): Payer: Medicare HMO | Admitting: Family Medicine

## 2019-12-24 DIAGNOSIS — N760 Acute vaginitis: Secondary | ICD-10-CM

## 2019-12-24 NOTE — Progress Notes (Signed)
Pt did not show for appt

## 2019-12-25 NOTE — Telephone Encounter (Signed)
It looks like she was not seen at Saturday clinic . Do we need to set up a virtual for Monday?

## 2019-12-25 NOTE — Telephone Encounter (Signed)
I had not been involved in her care for the situation.  I just saw her my chart message.  Routed to PCP as FYI.

## 2019-12-26 DIAGNOSIS — N760 Acute vaginitis: Secondary | ICD-10-CM | POA: Insufficient documentation

## 2020-03-19 ENCOUNTER — Other Ambulatory Visit: Payer: Self-pay | Admitting: Endocrinology

## 2020-03-19 ENCOUNTER — Other Ambulatory Visit: Payer: Self-pay | Admitting: Internal Medicine

## 2020-03-19 DIAGNOSIS — Z1231 Encounter for screening mammogram for malignant neoplasm of breast: Secondary | ICD-10-CM

## 2020-03-27 ENCOUNTER — Other Ambulatory Visit: Payer: Self-pay

## 2020-03-27 ENCOUNTER — Ambulatory Visit
Admission: RE | Admit: 2020-03-27 | Discharge: 2020-03-27 | Disposition: A | Discharge status: Home / Self Care | Payer: Medicare HMO | Source: Ambulatory Visit | Attending: Endocrinology | Admitting: Endocrinology

## 2020-03-27 DIAGNOSIS — Z1231 Encounter for screening mammogram for malignant neoplasm of breast: Secondary | ICD-10-CM | POA: Insufficient documentation

## 2020-06-08 ENCOUNTER — Other Ambulatory Visit: Payer: Self-pay | Admitting: Psychiatry

## 2022-06-16 ENCOUNTER — Encounter (HOSPITAL_COMMUNITY): Payer: Self-pay

## 2022-11-11 ENCOUNTER — Telehealth (INDEPENDENT_AMBULATORY_CARE_PROVIDER_SITE_OTHER): Payer: Self-pay

## 2022-11-11 NOTE — Telephone Encounter (Signed)
Spoke with the patient and explained that regarding her port placement we are waiting on her doctor to send in an order for the port to be placed. We will then make an office appt, she will be seen and the procedure scheduled. Patient was given the fax number and advised on the flow of the appt. Patient stated she understood.

## 2022-12-02 ENCOUNTER — Encounter: Payer: Self-pay | Admitting: Family Medicine

## 2022-12-11 ENCOUNTER — Other Ambulatory Visit: Payer: Self-pay | Admitting: Internal Medicine

## 2022-12-11 DIAGNOSIS — Z1231 Encounter for screening mammogram for malignant neoplasm of breast: Secondary | ICD-10-CM

## 2023-02-26 ENCOUNTER — Inpatient Hospital Stay: Payer: Medicare HMO

## 2023-02-26 ENCOUNTER — Inpatient Hospital Stay: Payer: Medicare HMO | Attending: Oncology | Admitting: Oncology

## 2023-03-09 ENCOUNTER — Inpatient Hospital Stay: Payer: Medicare HMO | Admitting: Oncology

## 2023-03-09 ENCOUNTER — Inpatient Hospital Stay: Payer: Medicare HMO

## 2023-03-10 ENCOUNTER — Inpatient Hospital Stay: Payer: Medicare HMO | Admitting: Oncology

## 2023-03-10 ENCOUNTER — Inpatient Hospital Stay: Payer: Medicare HMO

## 2023-03-13 ENCOUNTER — Telehealth: Payer: Self-pay | Admitting: Internal Medicine

## 2023-03-13 NOTE — Telephone Encounter (Signed)
Spoke with patient to confirm her appointment with Dr. Alena Bills on 2/6 at 2pm.

## 2023-03-17 ENCOUNTER — Inpatient Hospital Stay: Payer: Medicare HMO | Admitting: Oncology

## 2023-03-17 ENCOUNTER — Inpatient Hospital Stay: Payer: Medicare HMO

## 2023-03-19 ENCOUNTER — Inpatient Hospital Stay: Payer: Medicare HMO

## 2023-03-19 ENCOUNTER — Encounter: Payer: Self-pay | Admitting: Internal Medicine

## 2023-03-19 ENCOUNTER — Inpatient Hospital Stay: Payer: Medicare HMO | Attending: Internal Medicine | Admitting: Internal Medicine

## 2023-03-19 VITALS — BP 97/61 | HR 78 | Temp 97.9°F | Resp 18 | Wt 127.0 lb

## 2023-03-19 DIAGNOSIS — Z862 Personal history of diseases of the blood and blood-forming organs and certain disorders involving the immune mechanism: Secondary | ICD-10-CM | POA: Insufficient documentation

## 2023-03-19 DIAGNOSIS — F418 Other specified anxiety disorders: Secondary | ICD-10-CM | POA: Insufficient documentation

## 2023-03-19 DIAGNOSIS — R634 Abnormal weight loss: Secondary | ICD-10-CM | POA: Insufficient documentation

## 2023-03-19 DIAGNOSIS — R112 Nausea with vomiting, unspecified: Secondary | ICD-10-CM | POA: Insufficient documentation

## 2023-03-19 DIAGNOSIS — Z803 Family history of malignant neoplasm of breast: Secondary | ICD-10-CM | POA: Diagnosis not present

## 2023-03-19 DIAGNOSIS — D75839 Thrombocytosis, unspecified: Secondary | ICD-10-CM | POA: Insufficient documentation

## 2023-03-19 DIAGNOSIS — Z1231 Encounter for screening mammogram for malignant neoplasm of breast: Secondary | ICD-10-CM | POA: Insufficient documentation

## 2023-03-19 DIAGNOSIS — Z801 Family history of malignant neoplasm of trachea, bronchus and lung: Secondary | ICD-10-CM | POA: Diagnosis not present

## 2023-03-19 DIAGNOSIS — D72829 Elevated white blood cell count, unspecified: Secondary | ICD-10-CM

## 2023-03-19 DIAGNOSIS — Z8541 Personal history of malignant neoplasm of cervix uteri: Secondary | ICD-10-CM | POA: Insufficient documentation

## 2023-03-19 DIAGNOSIS — G10 Huntington's disease: Secondary | ICD-10-CM | POA: Diagnosis not present

## 2023-03-19 DIAGNOSIS — Z8505 Personal history of malignant neoplasm of liver: Secondary | ICD-10-CM | POA: Diagnosis not present

## 2023-03-19 DIAGNOSIS — Z79899 Other long term (current) drug therapy: Secondary | ICD-10-CM | POA: Diagnosis not present

## 2023-03-19 LAB — CMP (CANCER CENTER ONLY)
ALT: 23 U/L (ref 0–44)
AST: 19 U/L (ref 15–41)
Albumin: 4.2 g/dL (ref 3.5–5.0)
Alkaline Phosphatase: 90 U/L (ref 38–126)
Anion gap: 9 (ref 5–15)
BUN: 19 mg/dL (ref 6–20)
CO2: 27 mmol/L (ref 22–32)
Calcium: 9.6 mg/dL (ref 8.9–10.3)
Chloride: 103 mmol/L (ref 98–111)
Creatinine: 0.68 mg/dL (ref 0.44–1.00)
GFR, Estimated: >60 mL/min (ref >60)
Glucose, Bld: 94 mg/dL (ref 70–99)
Potassium: 4 mmol/L (ref 3.5–5.1)
Sodium: 139 mmol/L (ref 135–145)
Total Bilirubin: 0.5 mg/dL (ref 0.0–1.2)
Total Protein: 7.5 g/dL (ref 6.5–8.1)

## 2023-03-19 LAB — CBC WITH DIFFERENTIAL (CANCER CENTER ONLY)
Abs Immature Granulocytes: 0.03 10*3/uL (ref 0.00–0.07)
Basophils Absolute: 0 10*3/uL (ref 0.0–0.1)
Basophils Relative: 0 %
Eosinophils Absolute: 0.2 10*3/uL (ref 0.0–0.5)
Eosinophils Relative: 2 %
HCT: 37.1 % (ref 36.0–46.0)
Hemoglobin: 12.3 g/dL (ref 12.0–15.0)
Immature Granulocytes: 0 %
Lymphocytes Relative: 25 %
Lymphs Abs: 2.5 10*3/uL (ref 0.7–4.0)
MCH: 31.4 pg (ref 26.0–34.0)
MCHC: 33.2 g/dL (ref 30.0–36.0)
MCV: 94.6 fL (ref 80.0–100.0)
Monocytes Absolute: 0.4 10*3/uL (ref 0.1–1.0)
Monocytes Relative: 5 %
Neutro Abs: 6.6 10*3/uL (ref 1.7–7.7)
Neutrophils Relative %: 68 %
Platelet Count: 324 10*3/uL (ref 150–400)
RBC: 3.92 MIL/uL (ref 3.87–5.11)
RDW: 12.4 % (ref 11.5–15.5)
WBC Count: 9.7 10*3/uL (ref 4.0–10.5)
nRBC: 0 % (ref 0.0–0.2)

## 2023-03-19 NOTE — Addendum Note (Signed)
 Addended by: Lonie Roa on: 03/19/2023 02:59 PM   Modules accepted: Orders

## 2023-03-19 NOTE — Progress Notes (Signed)
 [facility]  [phone] [phone]  ID: Heather King OB: [birth year]  MR#: [id number]  RDW#:[id number]  Patient Care Team: Devera Bruckner, MD as Referring Physician (Neurology) Lane Arthea BRAVO, MD as Referring Physician (Neurology) Norris Adelita Pereyra, FNP (Pain Medicine) Nolen Adine SAILOR, MD (Psychiatry) Clista Bimler, MD as Consulting Physician (Oncology) Clista Bimler, MD as Consulting Physician (Oncology)  REFERRING PROVIDER: Jordan Henderson, DO  REASON FOR REFERRAL: leucocytosis/ thrombocytosis  HPI: Heather King is a 43 y.o. female with past medical history of hepatocellular cancer, cervical cancer status post hysterectomy in 2013, Huntington's disease, GERD, depression referred to hematology as a transfer of care for leukocytosis thrombocytosis.  Patient was following with Dr.Dejuania Delores at [facility] in [place] for leukocytosis and thrombocytosis.  Was referred in May 2023.  Of note, flow cytometry was unremarkable.  JAK2 V6 27F/ CALR/ MPL/ exon 12-15, BCR-ABL negative.  Deemed likely reactive process.  Last seen on 02/12/2022 which showed WBC of 7.8, hemoglobin 12.5 and platelets of 349. Follow-up with hematology as needed was advised.  Patient moved back to [place]  where she had been most of her life.  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  She reports weight loss of 80 pounds in 11 months.  Nausea and vomiting after eating almost every single time.  Reports had endoscopy prior but not since the initiation of her symptoms.  Of chart, there is documented history of weight loss since August 2023.  She had a CT of chest abdomen pelvis which did not show acute abnormalities or malignancy.  She also gives me a history of hepatocellular cancer which was treated at a different cancer center close to [facility].  Tells me she had IV treatments and now was told she is in  remission and needs to get imaging.  I do not have any records of that. Reports ongoing fatigue.  Low-grade fever couple of days a week.  She is looking for a new primary care doctor.  Not had screening mammogram for the past 2 years and requesting if she can have that arranged.   REVIEW OF SYSTEMS:   ROS  As per HPI. Otherwise, a complete review of systems is negative.  PAST MEDICAL HISTORY: Past Medical History:  Diagnosis Date   ADD (attention deficit disorder)    Anxiety    Bipolar disorder (HCC)    Cancer (HCC)    Cervical Cancer   Depression    GERD (gastroesophageal reflux disease)    Huntington's disease (HCC)    Insomnia due to medical condition     PAST SURGICAL HISTORY: Past Surgical History:  Procedure Laterality Date   ABDOMINAL HYSTERECTOMY  2013   CHOLECYSTECTOMY  2012   LEEP  2007    FAMILY HISTORY: Family History  Problem Relation Age of Onset   Hypertension Mother    Diabetes Mother    Mental illness Mother    Depression Mother    Cancer Father        lung   Alcohol abuse Father    Dementia Maternal Grandmother    Cancer Maternal Grandmother        breast   COPD Maternal Grandmother    Depression Maternal Grandmother    Dementia Maternal Grandfather    Cancer Maternal Grandfather        Lung   Diabetes Brother    ADD / ADHD Brother    Breast cancer Maternal Aunt 34  HEALTH MAINTENANCE: Social History   Tobacco Use   Smoking status: Never   Smokeless tobacco: Never  Vaping Use   Vaping status: Never Used  Substance Use Topics   Alcohol use: Yes    Comment: occasional   Drug use: No     Allergies  Allergen Reactions   Abilify  [Aripiprazole ] Other (See Comments)    akathisia   Gabapentin Rash   Nifedipine Rash    To cream    Current Outpatient Medications  Medication Sig Dispense Refill   albuterol  (VENTOLIN  HFA) 108 (90 Base) MCG/ACT inhaler Inhale 2 puffs into the lungs every 6 (six) hours as needed for wheezing or  shortness of breath. 8 g 0   amphetamine -dextroamphetamine  (ADDERALL) 20 MG tablet Take 30 mg by mouth every morning.     butalbital -apap-caffeine -codeine  (FIORICET  WITH CODEINE ) 50-325-40-30 MG capsule TAKE 1 CAP EVERY 12 HRS ONLY AS NEEDED FOR SEVERE MIGRAINE, NEVER MIX WITH CLONAZEPAM ,ZOLPIDEM      clonazePAM  (KLONOPIN ) 0.5 MG tablet Take 1 tablet (0.5 mg total) by mouth 4 (four) times daily. 120 tablet 1   loratadine  (CLARITIN ) 10 MG tablet Take 1 tablet (10 mg total) by mouth daily. 30 tablet 1   omeprazole (PRILOSEC OTC) 20 MG tablet Take 20 mg by mouth daily.     ondansetron  (ZOFRAN ) 4 MG/5ML solution Take by mouth once.     pantoprazole  (PROTONIX ) 40 MG tablet Take 1 tablet (40 mg total) by mouth daily. 30 tablet 1   QUEtiapine  (SEROQUEL ) 300 MG tablet Take 1 tablet (300 mg total) by mouth at bedtime. 30 tablet 1   venlafaxine  XR (EFFEXOR -XR) 150 MG 24 hr capsule Take 2 capsules (300 mg total) by mouth daily with breakfast. 60 capsule 1   venlafaxine  XR (EFFEXOR -XR) 75 MG 24 hr capsule Take 1 capsule (75 mg total) by mouth daily with breakfast. 30 capsule 1   ARMOUR THYROID  60 MG tablet Take 1 tablet (60 mg total) by mouth daily. (Patient not taking: Reported on 03/19/2023) 30 tablet 1   levothyroxine  (SYNTHROID ) 100 MCG tablet Take 1 tablet (100 mcg total) by mouth daily at 6 (six) AM. (Patient not taking: Reported on 03/19/2023) 30 tablet 1   levothyroxine  (SYNTHROID ) 50 MCG tablet TAKE 2 TABLETS BY MOUTH DAILY. (Patient not taking: Reported on 03/19/2023) 60 tablet 0   No current facility-administered medications for this visit.    OBJECTIVE: Vitals:   03/19/23 1407  BP: 97/61  Pulse: 78  Resp: 18  Temp: 97.9 F (36.6 C)  SpO2: 100%     Body mass index is 21.13 kg/m.      General: Well-developed, well-nourished, no acute distress. Eyes: Pink conjunctiva, anicteric sclera. HEENT: Normocephalic, moist mucous membranes, clear oropharnyx. Lungs: Clear to auscultation  bilaterally. Heart: Regular rate and rhythm. No rubs, murmurs, or gallops. Abdomen: Soft, nontender, nondistended. No organomegaly noted, normoactive bowel sounds. Musculoskeletal: No edema, cyanosis, or clubbing. Neuro: Alert, answering all questions appropriately. Cranial nerves grossly intact. Skin: No rashes or petechiae noted. Psych: Normal affect. Lymphatics: No cervical, calvicular, axillary or inguinal LAD.   LAB RESULTS:  Lab Results  Component Value Date   NA 139 07/05/2019   K 3.5 07/05/2019   CL 104 07/05/2019   CO2 24 07/05/2019   GLUCOSE 109 (H) 07/05/2019   BUN 6 07/05/2019   CREATININE 0.46 07/05/2019   CALCIUM 9.0 07/05/2019   PROT 7.8 07/05/2019   ALBUMIN 4.2 07/05/2019   AST 18 07/05/2019   ALT 18 07/05/2019   ALKPHOS  101 07/05/2019   BILITOT 0.5 07/05/2019   GFRNONAA >60 07/05/2019   GFRAA >60 07/05/2019    Lab Results  Component Value Date   WBC 10.4 07/05/2019   NEUTROABS 5.8 12/11/2016   HGB 12.9 07/05/2019   HCT 38.2 07/05/2019   MCV 92.3 07/05/2019   PLT 389 07/05/2019    No results found for: TIBC, FERRITIN, IRONPCTSAT   STUDIES: No results found.  ASSESSMENT AND PLAN:   Heather King is a 43 y.o. female with pmh of hepatocellular cancer, cervical cancer status post hysterectomy in 2013, Huntington's disease, GERD, depression referred to hematology as a transfer of care for leukocytosis thrombocytosis.  # History of leukocytosis and thrombocytosis - Patient was following with Dr.Dejuania Delores at [facility] in [place] for leukocytosis and thrombocytosis.  Was referred in May 2023.  Of note, flow cytometry was unremarkable.  JAK2 V6 56F/ CALR/ MPL/ exon 12-15, BCR-ABL negative.  Deemed likely reactive process.  Last seen on 02/12/2022 which showed WBC of 7.8, hemoglobin 12.5 and platelets of 349.  -This was deemed likely secondary to reactive process follow-up with PCP was advised. -She is now moved to [place] and would like to reestablish care with a hematologist in the area.  Will obtain CBC and CMP  # History of HCC -No records available.  Patient cannot recall the cancer center she was treated at.  Tells me it was 2 years ago and she received IV treatment.  She was told she is in remission and needs surveillance scans which should be due soon.  She is going to reach out to her husband to find out the name of the cancer center.  She also thinks she may have some records from there and if she will drop it off to our office.  I have given her my card with the clinic information and also provided option to reach out to us  via MyChart to provide information about the cancer center so we can obtain the records.  # Nausea vomiting -Referral to GI  # Weight loss -She was worked up with CT of the chest abdomen pelvis which did not show any acute abnormalities of malignancy adenoid OB/GYN 2023 since the weight loss started.  # Encounter for screening mammogram -Her last mammogram was 2 years ago.  She is still looking to establish with PCP.  Requesting if we can have a screening mammogram arranged.  Will schedule.  # Depression/anxiety -On Klonopin  and venlafaxine .  # Huntington's disease -Reports she previously followed with [facility] neurology.  She will reach out to them again now she has moved back.  Orders Placed This Encounter  Procedures   MM 3D SCREENING MAMMOGRAM BILATERAL BREAST   CBC with Differential (Cancer Center Only)   CMP (Cancer Center only)   CBC with Differential/Platelet   Comprehensive metabolic panel   Ambulatory referral to Gastroenterology   Follow-up in 3 months for MD visit, labs  Patient expressed understanding and was in agreement with this plan. She also understands that She can call clinic at any time with any questions, concerns, or complaints.   I spent a total of 45 minutes reviewing chart data, face-to-face evaluation with the patient, counseling and  coordination of care as detailed above.  Heather Dapolito, MD   03/19/2023 2:48 PM
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
## 2023-03-23 ENCOUNTER — Inpatient Hospital Stay: Payer: Medicare HMO

## 2023-03-23 NOTE — Progress Notes (Signed)
 [facility] Clinical Social Work  Initial Assessment   Alvina Desarai Wickes is a 43 y.o. year old female contacted by phone. Clinical Social Work was referred by medical provider for assessment of psychosocial needs.   SDOH (Social Determinants of Health) assessments performed: Yes SDOH Interventions    Flowsheet Row Office Visit from 08/16/2015 in [facility]  SDOH Interventions   Depression Interventions/Treatment  Referral to Psychiatry, Currently on Treatment       SDOH Screenings   Food Insecurity: Food Insecurity Present (03/19/2023)  Housing: Unknown (03/19/2023)  Transportation Needs: Unmet Transportation Needs (03/19/2023)  Utilities: At Risk (03/19/2023)  Alcohol Screen: Low Risk  (07/06/2019)  Depression (PHQ2-9): Low Risk  (03/19/2023)  Financial Resource Strain: Low Risk  (06/25/2017)  Physical Activity: Unknown (06/25/2017)  Stress: Stress Concern Present (06/25/2017)  Tobacco Use: Low Risk  (03/19/2023)     Distress Screen completed: No     No data to display            Family/Social Information:  Housing Arrangement: patient lives with her husband. Family members/support persons in your life? Family Transportation concerns: yes  Employment: Legally disabled Income source: Secretary/administrator concerns: Yes, current concerns Type of concern: Leisure centre manager, and Designer, industrial/product access concerns: yes Religious or spiritual practice: Yes-Patient identifies as Regulatory affairs officer: No Services Currently in place:  Medicaid and Medicare  Coping/ Adjustment to diagnosis: Patient understands treatment plan and what happens next? yes Concerns about diagnosis and/or treatment:  No Patient reported stressors: Therapist, art and/or priorities: Family Patient enjoys time with family/ friends Current coping skills/ strengths: Average or above average intelligence , Capable of independent living , Communication skills , and  Supportive family/friends     SUMMARY: Current SDOH Barriers:  Financial constraints related to fixed income.  Clinical Social Work Clinical Goal(s):  Explore community resource options for unmet needs related to:  Financial Strain   Interventions: Discussed common feeling and emotions when being diagnosed with cancer, and the importance of support during treatment Informed patient of the support team roles and support services at [facility] Provided CSW contact information and encouraged patient to call with any questions or concerns Provided patient with information about CSW role.  Securely emailed her [employer].  Her husband was laid off and is trying to secure employment.   Follow Up Plan: Patient will contact CSW with any support or resource needs Patient verbalizes understanding of plan: Yes    Kennth Peal, LCSW Clinical Social Worker [facility]  Patient is participating in a Managed Medicaid Plan:  Yes

## 2023-03-25 ENCOUNTER — Ambulatory Visit
Admission: RE | Admit: 2023-03-25 | Discharge: 2023-03-25 | Disposition: A | Discharge status: Home / Self Care | Payer: Medicare HMO | Source: Ambulatory Visit | Attending: Internal Medicine | Admitting: Internal Medicine

## 2023-03-25 DIAGNOSIS — Z1231 Encounter for screening mammogram for malignant neoplasm of breast: Secondary | ICD-10-CM | POA: Diagnosis present

## 2023-06-16 ENCOUNTER — Encounter: Payer: Self-pay | Admitting: Internal Medicine

## 2023-06-16 ENCOUNTER — Inpatient Hospital Stay: Payer: Medicare HMO | Admitting: Internal Medicine

## 2023-06-16 ENCOUNTER — Inpatient Hospital Stay: Payer: Medicare HMO | Attending: Internal Medicine

## 2023-06-30 ENCOUNTER — Encounter (INDEPENDENT_AMBULATORY_CARE_PROVIDER_SITE_OTHER): Payer: Self-pay

## 2023-07-01 ENCOUNTER — Telehealth: Payer: Self-pay | Admitting: *Deleted

## 2023-07-01 NOTE — Telephone Encounter (Signed)
 Patient calling back saying that she missed her labs encounter appointment.  When I went through it it looks like it was also with MD appointment to.  Patient is an [facility] patient.  She will be needing appointment for seeing the doctor and an appointment for the lab on the same day.  Says just send her a message or call her with the new lab and doctor appointment

## 2023-07-14 ENCOUNTER — Other Ambulatory Visit: Payer: Self-pay | Admitting: *Deleted

## 2023-07-14 DIAGNOSIS — D72829 Elevated white blood cell count, unspecified: Secondary | ICD-10-CM

## 2023-07-15 ENCOUNTER — Inpatient Hospital Stay (HOSPITAL_BASED_OUTPATIENT_CLINIC_OR_DEPARTMENT_OTHER): Admitting: Oncology

## 2023-07-15 ENCOUNTER — Inpatient Hospital Stay: Attending: Internal Medicine

## 2023-07-15 ENCOUNTER — Encounter: Payer: Self-pay | Admitting: Oncology

## 2023-07-15 VITALS — BP 109/79 | HR 87 | Temp 98.0°F | Resp 16 | Ht 65.0 in | Wt 124.8 lb

## 2023-07-15 DIAGNOSIS — D72829 Elevated white blood cell count, unspecified: Secondary | ICD-10-CM | POA: Insufficient documentation

## 2023-07-15 DIAGNOSIS — Z803 Family history of malignant neoplasm of breast: Secondary | ICD-10-CM | POA: Diagnosis not present

## 2023-07-15 DIAGNOSIS — G10 Huntington's disease: Secondary | ICD-10-CM | POA: Insufficient documentation

## 2023-07-15 DIAGNOSIS — Z801 Family history of malignant neoplasm of trachea, bronchus and lung: Secondary | ICD-10-CM | POA: Diagnosis not present

## 2023-07-15 LAB — CBC WITH DIFFERENTIAL/PLATELET
Abs Immature Granulocytes: 0.02 10*3/uL (ref 0.00–0.07)
Basophils Absolute: 0 10*3/uL (ref 0.0–0.1)
Basophils Relative: 1 %
Eosinophils Absolute: 0.1 10*3/uL (ref 0.0–0.5)
Eosinophils Relative: 2 %
HCT: 37.1 % (ref 36.0–46.0)
Hemoglobin: 12.4 g/dL (ref 12.0–15.0)
Immature Granulocytes: 0 %
Lymphocytes Relative: 29 %
Lymphs Abs: 1.9 10*3/uL (ref 0.7–4.0)
MCH: 31.5 pg (ref 26.0–34.0)
MCHC: 33.4 g/dL (ref 30.0–36.0)
MCV: 94.2 fL (ref 80.0–100.0)
Monocytes Absolute: 0.4 10*3/uL (ref 0.1–1.0)
Monocytes Relative: 7 %
Neutro Abs: 4.2 10*3/uL (ref 1.7–7.7)
Neutrophils Relative %: 61 %
Platelets: 374 10*3/uL (ref 150–400)
RBC: 3.94 MIL/uL (ref 3.87–5.11)
RDW: 13 % (ref 11.5–15.5)
WBC: 6.7 10*3/uL (ref 4.0–10.5)
nRBC: 0 % (ref 0.0–0.2)

## 2023-07-15 LAB — CMP (CANCER CENTER ONLY)
ALT: 22 U/L (ref 0–44)
AST: 18 U/L (ref 15–41)
Albumin: 4.4 g/dL (ref 3.5–5.0)
Alkaline Phosphatase: 102 U/L (ref 38–126)
Anion gap: 8 (ref 5–15)
BUN: 14 mg/dL (ref 6–20)
CO2: 26 mmol/L (ref 22–32)
Calcium: 8.9 mg/dL (ref 8.9–10.3)
Chloride: 102 mmol/L (ref 98–111)
Creatinine: 0.81 mg/dL (ref 0.44–1.00)
GFR, Estimated: >60 mL/min (ref >60)
Glucose, Bld: 90 mg/dL (ref 70–99)
Potassium: 3.3 mmol/L — ABNORMAL LOW (ref 3.5–5.1)
Sodium: 136 mmol/L (ref 135–145)
Total Bilirubin: 0.6 mg/dL (ref 0.0–1.2)
Total Protein: 7.4 g/dL (ref 6.5–8.1)

## 2023-07-15 NOTE — Progress Notes (Signed)
 Has noticed the last 2 weeks has been having a low grade fever of 99-100 in the evenings.

## 2023-07-15 NOTE — Progress Notes (Signed)
 [facility]  Telephone:(336) [phone] Fax:(336) [phone]  ID: Heather King OB: 1980-11-21  MR#: [id number]  AOZ#:[id number]  Patient Care Team: Patient, No Pcp Per as PCP - General (General Practice) Lehman Pummel, MD as Referring Physician (Neurology) Bufford Carne, MD as Referring Physician (Neurology) Velva Gibbons, FNP (Pain Medicine) Michaela Adie, MD (Psychiatry) Shellie Dials, MD as Consulting Physician (Oncology)  CHIEF COMPLAINT: Leukocytosis.  INTERVAL HISTORY: Patient previously seen by another provider.  She returns to clinic today for repeat laboratory work and further evaluation.  She currently feels well and at her baseline.  She has no new neurologic complaints.  She denies any recent fevers or illnesses.  She has a good appetite and denies weight loss.  She has no chest pain, shortness of breath, cough, or hemoptysis.  She denies any nausea, vomiting, constipation, or diarrhea.  She has no urinary complaints.  Patient offers no specific complaints today.  REVIEW OF SYSTEMS:   Review of Systems  Constitutional: Negative.  Negative for fever, malaise/fatigue and weight loss.  Respiratory: Negative.  Negative for cough, hemoptysis and shortness of breath.   Cardiovascular: Negative.  Negative for chest pain and leg swelling.  Gastrointestinal: Negative.  Negative for abdominal pain.  Genitourinary: Negative.  Negative for dysuria.  Musculoskeletal: Negative.  Negative for back pain.  Skin: Negative.   Neurological: Negative.  Negative for dizziness, focal weakness, weakness and headaches.  Psychiatric/Behavioral: Negative.  The patient is not nervous/anxious.     As per HPI. Otherwise, a complete review of systems is negative.  PAST MEDICAL HISTORY: Past Medical History:  Diagnosis Date   ADD (attention deficit disorder)    Anxiety    Bipolar disorder (HCC)    Cancer (HCC)    Cervical Cancer   Depression     GERD (gastroesophageal reflux disease)    Huntington's disease (HCC)    Insomnia due to medical condition     PAST SURGICAL HISTORY: Past Surgical History:  Procedure Laterality Date   ABDOMINAL HYSTERECTOMY  2013   CHOLECYSTECTOMY  2012   LEEP  2007    FAMILY HISTORY: Family History  Problem Relation Age of Onset   Hypertension Mother    Diabetes Mother    Mental illness Mother    Depression Mother    Cancer Father        lung   Alcohol abuse Father    Dementia Maternal Grandmother    Cancer Maternal Grandmother        breast   COPD Maternal Grandmother    Depression Maternal Grandmother    Dementia Maternal Grandfather    Cancer Maternal Grandfather        Lung   Diabetes Brother    ADD / ADHD Brother    Breast cancer Maternal Aunt 42    ADVANCED DIRECTIVES (Y/N):  N  HEALTH MAINTENANCE: Social History   Tobacco Use   Smoking status: Never   Smokeless tobacco: Never  Vaping Use   Vaping status: Never Used  Substance Use Topics   Alcohol use: Yes    Comment: occasional   Drug use: No     Colonoscopy:  PAP:  Bone density:  Lipid panel:  Allergies  Allergen Reactions   Abilify  [Aripiprazole ] Other (See Comments)    akathisia   Gabapentin Rash   Nifedipine Rash    To cream    Current Outpatient Medications  Medication Sig Dispense Refill   albuterol  (VENTOLIN  HFA) 108 (90 Base) MCG/ACT inhaler  Inhale 2 puffs into the lungs every 6 (six) hours as needed for wheezing or shortness of breath. 8 g 0   amphetamine -dextroamphetamine  (ADDERALL) 20 MG tablet Take 30 mg by mouth every morning.     butalbital -apap-caffeine -codeine  (FIORICET  WITH CODEINE ) 50-325-40-30 MG capsule TAKE 1 CAP EVERY 12 HRS ONLY AS NEEDED FOR SEVERE MIGRAINE, NEVER MIX WITH CLONAZEPAM ,ZOLPIDEM      clonazePAM  (KLONOPIN ) 0.5 MG tablet Take 1 tablet (0.5 mg total) by mouth 4 (four) times daily. 120 tablet 1   loratadine  (CLARITIN ) 10 MG tablet Take 1 tablet (10 mg total) by mouth  daily. 30 tablet 1   omeprazole (PRILOSEC OTC) 20 MG tablet Take 20 mg by mouth daily.     ondansetron  (ZOFRAN ) 4 MG/5ML solution Take by mouth once.     pantoprazole  (PROTONIX ) 40 MG tablet Take 1 tablet (40 mg total) by mouth daily. 30 tablet 1   QUEtiapine  (SEROQUEL ) 300 MG tablet Take 1 tablet (300 mg total) by mouth at bedtime. 30 tablet 1   venlafaxine  XR (EFFEXOR -XR) 150 MG 24 hr capsule Take 2 capsules (300 mg total) by mouth daily with breakfast. 60 capsule 1   venlafaxine  XR (EFFEXOR -XR) 75 MG 24 hr capsule Take 1 capsule (75 mg total) by mouth daily with breakfast. 30 capsule 1   ARMOUR THYROID  60 MG tablet Take 1 tablet (60 mg total) by mouth daily. (Patient not taking: Reported on 07/15/2023) 30 tablet 1   levothyroxine  (SYNTHROID ) 100 MCG tablet Take 1 tablet (100 mcg total) by mouth daily at 6 (six) AM. (Patient not taking: Reported on 07/15/2023) 30 tablet 1   levothyroxine  (SYNTHROID ) 50 MCG tablet TAKE 2 TABLETS BY MOUTH DAILY. (Patient not taking: Reported on 07/15/2023) 60 tablet 0   No current facility-administered medications for this visit.    OBJECTIVE: Vitals:   07/15/23 1317  BP: 109/79  Pulse: 87  Resp: 16  Temp: 98 F (36.7 C)  SpO2: 100%     Body mass index is 20.77 kg/m.    ECOG FS:0 - Asymptomatic  General: Well-developed, well-nourished, no acute distress. Eyes: Pink conjunctiva, anicteric sclera. HEENT: Normocephalic, moist mucous membranes. Lungs: No audible wheezing or coughing. Heart: Regular rate and rhythm. Abdomen: Soft, nontender, no obvious distention. Musculoskeletal: No edema, cyanosis, or clubbing. Neuro: Alert, answering all questions appropriately. Cranial nerves grossly intact. Skin: No rashes or petechiae noted. Psych: Normal affect. Lymphatics: No cervical, calvicular, axillary or inguinal LAD.   LAB RESULTS:  Lab Results  Component Value Date   NA 136 07/15/2023   K 3.3 (L) 07/15/2023   CL 102 07/15/2023   CO2 26 07/15/2023    GLUCOSE 90 07/15/2023   BUN 14 07/15/2023   CREATININE 0.81 07/15/2023   CALCIUM 8.9 07/15/2023   PROT 7.4 07/15/2023   ALBUMIN 4.4 07/15/2023   AST 18 07/15/2023   ALT 22 07/15/2023   ALKPHOS 102 07/15/2023   BILITOT 0.6 07/15/2023   GFRNONAA >60 07/15/2023   GFRAA >60 07/05/2019    Lab Results  Component Value Date   WBC 6.7 07/15/2023   NEUTROABS 4.2 07/15/2023   HGB 12.4 07/15/2023   HCT 37.1 07/15/2023   MCV 94.2 07/15/2023   PLT 374 07/15/2023     STUDIES: No results found.  ASSESSMENT: Leukocytosis.  PLAN:    Leukocytosis: Resolved.  Patient's total white blood cell count continues to be within normal limits.  Previously at [facility] entire workup was reported as negative.  No intervention is needed.  Patient  does not require bone marrow biopsy.  No follow-up has been scheduled.  Please refer patient back if there are any questions or concerns. History of HCC: Patient reports she received treatment several years ago and has been in complete remission since that time. Huntington's disease: Continue follow-up with [facility] neurolog[facility] as scheduled.  Patient expressed understanding and was in agreement with this plan. She also understands that She can call clinic at any time with any questions, concerns, or complaints.     Shellie Dials, MD   07/15/2023 2:56 PM
